# Patient Record
Sex: Male | Born: 1960
Health system: Southern US, Community
[De-identification: ages and names within clinical notes are randomized; demographics above are authoritative.]

## PROBLEM LIST (undated history)

## (undated) DIAGNOSIS — F1910 Other psychoactive substance abuse, uncomplicated: Secondary | ICD-10-CM

## (undated) DIAGNOSIS — E785 Hyperlipidemia, unspecified: Secondary | ICD-10-CM

## (undated) DIAGNOSIS — F39 Unspecified mood [affective] disorder: Secondary | ICD-10-CM

## (undated) DIAGNOSIS — K219 Gastro-esophageal reflux disease without esophagitis: Secondary | ICD-10-CM

## (undated) DIAGNOSIS — E119 Type 2 diabetes mellitus without complications: Secondary | ICD-10-CM

## (undated) DIAGNOSIS — M722 Plantar fascial fibromatosis: Secondary | ICD-10-CM

## (undated) DIAGNOSIS — L719 Rosacea, unspecified: Secondary | ICD-10-CM

## (undated) DIAGNOSIS — I82409 Acute embolism and thrombosis of unspecified deep veins of unspecified lower extremity: Secondary | ICD-10-CM

## (undated) DIAGNOSIS — E079 Disorder of thyroid, unspecified: Secondary | ICD-10-CM

## (undated) DIAGNOSIS — I10 Essential (primary) hypertension: Secondary | ICD-10-CM

## (undated) HISTORY — DX: Rosacea, unspecified: L71.9

## (undated) HISTORY — DX: Other psychoactive substance abuse, uncomplicated: F19.10

## (undated) HISTORY — DX: Hyperlipidemia, unspecified: E78.5

## (undated) HISTORY — DX: Plantar fascial fibromatosis: M72.2

## (undated) HISTORY — DX: Disorder of thyroid, unspecified: E07.9

## (undated) HISTORY — DX: Unspecified mood (affective) disorder: F39

## (undated) HISTORY — DX: Type 2 diabetes mellitus without complications: E11.9

## (undated) HISTORY — DX: Acute embolism and thrombosis of unspecified deep veins of unspecified lower extremity: I82.409

## (undated) HISTORY — DX: Gastro-esophageal reflux disease without esophagitis: K21.9

## (undated) HISTORY — DX: Essential (primary) hypertension: I10

---

## 2010-02-03 ENCOUNTER — Emergency Department (HOSPITAL_COMMUNITY): Admission: EM | Admit: 2010-02-03 | Discharge: 2010-02-03 | Payer: Self-pay | Admitting: Family Medicine

## 2010-02-04 ENCOUNTER — Inpatient Hospital Stay (HOSPITAL_COMMUNITY): Admission: EM | Admit: 2010-02-04 | Discharge: 2010-02-09 | Payer: Self-pay | Admitting: Emergency Medicine

## 2011-03-20 LAB — GLUCOSE, CAPILLARY
Glucose-Capillary: 108 mg/dL — ABNORMAL HIGH (ref 70–99)
Glucose-Capillary: 110 mg/dL — ABNORMAL HIGH (ref 70–99)
Glucose-Capillary: 115 mg/dL — ABNORMAL HIGH (ref 70–99)
Glucose-Capillary: 120 mg/dL — ABNORMAL HIGH (ref 70–99)
Glucose-Capillary: 122 mg/dL — ABNORMAL HIGH (ref 70–99)
Glucose-Capillary: 123 mg/dL — ABNORMAL HIGH (ref 70–99)
Glucose-Capillary: 123 mg/dL — ABNORMAL HIGH (ref 70–99)
Glucose-Capillary: 133 mg/dL — ABNORMAL HIGH (ref 70–99)
Glucose-Capillary: 133 mg/dL — ABNORMAL HIGH (ref 70–99)
Glucose-Capillary: 134 mg/dL — ABNORMAL HIGH (ref 70–99)
Glucose-Capillary: 142 mg/dL — ABNORMAL HIGH (ref 70–99)
Glucose-Capillary: 147 mg/dL — ABNORMAL HIGH (ref 70–99)
Glucose-Capillary: 149 mg/dL — ABNORMAL HIGH (ref 70–99)
Glucose-Capillary: 153 mg/dL — ABNORMAL HIGH (ref 70–99)
Glucose-Capillary: 163 mg/dL — ABNORMAL HIGH (ref 70–99)
Glucose-Capillary: 170 mg/dL — ABNORMAL HIGH (ref 70–99)

## 2011-03-20 LAB — COMPREHENSIVE METABOLIC PANEL
ALT: 207 U/L — ABNORMAL HIGH (ref 0–53)
AST: 100 U/L — ABNORMAL HIGH (ref 0–37)
AST: 153 U/L — ABNORMAL HIGH (ref 0–37)
AST: 80 U/L — ABNORMAL HIGH (ref 0–37)
Albumin: 2.3 g/dL — ABNORMAL LOW (ref 3.5–5.2)
Albumin: 2.4 g/dL — ABNORMAL LOW (ref 3.5–5.2)
Alkaline Phosphatase: 146 U/L — ABNORMAL HIGH (ref 39–117)
Alkaline Phosphatase: 280 U/L — ABNORMAL HIGH (ref 39–117)
BUN: 4 mg/dL — ABNORMAL LOW (ref 6–23)
BUN: 6 mg/dL (ref 6–23)
CO2: 24 mEq/L (ref 19–32)
CO2: 26 mEq/L (ref 19–32)
Calcium: 8.7 mg/dL (ref 8.4–10.5)
Chloride: 102 mEq/L (ref 96–112)
Chloride: 96 mEq/L (ref 96–112)
Chloride: 98 mEq/L (ref 96–112)
Creatinine, Ser: 0.83 mg/dL (ref 0.4–1.5)
Creatinine, Ser: 0.84 mg/dL (ref 0.4–1.5)
Creatinine, Ser: 1.66 mg/dL — ABNORMAL HIGH (ref 0.4–1.5)
GFR calc Af Amer: >60 mL/min (ref >60)
GFR calc non Af Amer: 49 mL/min — ABNORMAL LOW (ref >60)
Glucose, Bld: 133 mg/dL — ABNORMAL HIGH (ref 70–99)
Potassium: 3.8 mEq/L (ref 3.5–5.1)
Potassium: 3.8 mEq/L (ref 3.5–5.1)
Sodium: 131 mEq/L — ABNORMAL LOW (ref 135–145)
Total Bilirubin: 0.7 mg/dL (ref 0.3–1.2)
Total Bilirubin: 1.5 mg/dL — ABNORMAL HIGH (ref 0.3–1.2)
Total Protein: 6.3 g/dL (ref 6.0–8.3)
Total Protein: 6.7 g/dL (ref 6.0–8.3)

## 2011-03-20 LAB — GAMMA GT: GGT: 462 U/L — ABNORMAL HIGH (ref 7–51)

## 2011-03-20 LAB — URINALYSIS, ROUTINE W REFLEX MICROSCOPIC
Glucose, UA: NEGATIVE mg/dL
Ketones, ur: NEGATIVE mg/dL
Nitrite: NEGATIVE
Protein, ur: 100 mg/dL — AB
Specific Gravity, Urine: 1.018 (ref 1.005–1.030)
Urobilinogen, UA: 1 mg/dL (ref 0.0–1.0)
Urobilinogen, UA: 1 mg/dL (ref 0.0–1.0)
pH: 5.5 (ref 5.0–8.0)

## 2011-03-20 LAB — CULTURE, BLOOD (ROUTINE X 2)

## 2011-03-20 LAB — CBC
HCT: 27.9 % — ABNORMAL LOW (ref 39.0–52.0)
HCT: 27.9 % — ABNORMAL LOW (ref 39.0–52.0)
Hemoglobin: 10.2 g/dL — ABNORMAL LOW (ref 13.0–17.0)
Hemoglobin: 10.6 g/dL — ABNORMAL LOW (ref 13.0–17.0)
Hemoglobin: 13.2 g/dL (ref 13.0–17.0)
MCHC: 34.3 g/dL (ref 30.0–36.0)
MCHC: 34.5 g/dL (ref 30.0–36.0)
MCV: 95.9 fL (ref 78.0–100.0)
MCV: 96.2 fL (ref 78.0–100.0)
Platelets: 112 10*3/uL — ABNORMAL LOW (ref 150–400)
Platelets: 152 10*3/uL (ref 150–400)
Platelets: 255 10*3/uL (ref 150–400)
Platelets: 293 10*3/uL (ref 150–400)
RBC: 3.21 MIL/uL — ABNORMAL LOW (ref 4.22–5.81)
RBC: 3.97 MIL/uL — ABNORMAL LOW (ref 4.22–5.81)
RDW: 14.2 % (ref 11.5–15.5)
RDW: 14.4 % (ref 11.5–15.5)
RDW: 14.4 % (ref 11.5–15.5)
RDW: 14.6 % (ref 11.5–15.5)
WBC: 10.3 10*3/uL (ref 4.0–10.5)
WBC: 14.8 10*3/uL — ABNORMAL HIGH (ref 4.0–10.5)
WBC: 8.1 10*3/uL (ref 4.0–10.5)
WBC: 8.1 10*3/uL (ref 4.0–10.5)

## 2011-03-20 LAB — URINE MICROSCOPIC-ADD ON

## 2011-03-20 LAB — HEMOGLOBIN A1C
Hgb A1c MFr Bld: 7.4 % — ABNORMAL HIGH (ref 4.6–6.1)
Mean Plasma Glucose: 166 mg/dL

## 2011-03-20 LAB — LIPID PANEL
Cholesterol: 141 mg/dL (ref 0–200)
LDL Cholesterol: 73 mg/dL (ref 0–99)
Total CHOL/HDL Ratio: 3.1 RATIO
Triglycerides: 109 mg/dL (ref <150)
Triglycerides: 87 mg/dL (ref <150)
VLDL: 17 mg/dL (ref 0–40)
VLDL: 22 mg/dL (ref 0–40)

## 2011-03-20 LAB — CERULOPLASMIN: Ceruloplasmin: 51 mg/dL (ref 21–63)

## 2011-03-20 LAB — BASIC METABOLIC PANEL
BUN: 6 mg/dL (ref 6–23)
Calcium: 8.8 mg/dL (ref 8.4–10.5)
Calcium: 9.1 mg/dL (ref 8.4–10.5)
GFR calc Af Amer: >60 mL/min (ref >60)
GFR calc non Af Amer: >60 mL/min (ref >60)
GFR calc non Af Amer: >60 mL/min (ref >60)
Glucose, Bld: 128 mg/dL — ABNORMAL HIGH (ref 70–99)
Sodium: 130 mEq/L — ABNORMAL LOW (ref 135–145)
Sodium: 132 mEq/L — ABNORMAL LOW (ref 135–145)

## 2011-03-20 LAB — URINE CULTURE: Culture: NO GROWTH

## 2011-03-20 LAB — AMYLASE: Amylase: 56 U/L (ref 0–105)

## 2011-03-20 LAB — TRYPSINOGEN, BLOOD

## 2011-03-20 LAB — HEPATIC FUNCTION PANEL
AST: 127 U/L — ABNORMAL HIGH (ref 0–37)
AST: 144 U/L — ABNORMAL HIGH (ref 0–37)
Albumin: 2.5 g/dL — ABNORMAL LOW (ref 3.5–5.2)
Albumin: 2.7 g/dL — ABNORMAL LOW (ref 3.5–5.2)
Total Bilirubin: 1.5 mg/dL — ABNORMAL HIGH (ref 0.3–1.2)
Total Protein: 7.5 g/dL (ref 6.0–8.3)

## 2011-03-20 LAB — T4, FREE: Free T4: 1.03 ng/dL (ref 0.80–1.80)

## 2011-03-20 LAB — DIFFERENTIAL
Lymphocytes Relative: 15 % (ref 12–46)
Monocytes Relative: 8 % (ref 3–12)
Neutro Abs: 11.4 10*3/uL — ABNORMAL HIGH (ref 1.7–7.7)
Neutrophils Relative %: 77 % (ref 43–77)

## 2011-03-20 LAB — FERRITIN: Ferritin: 1373 ng/mL — ABNORMAL HIGH (ref 22–322)

## 2011-03-20 LAB — LIPASE, BLOOD: Lipase: 35 U/L (ref 11–59)

## 2012-12-29 HISTORY — PX: PACEMAKER INSERTION: SHX728

## 2014-09-18 ENCOUNTER — Emergency Department (HOSPITAL_COMMUNITY)
Admission: EM | Admit: 2014-09-18 | Discharge: 2014-09-18 | Disposition: A | Discharge status: Home / Self Care | Payer: Medicaid Other | Attending: Emergency Medicine | Admitting: Emergency Medicine

## 2014-09-18 ENCOUNTER — Emergency Department (HOSPITAL_COMMUNITY): Payer: Medicaid Other

## 2014-09-18 DIAGNOSIS — Z9119 Patient's noncompliance with other medical treatment and regimen: Secondary | ICD-10-CM | POA: Insufficient documentation

## 2014-09-18 DIAGNOSIS — R0602 Shortness of breath: Secondary | ICD-10-CM | POA: Insufficient documentation

## 2014-09-18 DIAGNOSIS — Z7901 Long term (current) use of anticoagulants: Secondary | ICD-10-CM | POA: Insufficient documentation

## 2014-09-18 DIAGNOSIS — Z79899 Other long term (current) drug therapy: Secondary | ICD-10-CM | POA: Diagnosis not present

## 2014-09-18 DIAGNOSIS — Z9114 Patient's other noncompliance with medication regimen: Secondary | ICD-10-CM

## 2014-09-18 DIAGNOSIS — R7309 Other abnormal glucose: Secondary | ICD-10-CM | POA: Diagnosis not present

## 2014-09-18 DIAGNOSIS — I158 Other secondary hypertension: Secondary | ICD-10-CM | POA: Diagnosis not present

## 2014-09-18 DIAGNOSIS — I159 Secondary hypertension, unspecified: Secondary | ICD-10-CM

## 2014-09-18 DIAGNOSIS — Z91148 Patient's other noncompliance with medication regimen for other reason: Secondary | ICD-10-CM

## 2014-09-18 DIAGNOSIS — Z880 Allergy status to penicillin: Secondary | ICD-10-CM | POA: Insufficient documentation

## 2014-09-18 DIAGNOSIS — R739 Hyperglycemia, unspecified: Secondary | ICD-10-CM

## 2014-09-18 DIAGNOSIS — Z91199 Patient's noncompliance with other medical treatment and regimen due to unspecified reason: Secondary | ICD-10-CM | POA: Insufficient documentation

## 2014-09-18 DIAGNOSIS — Z794 Long term (current) use of insulin: Secondary | ICD-10-CM | POA: Diagnosis not present

## 2014-09-18 LAB — COMPREHENSIVE METABOLIC PANEL
ALK PHOS: 444 U/L — AB (ref 39–117)
ALT: 36 U/L (ref 0–53)
ANION GAP: 10 (ref 5–15)
AST: 47 U/L — ABNORMAL HIGH (ref 0–37)
Albumin: 3.3 g/dL — ABNORMAL LOW (ref 3.5–5.2)
BILIRUBIN TOTAL: 0.6 mg/dL (ref 0.3–1.2)
BUN: 9 mg/dL (ref 6–23)
CHLORIDE: 99 meq/L (ref 96–112)
CO2: 26 meq/L (ref 19–32)
Calcium: 8.8 mg/dL (ref 8.4–10.5)
Creatinine, Ser: 0.68 mg/dL (ref 0.50–1.35)
GFR calc non Af Amer: >90 mL/min (ref >90)
GLUCOSE: 241 mg/dL — AB (ref 70–99)
POTASSIUM: 3.6 meq/L — AB (ref 3.7–5.3)
Sodium: 135 mEq/L — ABNORMAL LOW (ref 137–147)
Total Protein: 6.9 g/dL (ref 6.0–8.3)

## 2014-09-18 LAB — URINALYSIS, ROUTINE W REFLEX MICROSCOPIC
Bilirubin Urine: NEGATIVE
HGB URINE DIPSTICK: NEGATIVE
KETONES UR: NEGATIVE mg/dL
Leukocytes, UA: NEGATIVE
Nitrite: NEGATIVE
PROTEIN: NEGATIVE mg/dL
Specific Gravity, Urine: 1.028 (ref 1.005–1.030)
Urobilinogen, UA: 0.2 mg/dL (ref 0.0–1.0)
pH: 6 (ref 5.0–8.0)

## 2014-09-18 LAB — CBC
HEMATOCRIT: 32.9 % — AB (ref 39.0–52.0)
HEMOGLOBIN: 11.6 g/dL — AB (ref 13.0–17.0)
MCH: 32.3 pg (ref 26.0–34.0)
MCHC: 35.3 g/dL (ref 30.0–36.0)
MCV: 91.6 fL (ref 78.0–100.0)
Platelets: 175 10*3/uL (ref 150–400)
RBC: 3.59 MIL/uL — AB (ref 4.22–5.81)
RDW: 13.6 % (ref 11.5–15.5)
WBC: 4.5 10*3/uL (ref 4.0–10.5)

## 2014-09-18 LAB — I-STAT TROPONIN, ED: Troponin i, poc: 0 ng/mL (ref 0.00–0.08)

## 2014-09-18 LAB — URINE MICROSCOPIC-ADD ON

## 2014-09-18 MED ORDER — BLOOD GLUCOSE METER KIT: PACK | Status: DC

## 2014-09-18 MED ORDER — INSULIN GLARGINE 100 UNIT/ML SOLOSTAR PEN: 25.0000 [IU] | PEN_INJECTOR | Freq: Every day | SUBCUTANEOUS | Status: DC

## 2014-09-18 MED ORDER — AMLODIPINE BESYLATE 2.5 MG PO TABS: 5.0000 mg | ORAL_TABLET | Freq: Every day | ORAL | Status: DC

## 2014-09-18 MED ORDER — METFORMIN HCL ER (MOD) 1000 MG PO TB24: 1000.0000 mg | ORAL_TABLET | Freq: Two times a day (BID) | ORAL | Status: DC

## 2014-09-18 MED ORDER — OXYCODONE-ACETAMINOPHEN 5-325 MG PO TABS
1.0000 | ORAL_TABLET | ORAL | Status: DC | PRN
Start: 2014-09-18 — End: 2014-09-25

## 2014-09-18 MED ORDER — MIRTAZAPINE 15 MG PO TABS: 7.5000 mg | ORAL_TABLET | Freq: Every day | ORAL | Status: DC

## 2014-09-18 MED ORDER — SODIUM CHLORIDE 0.9 % IV BOLUS (SEPSIS)
1000.0000 mL | Freq: Once | INTRAVENOUS | Status: AC
  Administered 2014-09-18: 1000 mL via INTRAVENOUS

## 2014-09-18 MED ORDER — SERTRALINE HCL 100 MG PO TABS: 100.0000 mg | ORAL_TABLET | Freq: Every day | ORAL | Status: DC

## 2014-09-18 MED ORDER — OMEGA 3 1000 MG PO CAPS: 2000.0000 mg | ORAL_CAPSULE | Freq: Every day | ORAL | Status: DC

## 2014-09-18 MED ORDER — INSULIN ASPART 100 UNIT/ML FLEXPEN: 20.0000 [IU] | PEN_INJECTOR | Freq: Three times a day (TID) | SUBCUTANEOUS | Status: DC

## 2014-09-18 MED ORDER — WARFARIN SODIUM 2 MG PO TABS: 6.0000 mg | ORAL_TABLET | Freq: Every day | ORAL | Status: DC

## 2014-09-18 MED ORDER — LOSARTAN POTASSIUM 50 MG PO TABS: 50.0000 mg | ORAL_TABLET | Freq: Every day | ORAL | Status: DC

## 2014-09-18 MED ORDER — VITAMIN D 1000 UNITS PO TABS: 2000.0000 [IU] | ORAL_TABLET | Freq: Every day | ORAL | Status: DC

## 2014-09-18 MED ORDER — LEVOTHYROXINE SODIUM 150 MCG PO TABS: 150.0000 ug | ORAL_TABLET | Freq: Every day | ORAL | Status: DC

## 2014-09-18 MED ORDER — OXYCODONE-ACETAMINOPHEN 5-325 MG PO TABS
1.0000 | ORAL_TABLET | Freq: Once | ORAL | Status: AC
  Administered 2014-09-18: 1 via ORAL
  Filled 2014-09-18: qty 1

## 2014-09-18 MED ORDER — ATORVASTATIN CALCIUM 40 MG PO TABS: 80.0000 mg | ORAL_TABLET | Freq: Every day | ORAL | Status: DC

## 2014-09-18 MED ORDER — GABAPENTIN 100 MG PO CAPS: 300.0000 mg | ORAL_CAPSULE | Freq: Three times a day (TID) | ORAL | Status: DC

## 2014-09-18 MED ORDER — FENOFIBRATE 145 MG PO TABS: 145.0000 mg | ORAL_TABLET | Freq: Every day | ORAL | Status: DC

## 2014-09-18 MED ORDER — OMEPRAZOLE 20 MG PO CPDR: 20.0000 mg | DELAYED_RELEASE_CAPSULE | Freq: Every day | ORAL | Status: DC

## 2014-09-18 NOTE — ED Notes (Signed)
Has misplaced his med x 1 month sob since yesterday , bp meds coumadin insulin

## 2014-09-18 NOTE — ED Notes (Signed)
Pt from home, recently relocated from [place] and airport lost pt luggage containing all documents and medications. Pt has not been able to refill any meds since 08/29/14. Pt also reports sob that started yesterday, states gets worse with exertion. Pt also reports left sided cp whenever dyspnea happens. No radiation, denies any n/v/d/fevers. nad noted. Pt ambulatory from triage with no difficulty. Axo x4.

## 2014-09-18 NOTE — Discharge Instructions (Signed)
Go to the [facility], now, to get your medications. Also, go to the [facility] on Monday at noon for her scheduled appointment with Dr.   Hyperglycemia Hyperglycemia occurs when the glucose (sugar) in your blood is too high. Hyperglycemia can happen for many reasons, but it most often happens to people who do not know they have diabetes or are not managing their diabetes properly.  CAUSES  Whether you have diabetes or not, there are other causes of hyperglycemia. Hyperglycemia can occur when you have diabetes, but it can also occur in other situations that you might not be as aware of, such as: Diabetes  If you have diabetes and are having problems controlling your blood glucose, hyperglycemia could occur because of some of the following reasons:  Not following your meal plan.  Not taking your diabetes medications or not taking it properly.  Exercising less or doing less activity than you normally do.  Being sick. Pre-diabetes  This cannot be ignored. Before people develop Type 2 diabetes, they almost always have "pre-diabetes." This is when your blood glucose levels are higher than normal, but not yet high enough to be diagnosed as diabetes. Research has shown that some long-term damage to the body, especially the heart and circulatory system, may already be occurring during pre-diabetes. If you take action to manage your blood glucose when you have pre-diabetes, you may delay or prevent Type 2 diabetes from developing. Stress  If you have diabetes, you may be "diet" controlled or on oral medications or insulin to control your diabetes. However, you may find that your blood glucose is higher than usual in the hospital whether you have diabetes or not. This is often referred to as "stress hyperglycemia." Stress can elevate your blood glucose. This happens because of hormones put out by the body during times of stress. If stress has been the cause of your high blood glucose, it can  be followed regularly by your caregiver. That way he/she can make sure your hyperglycemia does not continue to get worse or progress to diabetes. Steroids  Steroids are medications that act on the infection fighting system (immune system) to block inflammation or infection. One side effect can be a rise in blood glucose. Most people can produce enough extra insulin to allow for this rise, but for those who cannot, steroids make blood glucose levels go even higher. It is not unusual for steroid treatments to "uncover" diabetes that is developing. It is not always possible to determine if the hyperglycemia will go away after the steroids are stopped. A special blood test called an A1c is sometimes done to determine if your blood glucose was elevated before the steroids were started. SYMPTOMS  Thirsty.  Frequent urination.  Dry mouth.  Blurred vision.  Tired or fatigue.  Weakness.  Sleepy.  Tingling in feet or leg. DIAGNOSIS  Diagnosis is made by monitoring blood glucose in one or all of the following ways:  A1c test. This is a chemical found in your blood.  Fingerstick blood glucose monitoring.  Laboratory results. TREATMENT  First, knowing the cause of the hyperglycemia is important before the hyperglycemia can be treated. Treatment may include, but is not be limited to:  Education.  Change or adjustment in medications.  Change or adjustment in meal plan.  Treatment for an illness, infection, etc.  More frequent blood glucose monitoring.  Change in exercise plan.  Decreasing or stopping steroids.  Lifestyle changes. HOME CARE INSTRUCTIONS   Test your blood glucose  as directed.  Exercise regularly. Your caregiver will give you instructions about exercise. Pre-diabetes or diabetes which comes on with stress is helped by exercising.  Eat wholesome, balanced meals. Eat often and at regular, fixed times. Your caregiver or nutritionist will give you a meal plan to guide  your sugar intake.  Being at an ideal weight is important. If needed, losing as little as 10 to 15 pounds may help improve blood glucose levels. SEEK MEDICAL CARE IF:   You have questions about medicine, activity, or diet.  You continue to have symptoms (problems such as increased thirst, urination, or weight gain). SEEK IMMEDIATE MEDICAL CARE IF:   You are vomiting or have diarrhea.  Your breath smells fruity.  You are breathing faster or slower.  You are very sleepy or incoherent.  You have numbness, tingling, or pain in your feet or hands.  You have chest pain.  Your symptoms get worse even though you have been following your caregiver's orders.  If you have any other questions or concerns. Document Released: 06/10/2001 Document Revised: 03/08/2012 Document Reviewed: 04/12/2012 [facility] Patient Information 2015 [place], [place]. This information is not intended to replace advice given to you by your health care provider. Make sure you discuss any questions you have with your health care provider.  Hypertension Hypertension, commonly called high blood pressure, is when the force of blood pumping through your arteries is too strong. Your arteries are the blood vessels that carry blood from your heart throughout your body. A blood pressure reading consists of a higher number over a lower number, such as 110/72. The higher number (systolic) is the pressure inside your arteries when your heart pumps. The lower number (diastolic) is the pressure inside your arteries when your heart relaxes. Ideally you want your blood pressure below 120/80. Hypertension forces your heart to work harder to pump blood. Your arteries may become narrow or stiff. Having hypertension puts you at risk for heart disease, stroke, and other problems.  RISK FACTORS Some risk factors for high blood pressure are controllable. Others are not.  Risk factors you cannot control include:   Race. You may be at higher  risk if you are African American.  Age. Risk increases with age.  Gender. Men are at higher risk than women before age 85 years. After age 47, women are at higher risk than men. Risk factors you can control include:  Not getting enough exercise or physical activity.  Being overweight.  Getting too much fat, sugar, calories, or salt in your diet.  Drinking too much alcohol. SIGNS AND SYMPTOMS Hypertension does not usually cause signs or symptoms. Extremely high blood pressure (hypertensive crisis) may cause headache, anxiety, shortness of breath, and nosebleed. DIAGNOSIS  To check if you have hypertension, your health care provider will measure your blood pressure while you are seated, with your arm held at the level of your heart. It should be measured at least twice using the same arm. Certain conditions can cause a difference in blood pressure between your right and left arms. A blood pressure reading that is higher than normal on one occasion does not mean that you need treatment. If one blood pressure reading is high, ask your health care provider about having it checked again. TREATMENT  Treating high blood pressure includes making lifestyle changes and possibly taking medicine. Living a healthy lifestyle can help lower high blood pressure. You may need to change some of your habits. Lifestyle changes may include:  Following the [facility]  diet. This diet is high in fruits, vegetables, and whole grains. It is low in salt, red meat, and added sugars.  Getting at least 2 hours of brisk physical activity every week.  Losing weight if necessary.  Not smoking.  Limiting alcoholic beverages.  Learning ways to reduce stress. If lifestyle changes are not enough to get your blood pressure under control, your health care provider may prescribe medicine. You may need to take more than one. Work closely with your health care provider to understand the risks and benefits. HOME CARE  INSTRUCTIONS  Have your blood pressure rechecked as directed by your health care provider.   Take medicines only as directed by your health care provider. Follow the directions carefully. Blood pressure medicines must be taken as prescribed. The medicine does not work as well when you skip doses. Skipping doses also puts you at risk for problems.   Do not smoke.   Monitor your blood pressure at home as directed by your health care provider. SEEK MEDICAL CARE IF:   You think you are having a reaction to medicines taken.  You have recurrent headaches or feel dizzy.  You have swelling in your ankles.  You have trouble with your vision. SEEK IMMEDIATE MEDICAL CARE IF:  You develop a severe headache or confusion.  You have unusual weakness, numbness, or feel faint.  You have severe chest or abdominal pain.  You vomit repeatedly.  You have trouble breathing. MAKE SURE YOU:   Understand these instructions.  Will watch your condition.  Will get help right away if you are not doing well or get worse. Document Released: 12/15/2005 Document Revised: 05/01/2014 Document Reviewed: 10/07/2013 [facility] Patient Information 2015 [place], [place]. This information is not intended to replace advice given to you by your health care provider. Make sure you discuss any questions you have with your health care provider.

## 2014-09-18 NOTE — Progress Notes (Signed)
  CARE MANAGEMENT ED NOTE 09/18/2014  Patient:  Patrick Small, Patrick Small   Account Number:  [id number]  Date Initiated:  09/18/2014  Documentation initiated by:  Edd Arbour  Subjective/Objective Assessment:   53 yr old self [place] without insurance nor pcp c/o for evaluation of help getting medication. his medications, were lost, 08/29/2014, as he was traveling to [place]. Since then his Medicaid case has been     Subjective/Objective Assessment Detail:   closed in [place], where he lives and he is unable to get refills on his prescriptions.    seen by [facility] staff Refer to notes     Action/Plan:   cm contacted by EDP wentz who spoke with [facility] staff to provide services to pt   Action/Plan Detail:   Anticipated DC Date:  09/18/2014     Status Recommendation to Physician:   Result of Recommendation:    Other ED Services  Consult Working Plan    DC Planning Services  CM consult  Medication Assistance  [facility] / [facility] (established/new)    Choice offered to / List presented to:            Status of service:  Completed, signed off  ED Comments:   ED Comments Detail:

## 2014-09-18 NOTE — ED Provider Notes (Signed)
CSN: [id number]     Arrival date & time 09/18/14  1043 History   First MD Initiated Contact with Patient 09/18/14 1346     Chief Complaint  Patient presents with  . Shortness of Breath     (Consider location/radiation/quality/duration/timing/severity/associated sxs/prior Treatment) HPI  Patrick Small is a 53 y.o. male who presents for evaluation of help getting medication. He, states that his medications, were lost, 08/29/2014, as he was traveling to [place]. Since then his Medicaid case has been closed in [place], where he lives and he is unable to get refills on his prescriptions. He, states that he lives with his niece, and is working on Engineer, mining in [place]. Until that time. He has no access to care, and no way to get his medications. He is concerned about his chronic low back pain, for which he takes oxycodone. 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 He denies headaches, chest pain, weakness, dizziness, nausea, or vomiting. He is not taking any of his prescribed medications. No other known modifying factors.    No past medical history on file. No past surgical history on file. No family history on file. History  Substance Use Topics  . Smoking status: Not on file  . Smokeless tobacco: Not on file  . Alcohol Use: Not on file    Review of Systems  All other systems reviewed and are negative.     Allergies  Penicillins; Bacitracin; and Septra  Home Medications   Prior to Admission medications   Medication Sig Start Date End Date Taking? Authorizing Provider  lidocaine (LIDODERM) 5 % Place 1-3 patches onto the skin daily. Remove & Discard patch within 12 hours or as directed by MD   Yes Historical Provider, MD  Melatonin 3 MG TABS Take 6-9 mg by mouth at bedtime as needed (sleep).   Yes Historical Provider, MD  mometasone (ELOCON) 0.1 % cream Apply 1 application topically 2 (two) times daily as needed (inflammation).     Yes Historical Provider, MD  nicotine (NICOTROL) 10 MG inhaler Inhale 1 continuous puffing into the lungs as needed for smoking cessation.   Yes Historical Provider, MD  amLODipine (NORVASC) 2.5 MG tablet Take 2 tablets (5 mg total) by mouth daily. 09/18/14   Richarda Blade, MD  atorvastatin (LIPITOR) 40 MG tablet Take 2 tablets (80 mg total) by mouth daily. 09/18/14   Richarda Blade, MD  Blood Glucose Monitoring Suppl (BLOOD GLUCOSE METER KIT AND SUPPLIES) Dispense based on patient and insurance preference. Use up to four times daily as directed. (FOR ICD-9 250.00, 250.01). 09/18/14   Richarda Blade, MD  cholecalciferol (VITAMIN D) 1000 UNITS tablet Take 2 tablets (2,000 Units total) by mouth daily. 09/18/14   Richarda Blade, MD  fenofibrate (TRICOR) 145 MG tablet Take 1 tablet (145 mg total) by mouth daily. 09/18/14   Richarda Blade, MD  gabapentin (NEURONTIN) 100 MG capsule Take 3 capsules (300 mg total) by mouth 3 (three) times daily. 09/18/14   Richarda Blade, MD  insulin aspart (NOVOLOG) 100 UNIT/ML FlexPen Inject 20 Units into the skin 3 (three) times daily with meals. 09/18/14   Richarda Blade, MD  Insulin Glargine (LANTUS SOLOSTAR) 100 UNIT/ML Solostar Pen Inject 25 Units into the skin daily. 09/18/14   Richarda Blade, MD  levothyroxine (SYNTHROID, LEVOTHROID) 150 MCG tablet Take 1 tablet (150 mcg total) by mouth daily before breakfast. 09/18/14   Richarda Blade, MD  losartan (COZAAR) 50 MG tablet Take 1 tablet (50 mg total) by mouth daily. 09/18/14   Richarda Blade, MD  metFORMIN (GLUMETZA) 1000 MG (MOD) 24 hr tablet Take 1 tablet (1,000 mg total) by mouth 2 (two) times daily with a meal. 09/18/14   Richarda Blade, MD  mirtazapine (REMERON) 15 MG tablet Take 0.5 tablets (7.5 mg total) by mouth at bedtime. 09/18/14   Richarda Blade, MD  Omega 3 1000 MG CAPS Take 2 capsules (2,000 mg total) by mouth daily. 09/18/14   Richarda Blade, MD  omeprazole (PRILOSEC) 20 MG capsule Take 1 capsule (20 mg  total) by mouth daily. 09/18/14   Richarda Blade, MD  oxyCODONE-acetaminophen (PERCOCET) 5-325 MG per tablet Take 1 tablet by mouth every 4 (four) hours as needed for severe pain. 09/18/14   Richarda Blade, MD  sertraline (ZOLOFT) 100 MG tablet Take 1 tablet (100 mg total) by mouth daily. 09/18/14   Richarda Blade, MD  warfarin (COUMADIN) 2 MG tablet Take 3-4 tablets (6-8 mg total) by mouth daily. Take 6 mg on M,W,F and 73m on all other days 09/18/14   ERicharda Blade MD   BP 175/129  Pulse 62  Temp(Src) 98 F (36.7 C) (Oral)  Resp 15  SpO2 100% Physical Exam  Nursing note and vitals reviewed. Constitutional: He is oriented to person, place, and time. He appears well-developed and well-nourished. No distress.  HENT:  Head: Normocephalic and atraumatic.  Right Ear: External ear normal.  Left Ear: External ear normal.  Eyes: Conjunctivae and EOM are normal. Pupils are equal, round, and reactive to light.  Neck: Normal range of motion and phonation normal. Neck supple.  Cardiovascular: Normal rate, regular rhythm and normal heart sounds.   Pulmonary/Chest: Effort normal and breath sounds normal. He exhibits no bony tenderness.  Abdominal: Soft. There is no tenderness.  Musculoskeletal: Normal range of motion.  Low back pain to palpation. Normal range of motion of the low back.  Neurological: He is alert and oriented to person, place, and time. No cranial nerve deficit or sensory deficit. He exhibits normal muscle tone. Coordination normal.  Skin: Skin is warm, dry and intact.  Psychiatric: He has a normal mood and affect. His behavior is normal. Judgment and thought content normal.    ED Course  Procedures (including critical care time) Medications  sodium chloride 0.9 % bolus 1,000 mL (0 mLs Intravenous Stopped 09/18/14 1601)  oxyCODONE-acetaminophen (PERCOCET/ROXICET) 5-325 MG per tablet 1 tablet (1 tablet Oral Given 09/18/14 1602)    No data found.  15:45- the community liaison,  for the emergency department was able to set him up for followup care at the community wellness clinic. She will also call them to arrange for them to fill his medications, today. He can go there with the prescriptions that I write, and get them filled.  15:05- consultation with case management to arrange a care plan for him and intervene in his needs for medications.     Labs Review Labs Reviewed  CBC - Abnormal; Notable for the following:    RBC 3.59 (*)    Hemoglobin 11.6 (*)    HCT 32.9 (*)    All other components within normal limits  COMPREHENSIVE METABOLIC PANEL - Abnormal; Notable for the following:    Sodium 135 (*)    Potassium 3.6 (*)    Glucose, Bld 241 (*)    Albumin 3.3 (*)    AST 47 (*)  Alkaline Phosphatase 444 (*)    All other components within normal limits  URINALYSIS, ROUTINE W REFLEX MICROSCOPIC - Abnormal; Notable for the following:    Glucose, UA >1000 (*)    All other components within normal limits  URINE MICROSCOPIC-ADD ON  I-STAT TROPOININ, ED    Imaging Review No results found.   EKG Interpretation   Date/Time:  Monday September 18 2014 10:51:13 EDT Ventricular Rate:  62 PR Interval:  116 QRS Duration: 74 QT Interval:  414 QTC Calculation: 420 R Axis:   61 Text Interpretation:  Normal sinus rhythm with sinus arrhythmia Normal ECG  ED PHYSICIAN INTERPRETATION AVAILABLE IN [facility] Confirmed by  TEST, Record ([id number]) on 09/20/2014 7:02:21 AM      MDM   Final diagnoses:  Hyperglycemia  Secondary hypertension, unspecified  Noncompliance with medications    Medical noncompliance, patient stable, without evidence of acute metabolic or infectious problems  Nursing Notes Reviewed/ Care Coordinated Applicable Imaging Reviewed Interpretation of Laboratory Data incorporated into ED treatment  The patient appears reasonably screened and/or stabilized for discharge and I doubt any other medical condition or other [facility] requiring  further screening, evaluation, or treatment in the ED at this time prior to discharge.  Plan: Home Medications- usual, prescriptions written; Home Treatments- rest; return here if the recommended treatment, does not improve the symptoms; Recommended follow up- PCP of choice as soon as possible      Richarda Blade, MD 09/21/14 1746

## 2014-09-18 NOTE — Discharge Planning (Signed)
[facility] Community Liaison  Spoke to patient regarding primary care resources and establishing care with a provider. Patient states he is new to the area and has run out of his medications. Follow up appointment made with the [facility] center for Monday Sept 28,2015 at 12:00pm, pt verbalized understanding of the appointment. Patient was instructed to take his ED discharge medications to the [facility] to get them filled. This liaison called the pharmacy to make staff aware that the patient will be coming over to fill medications. Orange card application and my contact information provided for any future questions or concerns. No other Community Liaison needs identified at this time.

## 2014-09-25 ENCOUNTER — Ambulatory Visit: Payer: Medicaid Other | Attending: Internal Medicine

## 2014-09-25 ENCOUNTER — Ambulatory Visit: Payer: Medicaid Other | Attending: Family Medicine | Admitting: Family Medicine

## 2014-09-25 ENCOUNTER — Encounter: Payer: Self-pay | Admitting: Family Medicine

## 2014-09-25 ENCOUNTER — Ambulatory Visit: Payer: Self-pay | Admitting: Family Medicine

## 2014-09-25 VITALS — BP 131/85 | HR 62 | Temp 99.0°F | Resp 18 | Ht 73.0 in | Wt 199.0 lb

## 2014-09-25 DIAGNOSIS — Z86718 Personal history of other venous thrombosis and embolism: Secondary | ICD-10-CM | POA: Insufficient documentation

## 2014-09-25 DIAGNOSIS — Z794 Long term (current) use of insulin: Secondary | ICD-10-CM | POA: Insufficient documentation

## 2014-09-25 DIAGNOSIS — Z7901 Long term (current) use of anticoagulants: Secondary | ICD-10-CM | POA: Insufficient documentation

## 2014-09-25 DIAGNOSIS — I82409 Acute embolism and thrombosis of unspecified deep veins of unspecified lower extremity: Secondary | ICD-10-CM

## 2014-09-25 DIAGNOSIS — I10 Essential (primary) hypertension: Secondary | ICD-10-CM | POA: Diagnosis not present

## 2014-09-25 DIAGNOSIS — M545 Low back pain, unspecified: Secondary | ICD-10-CM | POA: Diagnosis not present

## 2014-09-25 DIAGNOSIS — M25569 Pain in unspecified knee: Secondary | ICD-10-CM | POA: Diagnosis not present

## 2014-09-25 DIAGNOSIS — Z950 Presence of cardiac pacemaker: Secondary | ICD-10-CM | POA: Insufficient documentation

## 2014-09-25 DIAGNOSIS — G8929 Other chronic pain: Secondary | ICD-10-CM | POA: Diagnosis not present

## 2014-09-25 DIAGNOSIS — I499 Cardiac arrhythmia, unspecified: Secondary | ICD-10-CM | POA: Diagnosis not present

## 2014-09-25 DIAGNOSIS — E1142 Type 2 diabetes mellitus with diabetic polyneuropathy: Secondary | ICD-10-CM | POA: Insufficient documentation

## 2014-09-25 DIAGNOSIS — F17200 Nicotine dependence, unspecified, uncomplicated: Secondary | ICD-10-CM | POA: Diagnosis not present

## 2014-09-25 DIAGNOSIS — H547 Unspecified visual loss: Secondary | ICD-10-CM | POA: Insufficient documentation

## 2014-09-25 DIAGNOSIS — E039 Hypothyroidism, unspecified: Secondary | ICD-10-CM

## 2014-09-25 DIAGNOSIS — E119 Type 2 diabetes mellitus without complications: Secondary | ICD-10-CM | POA: Diagnosis not present

## 2014-09-25 DIAGNOSIS — I82402 Acute embolism and thrombosis of unspecified deep veins of left lower extremity: Secondary | ICD-10-CM

## 2014-09-25 DIAGNOSIS — G589 Mononeuropathy, unspecified: Secondary | ICD-10-CM | POA: Diagnosis not present

## 2014-09-25 DIAGNOSIS — I82401 Acute embolism and thrombosis of unspecified deep veins of right lower extremity: Secondary | ICD-10-CM | POA: Insufficient documentation

## 2014-09-25 DIAGNOSIS — E7800 Pure hypercholesterolemia, unspecified: Secondary | ICD-10-CM | POA: Diagnosis not present

## 2014-09-25 LAB — LIPID PANEL
CHOL/HDL RATIO: 3.9 ratio
CHOLESTEROL: 236 mg/dL — AB (ref 0–200)
HDL: 60 mg/dL (ref >39)
LDL Cholesterol: 154 mg/dL — ABNORMAL HIGH (ref 0–99)
TRIGLYCERIDES: 111 mg/dL (ref <150)
VLDL: 22 mg/dL (ref 0–40)

## 2014-09-25 LAB — BASIC METABOLIC PANEL
BUN: 9 mg/dL (ref 6–23)
CALCIUM: 10 mg/dL (ref 8.4–10.5)
CHLORIDE: 103 meq/L (ref 96–112)
CO2: 28 meq/L (ref 19–32)
CREATININE: 0.8 mg/dL (ref 0.50–1.35)
GLUCOSE: 101 mg/dL — AB (ref 70–99)
Potassium: 3.7 mEq/L (ref 3.5–5.3)
Sodium: 140 mEq/L (ref 135–145)

## 2014-09-25 LAB — POCT GLYCOSYLATED HEMOGLOBIN (HGB A1C): Hemoglobin A1C: 7.8

## 2014-09-25 LAB — POCT INR: INR: 1.7

## 2014-09-25 LAB — GLUCOSE, POCT (MANUAL RESULT ENTRY): POC Glucose: 159 mg/dl — AB (ref 70–99)

## 2014-09-25 MED ORDER — LOSARTAN POTASSIUM 50 MG PO TABS: 50.0000 mg | ORAL_TABLET | Freq: Every day | ORAL | Status: DC

## 2014-09-25 MED ORDER — METFORMIN HCL ER 500 MG PO TB24: 500.0000 mg | ORAL_TABLET | Freq: Every day | ORAL | Status: DC

## 2014-09-25 MED ORDER — WARFARIN SODIUM 2 MG PO TABS: 6.0000 mg | ORAL_TABLET | Freq: Every day | ORAL | Status: DC

## 2014-09-25 MED ORDER — ACETAMINOPHEN-CODEINE #3 300-30 MG PO TABS: 1.0000 | ORAL_TABLET | Freq: Three times a day (TID) | ORAL | Status: DC | PRN

## 2014-09-25 MED ORDER — ATORVASTATIN CALCIUM 40 MG PO TABS: 80.0000 mg | ORAL_TABLET | Freq: Every day | ORAL | Status: DC

## 2014-09-25 MED ORDER — AMLODIPINE BESYLATE 5 MG PO TABS: 5.0000 mg | ORAL_TABLET | Freq: Every day | ORAL | Status: DC

## 2014-09-25 NOTE — Assessment & Plan Note (Signed)
A: well controlled P: Foot exam done today, lipids done today Restart metformin XR Referral to ophthalmology Urine albumin at f/u

## 2014-09-25 NOTE — Patient Instructions (Addendum)
Mr. Guaman,   Thank you for coming in today. It was a pleasure meeting you. I look forward to being your primary doctor.  I will obtain records, review them and scan them into your chart.   For diabetes: your A1c is great, please start metformin XR, less GI upset, 500 mg in the morning. Continue insulin and lipitor.  For hypertension: BP at goal. Continue medications. I will send refills to onsite pharmacy.   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 Daleen Squibb, cardiology in 4-6 weeks, ideally we will have records before you see Dr. Daleen Squibb.    F/u in 4  weeks with me    Dr. Armen Pickup

## 2014-09-25 NOTE — Assessment & Plan Note (Signed)
A:BP at goal PL Continue regimen

## 2014-09-25 NOTE — Progress Notes (Signed)
Establish Care  HFU stated feeling better since he is taking his medication

## 2014-09-25 NOTE — Progress Notes (Addendum)
   Subjective:    Patient ID: Patrick Small, male    DOB: Apr 08, 1961, 53 y.o.   MRN: [id number] CC: establish care, ED f/u, moved back to [place] from [place], [place] and lost medications on the plane.  HPI 53 year old male presents to establish care discussed the following:  #1 medication refill: Patient requires medication refills as he moved back to [facility] to be closer to his mother after living in [place] for 15 years. Patient lost all his medications on the plane during his trip. 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  #2 diabetes: Patient with insulin dependent diabetes mellitus type 2. Patient is compliant with insulin therapy. Patient was not taking metformin due to diarrhea. Patient walks daily. Patient was previously over 300 pounds and lost weight by diet and exercise. 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 Patient also reports decreased vision.  #3 hypertension: Patient history of hypertension. He is on antihypertensives. He denies chest pain, shortness of breath, palpitations.  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 At his second DVT he was told to take chronic anticoagulation. He is compliant with Coumadin 6-8 mg nightly. He denies bleeding, bruising, chest pain, shortness of breath, lower extremity edema, calf pain.  #5 chronic low back pain: Patient history chronic low back pain. Patient reportedly on chronic narcotics. I informed patient that we do not prescribe Vicodin or Percocet in his office. Therefore the patient that for chronic narcotic therapy his best options are pain management. No lower extremity weakness.   #6 history of irregular heartbeat: Patient with a history of irregular heartbeat. He denies A. fib A. flutter. He reports that he had a cardiac pacemaker placed. He  denies syncope, chest pain, short of breath.  Soc hx: current smoker  Review of Systems As per HPI R knee pain x one year  GAD 7 score of 15, 1-1 and 5, 2-4 and 6. 2-2,3,7.     Objective:   Physical Exam BP 131/85  Pulse 62  Temp(Src) 99 F (37.2 C) (Oral)  Resp 18  Ht  (1.854 m)  Wt 199 lb (90.266 kg)  BMI 26.26 kg/m2  SpO2 99% General appearance: alert, cooperative and no distress Lungs: clear to auscultation bilaterally Heart: regular rate and rhythm, S1, S2 normal, no murmur, click, rub or gallop Extremities: extremities normal, atraumatic, no cyanosis or edema  Lab Results  Component Value Date   HGBA1C 7.8 09/25/2014  CBG: 159   INR/Prothrombin Time 1.7      Assessment & Plan:

## 2014-09-25 NOTE — Assessment & Plan Note (Signed)
A: patient with history of DVT x 2, on lifelong anticoagulation with chronic coumadin therapy P: Continue coumadin  F/u in 1 week for repeat INR check, nurse visit  If INR < 2 plan for ~10% weekly dose increase by increasing dose from 50 mg weekly to  54 mg weekly,  so 8 mg all days except Monday. 6 mg on Monday.

## 2014-09-25 NOTE — Assessment & Plan Note (Signed)
A: chronic pain w/o acute flare P: Obtain and review records Tylenol #3

## 2014-09-25 NOTE — Assessment & Plan Note (Signed)
A: history of irregular heartbeat, has a pacemaker. Normal cardiac exam today P: Obtain and review records [facility] house cards referral

## 2014-09-26 DIAGNOSIS — E7800 Pure hypercholesterolemia, unspecified: Secondary | ICD-10-CM | POA: Insufficient documentation

## 2014-09-26 NOTE — Assessment & Plan Note (Signed)
A: high cholesterol. I suspect patient is not compliant with statin P: Reiterated importance of statin Refill Direct LDL in 3-6 months

## 2014-09-28 ENCOUNTER — Telehealth: Payer: Self-pay | Admitting: *Deleted

## 2014-09-28 NOTE — Telephone Encounter (Signed)
Left message to return call 

## 2014-09-28 NOTE — Telephone Encounter (Signed)
Message copied by Dyann KiefGIRALDEZ, Minas Bonser M on Thu Sep 28, 2014 10:36 AM ------      Message from: Dessa PhiFUNCHES, JOSALYN      Created: Tue Sep 26, 2014  5:05 PM       Normal BMP      High cholesterol, be sure to take lipitor 40 and eat a healthy diet ------

## 2014-10-02 ENCOUNTER — Other Ambulatory Visit: Payer: PRIVATE HEALTH INSURANCE | Admitting: Pharmacist

## 2014-10-04 ENCOUNTER — Other Ambulatory Visit: Payer: Self-pay | Admitting: Internal Medicine

## 2014-10-04 MED ORDER — INSULIN GLARGINE 100 UNIT/ML SOLOSTAR PEN: 25.0000 [IU] | PEN_INJECTOR | Freq: Every day | SUBCUTANEOUS | Status: DC

## 2014-10-04 MED ORDER — LEVOTHYROXINE SODIUM 150 MCG PO TABS: 150.0000 ug | ORAL_TABLET | Freq: Every day | ORAL | Status: DC

## 2014-10-04 MED ORDER — INSULIN ASPART 100 UNIT/ML FLEXPEN: 20.0000 [IU] | PEN_INJECTOR | Freq: Three times a day (TID) | SUBCUTANEOUS | Status: DC

## 2014-10-05 ENCOUNTER — Ambulatory Visit: Payer: Medicaid Other | Attending: Family Medicine | Admitting: Pharmacist

## 2014-10-05 ENCOUNTER — Other Ambulatory Visit: Payer: Self-pay | Admitting: Family Medicine

## 2014-10-05 DIAGNOSIS — Z7901 Long term (current) use of anticoagulants: Secondary | ICD-10-CM | POA: Insufficient documentation

## 2014-10-05 DIAGNOSIS — I480 Paroxysmal atrial fibrillation: Secondary | ICD-10-CM | POA: Insufficient documentation

## 2014-10-05 DIAGNOSIS — I82402 Acute embolism and thrombosis of unspecified deep veins of left lower extremity: Secondary | ICD-10-CM

## 2014-10-05 DIAGNOSIS — I82409 Acute embolism and thrombosis of unspecified deep veins of unspecified lower extremity: Secondary | ICD-10-CM

## 2014-10-05 DIAGNOSIS — I4891 Unspecified atrial fibrillation: Secondary | ICD-10-CM | POA: Insufficient documentation

## 2014-10-05 LAB — POCT INR: INR: 1.2

## 2014-10-05 MED ORDER — WARFARIN SODIUM 4 MG PO TABS: 8.0000 mg | ORAL_TABLET | Freq: Every day | ORAL | Status: DC

## 2014-10-12 ENCOUNTER — Ambulatory Visit: Payer: Medicaid Other | Attending: Family Medicine | Admitting: Pharmacist

## 2014-10-12 DIAGNOSIS — I8290 Acute embolism and thrombosis of unspecified vein: Secondary | ICD-10-CM

## 2014-10-12 LAB — POCT INR: INR: 1.2

## 2014-10-18 ENCOUNTER — Ambulatory Visit: Payer: PRIVATE HEALTH INSURANCE | Admitting: Family Medicine

## 2014-10-19 ENCOUNTER — Ambulatory Visit: Payer: Medicaid Other | Attending: Family Medicine | Admitting: Family Medicine

## 2014-10-19 ENCOUNTER — Encounter: Payer: Self-pay | Admitting: Family Medicine

## 2014-10-19 VITALS — BP 135/95 | HR 65 | Temp 98.2°F | Resp 18 | Ht 73.0 in | Wt 208.0 lb

## 2014-10-19 DIAGNOSIS — I10 Essential (primary) hypertension: Secondary | ICD-10-CM

## 2014-10-19 DIAGNOSIS — M545 Low back pain: Secondary | ICD-10-CM

## 2014-10-19 DIAGNOSIS — R739 Hyperglycemia, unspecified: Secondary | ICD-10-CM

## 2014-10-19 DIAGNOSIS — G8929 Other chronic pain: Secondary | ICD-10-CM

## 2014-10-19 DIAGNOSIS — E119 Type 2 diabetes mellitus without complications: Secondary | ICD-10-CM

## 2014-10-19 LAB — GLUCOSE, POCT (MANUAL RESULT ENTRY): POC Glucose: 81 mg/dl (ref 70–99)

## 2014-10-19 MED ORDER — TRAMADOL HCL 50 MG PO TABS: 50.0000 mg | ORAL_TABLET | Freq: Three times a day (TID) | ORAL | Status: DC | PRN

## 2014-10-19 MED ORDER — METFORMIN HCL ER 500 MG PO TB24: 1000.0000 mg | ORAL_TABLET | Freq: Every day | ORAL | Status: DC

## 2014-10-19 MED ORDER — AMLODIPINE BESYLATE 10 MG PO TABS: 10.0000 mg | ORAL_TABLET | Freq: Every day | ORAL | Status: DC

## 2014-10-19 NOTE — Progress Notes (Signed)
   Subjective:    Patient ID: Patrick Small, male    DOB: 07/05/1961, 53 y.o.   MRN: [id number] CC: f/u HTN  HPI 53 yo M presents for f/u visit to discuss the following:  1. HTN: no CP, SOB, dizziness. Taking medications. Eating a low salt diet. Still smoking 1/3 PPD and not ready to quit.   2.  DM2: compliant with meds. Tolerating meds, slight GI upset with metformin. Checks CBGs range 76-450. Admits to peripheral neuropathy. Not taking gabapentin. Denies vision change, polydipsia and polyuria.   3. Chronic low back pain: tylenol #3 did not control pain. No falls or LE weakness. Request different pain medicine.   Soc hx: current smoker, 1/3 PPD, cutting back   Review of Systems As per HPI     Objective:   Physical Exam BP 135/95  Pulse 65  Temp(Src) 98.2 F (36.8 C) (Oral)  Resp 18  Ht 6\' 1"  (1.854 m)  Wt 208 lb (94.348 kg)  BMI 27.45 kg/m2  SpO2 100% BP Readings from Last 3 Encounters:  10/19/14 135/95  09/25/14 131/85  09/18/14 175/129  General appearance: alert, cooperative and no distress Lungs: clear to auscultation bilaterally Heart: regular rate and rhythm, S1, S2 normal, no murmur, click, rub or gallop Ext: no edema  Gait: normal  CBG: 81  Lab Results  Component Value Date   HGBA1C 7.8 09/25/2014        Assessment & Plan:

## 2014-10-19 NOTE — Progress Notes (Signed)
F/U HTN,  Complaining of Feet numbness around toe area

## 2014-10-19 NOTE — Assessment & Plan Note (Signed)
A: still with elevated CBGs Meds: compliant P: Increase metformin to 1000 mg q Am Patient to start gabapentin for neuropathy

## 2014-10-19 NOTE — Assessment & Plan Note (Signed)
A: chronic, previously on opiod pain medicine. Pain not well controlled.  P: D/c tylenol #3 Tramadol

## 2014-10-19 NOTE — Assessment & Plan Note (Signed)
A: improved Meds: compliant  P: Increase norvasc to 10 mg daily.

## 2014-10-19 NOTE — Patient Instructions (Signed)
Mr. Patrick Small,  Thank you for coming in today.  1. HTN: BP improved. Increase norvasc to 10 mg daily. Goal BP < 140/90.   2. DM2: some elevated blood sugars please increase metformin to 1000 mg with breakfast.   F/u in 2-3  weeks for RN blood sugar review and to help you set time and date on meter.  F/u with me in 2 months  Dr. Armen PickupFunches

## 2014-11-02 ENCOUNTER — Other Ambulatory Visit: Payer: Medicaid Other

## 2014-11-10 ENCOUNTER — Ambulatory Visit: Payer: Medicaid Other | Attending: Family Medicine | Admitting: Pharmacist

## 2014-11-10 DIAGNOSIS — Z7901 Long term (current) use of anticoagulants: Secondary | ICD-10-CM

## 2014-11-10 DIAGNOSIS — Z86718 Personal history of other venous thrombosis and embolism: Secondary | ICD-10-CM

## 2014-11-13 ENCOUNTER — Ambulatory Visit: Payer: Medicaid Other | Admitting: Family Medicine

## 2014-11-16 ENCOUNTER — Encounter: Payer: Self-pay | Admitting: Family Medicine

## 2014-11-16 ENCOUNTER — Ambulatory Visit: Payer: Medicaid Other | Attending: Family Medicine | Admitting: Family Medicine

## 2014-11-16 VITALS — BP 152/92 | HR 68 | Temp 98.0°F | Resp 18 | Ht 72.0 in | Wt 208.0 lb

## 2014-11-16 DIAGNOSIS — K029 Dental caries, unspecified: Secondary | ICD-10-CM | POA: Diagnosis not present

## 2014-11-16 DIAGNOSIS — I4891 Unspecified atrial fibrillation: Secondary | ICD-10-CM

## 2014-11-16 DIAGNOSIS — Z9114 Patient's other noncompliance with medication regimen: Secondary | ICD-10-CM | POA: Insufficient documentation

## 2014-11-16 DIAGNOSIS — F32A Depression, unspecified: Secondary | ICD-10-CM

## 2014-11-16 DIAGNOSIS — Z86718 Personal history of other venous thrombosis and embolism: Secondary | ICD-10-CM

## 2014-11-16 DIAGNOSIS — I10 Essential (primary) hypertension: Secondary | ICD-10-CM

## 2014-11-16 DIAGNOSIS — Z7901 Long term (current) use of anticoagulants: Secondary | ICD-10-CM

## 2014-11-16 DIAGNOSIS — Z5181 Encounter for therapeutic drug level monitoring: Secondary | ICD-10-CM

## 2014-11-16 DIAGNOSIS — K047 Periapical abscess without sinus: Secondary | ICD-10-CM

## 2014-11-16 DIAGNOSIS — F329 Major depressive disorder, single episode, unspecified: Secondary | ICD-10-CM | POA: Insufficient documentation

## 2014-11-16 DIAGNOSIS — E039 Hypothyroidism, unspecified: Secondary | ICD-10-CM

## 2014-11-16 LAB — POCT INR: INR: 3.6

## 2014-11-16 LAB — TSH: TSH: 1.089 u[IU]/mL (ref 0.350–4.500)

## 2014-11-16 MED ORDER — WARFARIN SODIUM 2 MG PO TABS: ORAL_TABLET | ORAL | Status: DC

## 2014-11-16 MED ORDER — TRAZODONE HCL 50 MG PO TABS: 25.0000 mg | ORAL_TABLET | Freq: Every evening | ORAL | Status: DC | PRN

## 2014-11-16 MED ORDER — LOSARTAN POTASSIUM-HCTZ 50-12.5 MG PO TABS: 2.0000 | ORAL_TABLET | Freq: Every day | ORAL | Status: DC

## 2014-11-16 NOTE — Progress Notes (Signed)
   Subjective:    Patient ID: Patrick Small, male    DOB: 05/20/1961, 53 y.o.   MRN: [id number] CC: f/u depression, HTN  HPI 53 yo M presents for f/u visit:  1. HTN: non compliant with medications due to cost. No CP or SOB.   2. Dental caries: patient with poor dentition and intermittent swelling. No fever. Symptoms resolve with ibuprofen.   3. DVT hx: on coumadin. No bleeding.   Soc hx: current smoker  Review of Systems As per HPI     Objective:   Physical Exam BP 152/92 mmHg  Pulse 68  Temp(Src) 98 F (36.7 C) (Oral)  Resp 18  Ht 6' (1.829 m)  Wt 208 lb (94.348 kg)  BMI 28.20 kg/m2  SpO2 99%  Wt Readings from Last 3 Encounters:  11/16/14 208 lb (94.348 kg)  10/19/14 208 lb (94.348 kg)  09/25/14 199 lb (90.266 kg)  General appearance: alert, cooperative and no distress Lungs: normal WOB  Mouth: poor dentition with multiple caries    Lab Results  Component Value Date   INR 3.6 11/16/2014   INR 1.2 10/12/2014   INR 1.2 10/05/2014        Assessment & Plan:

## 2014-11-16 NOTE — Assessment & Plan Note (Signed)
A: INR above goal P: Coumadin dose adjustment down by 01%

## 2014-11-16 NOTE — Progress Notes (Signed)
F/u Depression Medicine Refills

## 2014-11-16 NOTE — Patient Instructions (Addendum)
Mr. Patrick Small,  Thank you for coming in today. You will be called with TSH results and adjustments if needed.  You will have your med co-pay zeroed out.   Please STOP: Amlodipine and cozaar. remeron   Please START:  hyzaar- losartan-HCTZ combo Trazodone to take with zoloft to help with mood and sleep.  Pick up your medications: lantus, tramadol, synthroid make sure   F/u INR check in 1 week  See me in f/u in 2-3 weeks   Dr. Armen PickupFunches

## 2014-11-16 NOTE — Assessment & Plan Note (Signed)
A: BP above goal P: Change to Htyzaar 50-12.5 mg two tablets daily

## 2014-11-16 NOTE — Assessment & Plan Note (Signed)
A: non compliant with synthroid P: Check TSH

## 2014-11-17 ENCOUNTER — Encounter: Payer: Medicaid Other | Admitting: Pharmacist

## 2014-11-22 ENCOUNTER — Telehealth: Payer: Self-pay | Admitting: *Deleted

## 2014-11-22 ENCOUNTER — Ambulatory Visit: Payer: Medicaid Other | Attending: Family Medicine | Admitting: Pharmacist

## 2014-11-22 DIAGNOSIS — I82409 Acute embolism and thrombosis of unspecified deep veins of unspecified lower extremity: Secondary | ICD-10-CM | POA: Diagnosis not present

## 2014-11-22 DIAGNOSIS — Z7901 Long term (current) use of anticoagulants: Secondary | ICD-10-CM

## 2014-11-22 DIAGNOSIS — Z86718 Personal history of other venous thrombosis and embolism: Secondary | ICD-10-CM

## 2014-11-22 LAB — POCT INR: INR: 1.1

## 2014-11-22 NOTE — Telephone Encounter (Signed)
Pt aware of lab results 

## 2014-11-22 NOTE — Telephone Encounter (Signed)
-----   Message from Lora PaulaJosalyn C Funches, MD sent at 11/17/2014 10:15 AM EST ----- Normal TSH Continue synthroid at current dose

## 2014-11-29 ENCOUNTER — Encounter: Payer: Medicaid Other | Admitting: Pharmacist

## 2014-12-01 ENCOUNTER — Ambulatory Visit: Payer: Medicaid Other | Admitting: Family Medicine

## 2015-02-20 NOTE — Telephone Encounter (Signed)
Open in error

## 2015-03-12 ENCOUNTER — Encounter: Payer: Self-pay | Admitting: Emergency Medicine

## 2015-03-12 NOTE — Telephone Encounter (Signed)
e

## 2015-03-26 ENCOUNTER — Encounter: Payer: Self-pay | Admitting: Family Medicine

## 2015-03-26 ENCOUNTER — Telehealth: Payer: Self-pay | Admitting: Family Medicine

## 2015-03-26 NOTE — Telephone Encounter (Signed)
[name], Please call patient. Received 220 pages of office notes from patient's previous PCP in [place]. Records reviewed, no pertinent labs or study reports.   Records available for patient pick up. If he does not want them they will be shredded.

## 2015-03-26 NOTE — Progress Notes (Signed)
Patient ID: [name], male   DOB: 10/04/1961, 54 y.o.   MRN: [id number] Received 220 pages of office notes from patient's previous PCP in [place]. Records reviewed, no pertinent labs or study reports.   Records available for patient pick up. If he does not want them they will be shredded.

## 2015-03-27 ENCOUNTER — Telehealth: Payer: Self-pay | Admitting: Family Medicine

## 2015-03-27 NOTE — Telephone Encounter (Signed)
Unable to contact Pt.

## 2015-03-27 NOTE — Telephone Encounter (Signed)
Pt was notified, medical record in our office  Stated will schedule F/U appointment and will pick up records then

## 2015-03-27 NOTE — Telephone Encounter (Signed)
Patient is returning call from nurse, please f/u with pt. °

## 2015-04-03 ENCOUNTER — Ambulatory Visit: Payer: Medicaid Other | Admitting: Family Medicine

## 2015-12-03 ENCOUNTER — Ambulatory Visit: Payer: Medicaid Other | Admitting: Family Medicine

## 2015-12-21 NOTE — Progress Notes (Signed)
I am closing these encounters as I am responsible for the [facility]madin [facility] now and the provider at this time is no longer employed at our facility to close them. I did not provide this service on this date.   Kala Ambriz Karl, PharmD, BCPS, CPP Clinical Pharmacist Practitioner  [facility] [phone]  

## 2015-12-21 NOTE — Progress Notes (Signed)
I am closing these encounters as I am responsible for the Coumadin Clinic now and the provider at this time is no longer employed at our facility to close them. I did not provide this service on this date.   Tayton Decaire Karl, PharmD, BCPS, CPP Clinical Pharmacist Practitioner  [facility] [phone]  

## 2016-02-17 ENCOUNTER — Encounter (HOSPITAL_COMMUNITY): Payer: Self-pay

## 2016-02-17 ENCOUNTER — Emergency Department (HOSPITAL_COMMUNITY): Payer: Medicaid Other

## 2016-02-17 ENCOUNTER — Inpatient Hospital Stay (HOSPITAL_COMMUNITY)
Admission: EM | Admit: 2016-02-17 | Discharge: 2016-02-22 | DRG: 638 | Disposition: A | Discharge status: Home / Self Care | Payer: Medicaid Other | Attending: Internal Medicine | Admitting: Internal Medicine

## 2016-02-17 DIAGNOSIS — F1020 Alcohol dependence, uncomplicated: Secondary | ICD-10-CM | POA: Diagnosis present

## 2016-02-17 DIAGNOSIS — I495 Sick sinus syndrome: Secondary | ICD-10-CM | POA: Insufficient documentation

## 2016-02-17 DIAGNOSIS — N179 Acute kidney failure, unspecified: Secondary | ICD-10-CM | POA: Diagnosis present

## 2016-02-17 DIAGNOSIS — I38 Endocarditis, valve unspecified: Secondary | ICD-10-CM | POA: Diagnosis present

## 2016-02-17 DIAGNOSIS — R631 Polydipsia: Secondary | ICD-10-CM | POA: Diagnosis present

## 2016-02-17 DIAGNOSIS — K759 Inflammatory liver disease, unspecified: Secondary | ICD-10-CM | POA: Diagnosis present

## 2016-02-17 DIAGNOSIS — I10 Essential (primary) hypertension: Secondary | ICD-10-CM | POA: Diagnosis present

## 2016-02-17 DIAGNOSIS — E869 Volume depletion, unspecified: Secondary | ICD-10-CM | POA: Diagnosis present

## 2016-02-17 DIAGNOSIS — G8929 Other chronic pain: Secondary | ICD-10-CM | POA: Diagnosis present

## 2016-02-17 DIAGNOSIS — E11649 Type 2 diabetes mellitus with hypoglycemia without coma: Secondary | ICD-10-CM | POA: Diagnosis present

## 2016-02-17 DIAGNOSIS — I480 Paroxysmal atrial fibrillation: Secondary | ICD-10-CM | POA: Diagnosis present

## 2016-02-17 DIAGNOSIS — R7401 Elevation of levels of liver transaminase levels: Secondary | ICD-10-CM | POA: Insufficient documentation

## 2016-02-17 DIAGNOSIS — M549 Dorsalgia, unspecified: Secondary | ICD-10-CM

## 2016-02-17 DIAGNOSIS — E1165 Type 2 diabetes mellitus with hyperglycemia: Secondary | ICD-10-CM | POA: Diagnosis present

## 2016-02-17 DIAGNOSIS — F329 Major depressive disorder, single episode, unspecified: Secondary | ICD-10-CM | POA: Diagnosis present

## 2016-02-17 DIAGNOSIS — E861 Hypovolemia: Secondary | ICD-10-CM | POA: Diagnosis present

## 2016-02-17 DIAGNOSIS — K029 Dental caries, unspecified: Secondary | ICD-10-CM | POA: Diagnosis present

## 2016-02-17 DIAGNOSIS — R740 Nonspecific elevation of levels of transaminase and lactic acid dehydrogenase [LDH]: Secondary | ICD-10-CM

## 2016-02-17 DIAGNOSIS — E785 Hyperlipidemia, unspecified: Secondary | ICD-10-CM | POA: Diagnosis present

## 2016-02-17 DIAGNOSIS — R358 Other polyuria: Secondary | ICD-10-CM | POA: Diagnosis present

## 2016-02-17 DIAGNOSIS — I4891 Unspecified atrial fibrillation: Secondary | ICD-10-CM | POA: Diagnosis present

## 2016-02-17 DIAGNOSIS — Y831 Surgical operation with implant of artificial internal device as the cause of abnormal reaction of the patient, or of later complication, without mention of misadventure at the time of the procedure: Secondary | ICD-10-CM | POA: Diagnosis present

## 2016-02-17 DIAGNOSIS — I959 Hypotension, unspecified: Secondary | ICD-10-CM | POA: Diagnosis present

## 2016-02-17 DIAGNOSIS — E039 Hypothyroidism, unspecified: Secondary | ICD-10-CM | POA: Diagnosis present

## 2016-02-17 DIAGNOSIS — E860 Dehydration: Secondary | ICD-10-CM | POA: Diagnosis present

## 2016-02-17 DIAGNOSIS — F32A Depression, unspecified: Secondary | ICD-10-CM | POA: Diagnosis present

## 2016-02-17 DIAGNOSIS — Z9114 Patient's other noncompliance with medication regimen: Secondary | ICD-10-CM

## 2016-02-17 DIAGNOSIS — L719 Rosacea, unspecified: Secondary | ICD-10-CM | POA: Diagnosis present

## 2016-02-17 DIAGNOSIS — D696 Thrombocytopenia, unspecified: Secondary | ICD-10-CM | POA: Diagnosis present

## 2016-02-17 DIAGNOSIS — T827XXA Infection and inflammatory reaction due to other cardiac and vascular devices, implants and grafts, initial encounter: Secondary | ICD-10-CM | POA: Diagnosis present

## 2016-02-17 DIAGNOSIS — E872 Acidosis: Secondary | ICD-10-CM | POA: Diagnosis present

## 2016-02-17 DIAGNOSIS — E871 Hypo-osmolality and hyponatremia: Secondary | ICD-10-CM | POA: Diagnosis present

## 2016-02-17 DIAGNOSIS — E1100 Type 2 diabetes mellitus with hyperosmolarity without nonketotic hyperglycemic-hyperosmolar coma (NKHHC): Principal | ICD-10-CM | POA: Diagnosis present

## 2016-02-17 DIAGNOSIS — Z7901 Long term (current) use of anticoagulants: Secondary | ICD-10-CM

## 2016-02-17 DIAGNOSIS — Z86718 Personal history of other venous thrombosis and embolism: Secondary | ICD-10-CM

## 2016-02-17 DIAGNOSIS — F1010 Alcohol abuse, uncomplicated: Secondary | ICD-10-CM | POA: Diagnosis present

## 2016-02-17 DIAGNOSIS — Z833 Family history of diabetes mellitus: Secondary | ICD-10-CM

## 2016-02-17 DIAGNOSIS — E876 Hypokalemia: Secondary | ICD-10-CM | POA: Diagnosis present

## 2016-02-17 DIAGNOSIS — E1142 Type 2 diabetes mellitus with diabetic polyneuropathy: Secondary | ICD-10-CM | POA: Diagnosis present

## 2016-02-17 DIAGNOSIS — N289 Disorder of kidney and ureter, unspecified: Secondary | ICD-10-CM

## 2016-02-17 DIAGNOSIS — F17210 Nicotine dependence, cigarettes, uncomplicated: Secondary | ICD-10-CM | POA: Diagnosis present

## 2016-02-17 DIAGNOSIS — R7989 Other specified abnormal findings of blood chemistry: Secondary | ICD-10-CM | POA: Diagnosis present

## 2016-02-17 DIAGNOSIS — Z794 Long term (current) use of insulin: Secondary | ICD-10-CM

## 2016-02-17 DIAGNOSIS — R55 Syncope and collapse: Secondary | ICD-10-CM

## 2016-02-17 LAB — COMPREHENSIVE METABOLIC PANEL
ALT: 40 U/L (ref 17–63)
AST: 42 U/L — ABNORMAL HIGH (ref 15–41)
Albumin: 4.2 g/dL (ref 3.5–5.0)
Alkaline Phosphatase: 241 U/L — ABNORMAL HIGH (ref 38–126)
Anion gap: 14 (ref 5–15)
BUN: 9 mg/dL (ref 6–20)
CHLORIDE: 94 mmol/L — AB (ref 101–111)
CO2: 26 mmol/L (ref 22–32)
CREATININE: 1.85 mg/dL — AB (ref 0.61–1.24)
Calcium: 10.1 mg/dL (ref 8.9–10.3)
GFR, EST AFRICAN AMERICAN: 46 mL/min — AB (ref >60)
GFR, EST NON AFRICAN AMERICAN: 40 mL/min — AB (ref >60)
Glucose, Bld: 55 mg/dL — ABNORMAL LOW (ref 65–99)
POTASSIUM: 2.9 mmol/L — AB (ref 3.5–5.1)
Sodium: 134 mmol/L — ABNORMAL LOW (ref 135–145)
Total Bilirubin: 0.6 mg/dL (ref 0.3–1.2)
Total Protein: 8.1 g/dL (ref 6.5–8.1)

## 2016-02-17 LAB — CBC WITH DIFFERENTIAL/PLATELET
Basophils Absolute: 0.1 10*3/uL (ref 0.0–0.1)
Basophils Relative: 1 %
EOS PCT: 0 %
Eosinophils Absolute: 0 10*3/uL (ref 0.0–0.7)
HCT: 42.1 % (ref 39.0–52.0)
Hemoglobin: 15.2 g/dL (ref 13.0–17.0)
LYMPHS ABS: 2.2 10*3/uL (ref 0.7–4.0)
LYMPHS PCT: 20 %
MCH: 33.1 pg (ref 26.0–34.0)
MCHC: 36.1 g/dL — ABNORMAL HIGH (ref 30.0–36.0)
MCV: 91.7 fL (ref 78.0–100.0)
MONO ABS: 1.3 10*3/uL — AB (ref 0.1–1.0)
Monocytes Relative: 12 %
Neutro Abs: 7.2 10*3/uL (ref 1.7–7.7)
Neutrophils Relative %: 67 %
PLATELETS: 162 10*3/uL (ref 150–400)
RBC: 4.59 MIL/uL (ref 4.22–5.81)
RDW: 12.8 % (ref 11.5–15.5)
WBC: 10.8 10*3/uL — ABNORMAL HIGH (ref 4.0–10.5)

## 2016-02-17 LAB — CBG MONITORING, ED: Glucose-Capillary: 88 mg/dL (ref 65–99)

## 2016-02-17 LAB — LACTIC ACID, PLASMA: LACTIC ACID, VENOUS: 3.5 mmol/L — AB (ref 0.5–2.0)

## 2016-02-17 LAB — ETHANOL

## 2016-02-17 MED ORDER — SODIUM CHLORIDE 0.9 % IV BOLUS (SEPSIS)
500.0000 mL | Freq: Once | INTRAVENOUS | Status: AC
  Administered 2016-02-17: 500 mL via INTRAVENOUS

## 2016-02-17 NOTE — ED Notes (Signed)
Pt reports taking 30 units of regular insulin at home unsure of time for a 600 blood glucose.  CBG 88.

## 2016-02-17 NOTE — ED Provider Notes (Signed)
CSN: [id number]     Arrival date & time 02/17/16  2142 History   First MD Initiated Contact with Patient 02/17/16 2145     Chief Complaint  Patient presents with  . Hypotension     (Consider location/radiation/quality/duration/timing/severity/associated sxs/prior Treatment) HPI Comments: Patient with a history of insulin dependent DM, DVT, HTN, HLD, thyroid disease and emphysema presents with progressive weakness over the last 2 months, worsening pain in bilateral LE's thought related to neuropathy. He states that in the last 2-3 days he has felt significantly weak. He found his blood sugar elevated to 600 and took a total of 40 U insulin. He reports getting up to the bathroom and passing out, hitting his head and upper back in the fall. He denies chest pain, SOB, cough, fever, vomiting or diarrhea. He is here with sister who contributes to history and reports that, with the exception of his insulin, he has stopped taking all of his medications, including coumadin for history of DVT. The patient states it has been "months" since he had any other medication except insulin which he has continued.   The history is provided by the patient and a relative. No language interpreter was used.    Past Medical History  Diagnosis Date  . Acid reflux   . Acne rosacea   . Plantar fasciitis   . Thyroid disease   . Mood disorder ([place])   . Diabetes mellitus without complication ([place]) Dx 6659  . Hyperlipidemia Dx 2010  . Hypertension Dx 2010  . Substance abuse     last alchohol intake 01/24/2013  . DVT (deep venous thrombosis) ([place]) 2012 and 2013     Left and Right leg, no history of PE     Past Surgical History  Procedure Laterality Date  . Pacemaker insertion  2014   Family History  Problem Relation Age of Onset  . Diabetes Mother   . Diabetes Father   . Diabetes Sister    Social History  Substance Use Topics  . Smoking status: Current Every Day Smoker -- 6.00 packs/day    Types: Cigarettes   . Smokeless tobacco: Never Used  . Alcohol Use: None    Review of Systems  Constitutional: Negative for fever and chills.  Eyes: Negative.  Negative for visual disturbance.  Respiratory: Negative.  Negative for cough and shortness of breath.   Cardiovascular: Negative.  Negative for chest pain and leg swelling.  Gastrointestinal: Negative.  Negative for vomiting and abdominal pain.  Genitourinary: Negative.   Musculoskeletal: Positive for back pain (After fall with syncope).  Skin: Negative.  Negative for wound.  Neurological: Positive for syncope, weakness and headaches (after fall with syncope). Negative for seizures.  Psychiatric/Behavioral: Negative for confusion.      Allergies  Penicillins; Bacitracin; and Septra  Home Medications   Prior to Admission medications   Medication Sig Start Date End Date Taking? Authorizing Provider  atorvastatin (LIPITOR) 40 MG tablet Take 2 tablets (80 mg total) by mouth daily. 09/25/14   Boykin Nearing, MD  Blood Glucose Monitoring Suppl (BLOOD GLUCOSE METER KIT AND SUPPLIES) Dispense based on patient and insurance preference. Use up to four times daily as directed. (FOR ICD-9 250.00, 250.01). 09/18/14   Daleen Bo, MD  cholecalciferol (VITAMIN D) 1000 UNITS tablet Take 2 tablets (2,000 Units total) by mouth daily. 09/18/14   Daleen Bo, MD  gabapentin (NEURONTIN) 100 MG capsule Take 3 capsules (300 mg total) by mouth 3 (three) times daily. 09/18/14   Daleen Bo,  MD  insulin aspart (NOVOLOG) 100 UNIT/ML FlexPen Inject 20 Units into the skin 3 (three) times daily with meals. 10/04/14   Tresa Garter, MD  Insulin Glargine (LANTUS SOLOSTAR) 100 UNIT/ML Solostar Pen Inject 25 Units into the skin daily. 10/04/14   Tresa Garter, MD  levothyroxine (SYNTHROID, LEVOTHROID) 150 MCG tablet Take 1 tablet (150 mcg total) by mouth daily before breakfast. 10/04/14   Tresa Garter, MD  lidocaine (LIDODERM) 5 % Place 1-3 patches onto the  skin daily. Remove & Discard patch within 12 hours or as directed by MD    Historical Provider, MD  losartan-hydrochlorothiazide (HYZAAR) 50-12.5 MG per tablet Take 2 tablets by mouth daily. 11/16/14   Boykin Nearing, MD  metFORMIN (GLUCOPHAGE XR) 500 MG 24 hr tablet Take 2 tablets (1,000 mg total) by mouth daily with breakfast. 10/19/14   Josalyn Funches, MD  mometasone (ELOCON) 0.1 % cream Apply 1 application topically 2 (two) times daily as needed (inflammation).     Historical Provider, MD  Omega 3 1000 MG CAPS Take 2 capsules (2,000 mg total) by mouth daily. 09/18/14   Daleen Bo, MD  omeprazole (PRILOSEC) 20 MG capsule Take 1 capsule (20 mg total) by mouth daily. 09/18/14   Daleen Bo, MD  sertraline (ZOLOFT) 100 MG tablet Take 1 tablet (100 mg total) by mouth daily. 09/18/14   Daleen Bo, MD  traMADol (ULTRAM) 50 MG tablet Take 1-2 tablets (50-100 mg total) by mouth every 8 (eight) hours as needed. 10/19/14   Boykin Nearing, MD  traZODone (DESYREL) 50 MG tablet Take 0.5-1 tablets (25-50 mg total) by mouth at bedtime as needed for sleep. 11/16/14   Boykin Nearing, MD  warfarin (COUMADIN) 2 MG tablet 6 mg M, W,F and 8 mg T, Th, Sat, Sun 11/16/14   Boykin Nearing, MD  warfarin (COUMADIN) 4 MG tablet Take 2 tablets (8 mg total) by mouth daily. 10/05/14   Josalyn Funches, MD   BP 99/70 mmHg  Pulse 62  Resp 12  SpO2 100% Physical Exam  Constitutional: He is oriented to person, place, and time. He appears well-developed and well-nourished.  HENT:  Head: Normocephalic and atraumatic.  No visualized trauma to head/scalp.  Eyes: Conjunctivae are normal.  Neck: Normal range of motion. Neck supple.  Cardiovascular: Normal rate and regular rhythm.   Pulmonary/Chest: Effort normal and breath sounds normal.  Abdominal: Soft. Bowel sounds are normal. There is no tenderness. There is no rebound and no guarding.  Musculoskeletal: Normal range of motion. He exhibits no edema.  Moves all  extremities. There is mild midline and paracervical tenderness. Back appears atraumatic with mild generalized tenderness to upper back.   Neurological: He is alert and oriented to person, place, and time. Coordination normal.  CN's 3-12 grossly intact. Speech is clear and focused. No facial asymmetry or lateralizing weakness. No deficits of coordination.   Skin: Skin is warm and dry. No rash noted.  Psychiatric: He has a normal mood and affect.    ED Course  Procedures (including critical care time) Labs Review Labs Reviewed  CBC WITH DIFFERENTIAL/PLATELET - Abnormal; Notable for the following:    WBC 10.8 (*)    MCHC 36.1 (*)    Monocytes Absolute 1.3 (*)    All other components within normal limits  COMPREHENSIVE METABOLIC PANEL - Abnormal; Notable for the following:    Sodium 134 (*)    Potassium 2.9 (*)    Chloride 94 (*)    Glucose, Bld 55 (*)  Creatinine, Ser 1.85 (*)    AST 42 (*)    Alkaline Phosphatase 241 (*)    GFR calc non Af Amer 40 (*)    GFR calc Af Amer 46 (*)    All other components within normal limits  LACTIC ACID, PLASMA - Abnormal; Notable for the following:    Lactic Acid, Venous 3.5 (*)    All other components within normal limits  ETHANOL  LACTIC ACID, PLASMA  URINALYSIS, ROUTINE W REFLEX MICROSCOPIC (NOT AT [facility])  URINE RAPID DRUG SCREEN, HOSP PERFORMED  CBG MONITORING, ED   Results for orders placed or performed during the hospital encounter of 02/17/16  CBC with Differential  Result Value Ref Range   WBC 10.8 (H) 4.0 - 10.5 K/uL   RBC 4.59 4.22 - 5.81 MIL/uL   Hemoglobin 15.2 13.0 - 17.0 g/dL   HCT 42.1 39.0 - 52.0 %   MCV 91.7 78.0 - 100.0 fL   MCH 33.1 26.0 - 34.0 pg   MCHC 36.1 (H) 30.0 - 36.0 g/dL   RDW 12.8 11.5 - 15.5 %   Platelets 162 150 - 400 K/uL   Neutrophils Relative % 67 %   Neutro Abs 7.2 1.7 - 7.7 K/uL   Lymphocytes Relative 20 %   Lymphs Abs 2.2 0.7 - 4.0 K/uL   Monocytes Relative 12 %   Monocytes Absolute 1.3 (H) 0.1 -  1.0 K/uL   Eosinophils Relative 0 %   Eosinophils Absolute 0.0 0.0 - 0.7 K/uL   Basophils Relative 1 %   Basophils Absolute 0.1 0.0 - 0.1 K/uL  Comprehensive metabolic panel  Result Value Ref Range   Sodium 134 (L) 135 - 145 mmol/L   Potassium 2.9 (L) 3.5 - 5.1 mmol/L   Chloride 94 (L) 101 - 111 mmol/L   CO2 26 22 - 32 mmol/L   Glucose, Bld 55 (L) 65 - 99 mg/dL   BUN 9 6 - 20 mg/dL   Creatinine, Ser 1.85 (H) 0.61 - 1.24 mg/dL   Calcium 10.1 8.9 - 10.3 mg/dL   Total Protein 8.1 6.5 - 8.1 g/dL   Albumin 4.2 3.5 - 5.0 g/dL   AST 42 (H) 15 - 41 U/L   ALT 40 17 - 63 U/L   Alkaline Phosphatase 241 (H) 38 - 126 U/L   Total Bilirubin 0.6 0.3 - 1.2 mg/dL   GFR calc non Af Amer 40 (L) >60 mL/min   GFR calc Af Amer 46 (L) >60 mL/min   Anion gap 14 5 - 15  Lactic acid, plasma  Result Value Ref Range   Lactic Acid, Venous 3.5 (HH) 0.5 - 2.0 mmol/L  Lactic acid, plasma  Result Value Ref Range   Lactic Acid, Venous 2.5 (HH) 0.5 - 2.0 mmol/L  Urinalysis, Routine w reflex microscopic  Result Value Ref Range   Color, Urine AMBER (A) YELLOW   APPearance TURBID (A) CLEAR   Specific Gravity, Urine 1.026 1.005 - 1.030   pH 5.0 5.0 - 8.0   Glucose, UA >1000 (A) NEGATIVE mg/dL   Hgb urine dipstick TRACE (A) NEGATIVE   Bilirubin Urine SMALL (A) NEGATIVE   Ketones, ur 15 (A) NEGATIVE mg/dL   Protein, ur 100 (A) NEGATIVE mg/dL   Nitrite NEGATIVE NEGATIVE   Leukocytes, UA SMALL (A) NEGATIVE  Urine rapid drug screen (hosp performed)  Result Value Ref Range   Opiates NONE DETECTED NONE DETECTED   Cocaine NONE DETECTED NONE DETECTED   Benzodiazepines NONE DETECTED NONE DETECTED  Amphetamines NONE DETECTED NONE DETECTED   Tetrahydrocannabinol POSITIVE (A) NONE DETECTED   Barbiturates NONE DETECTED NONE DETECTED  Ethanol  Result Value Ref Range   Alcohol, Ethyl (B) <5 <5 mg/dL  Troponin I  Result Value Ref Range   Troponin I <0.03 <0.031 ng/mL  Magnesium  Result Value Ref Range   Magnesium  1.8 1.7 - 2.4 mg/dL  Urine microscopic-add on  Result Value Ref Range   Squamous Epithelial / LPF 0-5 (A) NONE SEEN   WBC, UA 6-30 0 - 5 WBC/hpf   RBC / HPF 0-5 0 - 5 RBC/hpf   Bacteria, UA RARE (A) NONE SEEN   Casts GRANULAR CAST (A) NEGATIVE  Protime-INR  Result Value Ref Range   Prothrombin Time 14.9 11.6 - 15.2 seconds   INR 1.15 0.00 - 4.74  Basic metabolic panel  Result Value Ref Range   Sodium 139 135 - 145 mmol/L   Potassium 3.9 3.5 - 5.1 mmol/L   Chloride 101 101 - 111 mmol/L   CO2 27 22 - 32 mmol/L   Glucose, Bld 223 (H) 65 - 99 mg/dL   BUN 9 6 - 20 mg/dL   Creatinine, Ser 1.38 (H) 0.61 - 1.24 mg/dL   Calcium 9.6 8.9 - 10.3 mg/dL   GFR calc non Af Amer 57 (L) >60 mL/min   GFR calc Af Amer >60 >60 mL/min   Anion gap 11 5 - 15  CBC  Result Value Ref Range   WBC 9.3 4.0 - 10.5 K/uL   RBC 3.90 (L) 4.22 - 5.81 MIL/uL   Hemoglobin 12.9 (L) 13.0 - 17.0 g/dL   HCT 36.0 (L) 39.0 - 52.0 %   MCV 92.3 78.0 - 100.0 fL   MCH 33.1 26.0 - 34.0 pg   MCHC 35.8 30.0 - 36.0 g/dL   RDW 12.9 11.5 - 15.5 %   Platelets 130 (L) 150 - 400 K/uL  Glucose, capillary  Result Value Ref Range   Glucose-Capillary 216 (H) 65 - 99 mg/dL  CBG monitoring, ED  Result Value Ref Range   Glucose-Capillary 88 65 - 99 mg/dL   Comment 1 Notify RN    Comment 2 Document in Chart    Dg Chest 2 View  02/18/2016  CLINICAL DATA:  Syncope with tachypnea. EXAM: CHEST  2 VIEW COMPARISON:  09/18/2014 FINDINGS: Dual lead left-sided pacemaker remains in place.The cardiomediastinal contours are normal. The lungs are clear. Pulmonary vasculature is normal. No consolidation, pleural effusion, or pneumothorax. No acute osseous abnormalities are seen. IMPRESSION: No acute process. Electronically Signed   By: Jeb Levering M.D.   On: 02/18/2016 00:03   Dg Cervical Spine Complete  02/18/2016  CLINICAL DATA:  Syncope with neck pain tonight. EXAM: CERVICAL SPINE - COMPLETE 4+ VIEW COMPARISON:  None. FINDINGS: Cervical  spine alignment is maintained. Vertebral body heights and intervertebral disc spaces are preserved. The dens is intact. Posterior elements appear well-aligned. Scattered facet arthropathy. There is no evidence of fracture. No prevertebral soft tissue edema. IMPRESSION: Minimal degenerative change without acute abnormality of the cervical spine. Electronically Signed   By: Jeb Levering M.D.   On: 02/18/2016 00:04   Ct Head Wo Contrast  02/18/2016  CLINICAL DATA:  Acute onset of unwitnessed syncope and collapse at home. Headache. Hit left side of forehead. Initial encounter. EXAM: CT HEAD WITHOUT CONTRAST TECHNIQUE: Contiguous axial images were obtained from the base of the skull through the vertex without intravenous contrast. COMPARISON:  None. FINDINGS: There is no  evidence of acute infarction, mass lesion, or intra- or extra-axial hemorrhage on CT. Scattered periventricular and subcortical white matter change likely reflects small vessel ischemic microangiopathy. The posterior fossa, including the cerebellum, brainstem and fourth ventricle, is within normal limits. The third and lateral ventricles, and basal ganglia are unremarkable in appearance. The cerebral hemispheres are symmetric in appearance, with normal gray-white differentiation. No mass effect or midline shift is seen. There is no evidence of fracture; visualized osseous structures are unremarkable in appearance. The visualized portions of the orbits are within normal limits. The paranasal sinuses and mastoid air cells are well-aerated. No significant soft tissue abnormalities are seen. IMPRESSION: 1. No acute intracranial pathology seen on CT. 2. Scattered small vessel ischemic microangiopathy. Electronically Signed   By: Garald Balding M.D.   On: 02/18/2016 00:10     Imaging Review No results found. I have personally reviewed and evaluated these images and lab results as part of my medical decision-making.   EKG Interpretation None       MDM   Final diagnoses:  None    1. Syncope 2. Renal insufficiency 3. dehyrdration 4. Hyperglycemia 5. Hypokalemia  Patient presents with family concerned for declining health over the last 2 months, more significantly over the last 2-3 days with decreased PO intake, increasing weakness, high blood sugar and episode syncope today. The patient is awake, alert, oriented. No evidence to suggest infection.  Elvated lactic acid and hypotension - sepsis considered but patient has negative evaluation otherwise. Antibiotics were not started.   IVF running with potassium supplementation. Negative imaging. Patient continues to endorse weakness, has electrolyte imbalance, erratic CBG, noncompliance with medications including Coumadin, HTN medications. Will admit for further evaluation, medication reconciliation and discussion of in home care. Patient and family agree.     Charlann Lange, PA-C 02/18/16 [id number]  Forde Dandy, MD 02/18/16 [phone]

## 2016-02-17 NOTE — ED Notes (Signed)
Pt arrived via EMS c/o hypotension.  EMS gave  Zofran, NS.  CBG 118 at home.  Left chest pacemaker.  Denies alcohol use x1 week.

## 2016-02-18 ENCOUNTER — Inpatient Hospital Stay (HOSPITAL_COMMUNITY): Payer: Medicaid Other

## 2016-02-18 ENCOUNTER — Encounter (HOSPITAL_COMMUNITY): Payer: Self-pay | Admitting: Family Medicine

## 2016-02-18 DIAGNOSIS — F329 Major depressive disorder, single episode, unspecified: Secondary | ICD-10-CM

## 2016-02-18 DIAGNOSIS — E860 Dehydration: Secondary | ICD-10-CM | POA: Diagnosis not present

## 2016-02-18 DIAGNOSIS — Z9114 Patient's other noncompliance with medication regimen: Secondary | ICD-10-CM | POA: Diagnosis not present

## 2016-02-18 DIAGNOSIS — E785 Hyperlipidemia, unspecified: Secondary | ICD-10-CM | POA: Diagnosis present

## 2016-02-18 DIAGNOSIS — I10 Essential (primary) hypertension: Secondary | ICD-10-CM | POA: Diagnosis not present

## 2016-02-18 DIAGNOSIS — E871 Hypo-osmolality and hyponatremia: Secondary | ICD-10-CM | POA: Diagnosis present

## 2016-02-18 DIAGNOSIS — I4891 Unspecified atrial fibrillation: Secondary | ICD-10-CM | POA: Diagnosis present

## 2016-02-18 DIAGNOSIS — E872 Acidosis: Secondary | ICD-10-CM | POA: Diagnosis present

## 2016-02-18 DIAGNOSIS — I480 Paroxysmal atrial fibrillation: Secondary | ICD-10-CM | POA: Diagnosis not present

## 2016-02-18 DIAGNOSIS — I330 Acute and subacute infective endocarditis: Secondary | ICD-10-CM | POA: Diagnosis not present

## 2016-02-18 DIAGNOSIS — F17210 Nicotine dependence, cigarettes, uncomplicated: Secondary | ICD-10-CM | POA: Diagnosis present

## 2016-02-18 DIAGNOSIS — Y838 Other surgical procedures as the cause of abnormal reaction of the patient, or of later complication, without mention of misadventure at the time of the procedure: Secondary | ICD-10-CM | POA: Diagnosis not present

## 2016-02-18 DIAGNOSIS — E1100 Type 2 diabetes mellitus with hyperosmolarity without nonketotic hyperglycemic-hyperosmolar coma (NKHHC): Secondary | ICD-10-CM | POA: Diagnosis present

## 2016-02-18 DIAGNOSIS — R55 Syncope and collapse: Secondary | ICD-10-CM | POA: Diagnosis present

## 2016-02-18 DIAGNOSIS — E869 Volume depletion, unspecified: Secondary | ICD-10-CM | POA: Diagnosis present

## 2016-02-18 DIAGNOSIS — E1142 Type 2 diabetes mellitus with diabetic polyneuropathy: Secondary | ICD-10-CM | POA: Diagnosis present

## 2016-02-18 DIAGNOSIS — I38 Endocarditis, valve unspecified: Secondary | ICD-10-CM | POA: Diagnosis present

## 2016-02-18 DIAGNOSIS — E861 Hypovolemia: Secondary | ICD-10-CM | POA: Diagnosis present

## 2016-02-18 DIAGNOSIS — R358 Other polyuria: Secondary | ICD-10-CM | POA: Diagnosis present

## 2016-02-18 DIAGNOSIS — Y831 Surgical operation with implant of artificial internal device as the cause of abnormal reaction of the patient, or of later complication, without mention of misadventure at the time of the procedure: Secondary | ICD-10-CM | POA: Diagnosis present

## 2016-02-18 DIAGNOSIS — T829XXD Unspecified complication of cardiac and vascular prosthetic device, implant and graft, subsequent encounter: Secondary | ICD-10-CM | POA: Diagnosis not present

## 2016-02-18 DIAGNOSIS — I959 Hypotension, unspecified: Secondary | ICD-10-CM | POA: Diagnosis present

## 2016-02-18 DIAGNOSIS — R7989 Other specified abnormal findings of blood chemistry: Secondary | ICD-10-CM | POA: Diagnosis present

## 2016-02-18 DIAGNOSIS — Z86718 Personal history of other venous thrombosis and embolism: Secondary | ICD-10-CM | POA: Diagnosis not present

## 2016-02-18 DIAGNOSIS — K759 Inflammatory liver disease, unspecified: Secondary | ICD-10-CM | POA: Diagnosis present

## 2016-02-18 DIAGNOSIS — E039 Hypothyroidism, unspecified: Secondary | ICD-10-CM | POA: Diagnosis present

## 2016-02-18 DIAGNOSIS — Z7901 Long term (current) use of anticoagulants: Secondary | ICD-10-CM | POA: Diagnosis not present

## 2016-02-18 DIAGNOSIS — Z794 Long term (current) use of insulin: Secondary | ICD-10-CM | POA: Diagnosis not present

## 2016-02-18 DIAGNOSIS — N179 Acute kidney failure, unspecified: Secondary | ICD-10-CM | POA: Diagnosis present

## 2016-02-18 DIAGNOSIS — T827XXA Infection and inflammatory reaction due to other cardiac and vascular devices, implants and grafts, initial encounter: Secondary | ICD-10-CM | POA: Diagnosis present

## 2016-02-18 DIAGNOSIS — I495 Sick sinus syndrome: Secondary | ICD-10-CM | POA: Diagnosis not present

## 2016-02-18 DIAGNOSIS — Z833 Family history of diabetes mellitus: Secondary | ICD-10-CM | POA: Diagnosis not present

## 2016-02-18 DIAGNOSIS — E876 Hypokalemia: Secondary | ICD-10-CM | POA: Diagnosis present

## 2016-02-18 DIAGNOSIS — D696 Thrombocytopenia, unspecified: Secondary | ICD-10-CM | POA: Diagnosis present

## 2016-02-18 DIAGNOSIS — B9689 Other specified bacterial agents as the cause of diseases classified elsewhere: Secondary | ICD-10-CM | POA: Diagnosis not present

## 2016-02-18 DIAGNOSIS — F1010 Alcohol abuse, uncomplicated: Secondary | ICD-10-CM | POA: Diagnosis present

## 2016-02-18 DIAGNOSIS — R631 Polydipsia: Secondary | ICD-10-CM | POA: Diagnosis present

## 2016-02-18 DIAGNOSIS — T82198A Other mechanical complication of other cardiac electronic device, initial encounter: Secondary | ICD-10-CM | POA: Diagnosis not present

## 2016-02-18 DIAGNOSIS — L719 Rosacea, unspecified: Secondary | ICD-10-CM | POA: Diagnosis present

## 2016-02-18 DIAGNOSIS — G8929 Other chronic pain: Secondary | ICD-10-CM | POA: Diagnosis present

## 2016-02-18 DIAGNOSIS — E11649 Type 2 diabetes mellitus with hypoglycemia without coma: Secondary | ICD-10-CM | POA: Diagnosis present

## 2016-02-18 DIAGNOSIS — E1165 Type 2 diabetes mellitus with hyperglycemia: Secondary | ICD-10-CM | POA: Diagnosis present

## 2016-02-18 DIAGNOSIS — K029 Dental caries, unspecified: Secondary | ICD-10-CM | POA: Diagnosis present

## 2016-02-18 DIAGNOSIS — F1020 Alcohol dependence, uncomplicated: Secondary | ICD-10-CM | POA: Diagnosis present

## 2016-02-18 LAB — RAPID URINE DRUG SCREEN, HOSP PERFORMED
AMPHETAMINES: NOT DETECTED
Barbiturates: NOT DETECTED
Benzodiazepines: NOT DETECTED
COCAINE: NOT DETECTED
OPIATES: NOT DETECTED
TETRAHYDROCANNABINOL: POSITIVE — AB

## 2016-02-18 LAB — URINALYSIS, ROUTINE W REFLEX MICROSCOPIC
Glucose, UA: >1000 mg/dL — AB
Ketones, ur: 15 mg/dL — AB
Nitrite: NEGATIVE
PH: 5 (ref 5.0–8.0)
Protein, ur: 100 mg/dL — AB
SPECIFIC GRAVITY, URINE: 1.026 (ref 1.005–1.030)

## 2016-02-18 LAB — CBC
HCT: 36 % — ABNORMAL LOW (ref 39.0–52.0)
HEMOGLOBIN: 12.9 g/dL — AB (ref 13.0–17.0)
MCH: 33.1 pg (ref 26.0–34.0)
MCHC: 35.8 g/dL (ref 30.0–36.0)
MCV: 92.3 fL (ref 78.0–100.0)
PLATELETS: 130 10*3/uL — AB (ref 150–400)
RBC: 3.9 MIL/uL — AB (ref 4.22–5.81)
RDW: 12.9 % (ref 11.5–15.5)
WBC: 9.3 10*3/uL (ref 4.0–10.5)

## 2016-02-18 LAB — CREATININE, SERUM
Creatinine, Ser: 1.41 mg/dL — ABNORMAL HIGH (ref 0.61–1.24)
GFR calc non Af Amer: 55 mL/min — ABNORMAL LOW (ref >60)

## 2016-02-18 LAB — CORTISOL-AM, BLOOD: Cortisol - AM: 5.2 ug/dL — ABNORMAL LOW (ref 6.7–22.6)

## 2016-02-18 LAB — URINE MICROSCOPIC-ADD ON

## 2016-02-18 LAB — MAGNESIUM: Magnesium: 1.8 mg/dL (ref 1.7–2.4)

## 2016-02-18 LAB — LACTIC ACID, PLASMA
LACTIC ACID, VENOUS: 1.7 mmol/L (ref 0.5–2.0)
Lactic Acid, Venous: 2.5 mmol/L (ref 0.5–2.0)

## 2016-02-18 LAB — TROPONIN I: Troponin I: <0.03 ng/mL (ref <0.031)

## 2016-02-18 LAB — BASIC METABOLIC PANEL
ANION GAP: 11 (ref 5–15)
BUN: 9 mg/dL (ref 6–20)
CALCIUM: 9.6 mg/dL (ref 8.9–10.3)
CHLORIDE: 101 mmol/L (ref 101–111)
CO2: 27 mmol/L (ref 22–32)
CREATININE: 1.38 mg/dL — AB (ref 0.61–1.24)
GFR calc Af Amer: >60 mL/min (ref >60)
GFR, EST NON AFRICAN AMERICAN: 57 mL/min — AB (ref >60)
GLUCOSE: 223 mg/dL — AB (ref 65–99)
POTASSIUM: 3.9 mmol/L (ref 3.5–5.1)
Sodium: 139 mmol/L (ref 135–145)

## 2016-02-18 LAB — GLUCOSE, CAPILLARY
GLUCOSE-CAPILLARY: 216 mg/dL — AB (ref 65–99)
GLUCOSE-CAPILLARY: 203 mg/dL — AB (ref 65–99)
GLUCOSE-CAPILLARY: 311 mg/dL — AB (ref 65–99)
Glucose-Capillary: 331 mg/dL — ABNORMAL HIGH (ref 65–99)
Glucose-Capillary: 219 mg/dL — ABNORMAL HIGH (ref 65–99)

## 2016-02-18 LAB — PROTIME-INR
INR: 1.15 (ref 0.00–1.49)
Prothrombin Time: 14.9 seconds (ref 11.6–15.2)

## 2016-02-18 LAB — TSH: TSH: 0.443 u[IU]/mL (ref 0.350–4.500)

## 2016-02-18 MED ORDER — SODIUM CHLORIDE 0.9 % IV SOLN
INTRAVENOUS | Status: DC
  Administered 2016-02-18 – 2016-02-20 (×5): via INTRAVENOUS

## 2016-02-18 MED ORDER — SODIUM CHLORIDE 0.9 % IV BOLUS (SEPSIS): 1000.0000 mL | Freq: Once | INTRAVENOUS | Status: DC

## 2016-02-18 MED ORDER — DULOXETINE HCL 30 MG PO CPEP
30.0000 mg | ORAL_CAPSULE | Freq: Every day | ORAL | Status: DC
  Administered 2016-02-18 – 2016-02-22 (×5): 30 mg via ORAL
  Filled 2016-02-18 (×5): qty 1

## 2016-02-18 MED ORDER — INSULIN ASPART 100 UNIT/ML ~~LOC~~ SOLN
0.0000 [IU] | Freq: Every day | SUBCUTANEOUS | Status: DC
  Administered 2016-02-18: 2 [IU] via SUBCUTANEOUS
  Administered 2016-02-19: 3 [IU] via SUBCUTANEOUS

## 2016-02-18 MED ORDER — OXYCODONE HCL 5 MG PO TABS
5.0000 mg | ORAL_TABLET | ORAL | Status: AC | PRN
  Administered 2016-02-19 (×2): 10 mg via ORAL
  Filled 2016-02-18 (×2): qty 2

## 2016-02-18 MED ORDER — HYDROMORPHONE HCL 1 MG/ML IJ SOLN
0.5000 mg | Freq: Once | INTRAMUSCULAR | Status: AC
  Administered 2016-02-18: 0.5 mg via INTRAVENOUS
  Filled 2016-02-18: qty 1

## 2016-02-18 MED ORDER — POTASSIUM CHLORIDE IN NACL 20-0.9 MEQ/L-% IV SOLN
Freq: Once | INTRAVENOUS | Status: AC
Start: 2016-02-18 — End: 2016-02-18
  Administered 2016-02-18: 02:00:00 via INTRAVENOUS
  Filled 2016-02-18: qty 1000

## 2016-02-18 MED ORDER — OXYCODONE HCL 5 MG PO TABS
10.0000 mg | ORAL_TABLET | ORAL | Status: AC
  Administered 2016-02-18: 10 mg via ORAL
  Filled 2016-02-18: qty 2

## 2016-02-18 MED ORDER — ENOXAPARIN SODIUM 40 MG/0.4ML ~~LOC~~ SOLN
40.0000 mg | SUBCUTANEOUS | Status: DC
Start: 2016-02-18 — End: 2016-02-19
  Administered 2016-02-18 – 2016-02-19 (×2): 40 mg via SUBCUTANEOUS
  Filled 2016-02-18 (×2): qty 0.4

## 2016-02-18 MED ORDER — INSULIN ASPART 100 UNIT/ML ~~LOC~~ SOLN
0.0000 [IU] | Freq: Three times a day (TID) | SUBCUTANEOUS | Status: DC
  Administered 2016-02-18: 3 [IU] via SUBCUTANEOUS
  Administered 2016-02-18 (×2): 7 [IU] via SUBCUTANEOUS
  Administered 2016-02-19: 2 [IU] via SUBCUTANEOUS
  Administered 2016-02-19: 3 [IU] via SUBCUTANEOUS
  Administered 2016-02-19: 5 [IU] via SUBCUTANEOUS
  Administered 2016-02-20: 3 [IU] via SUBCUTANEOUS
  Administered 2016-02-20: 2 [IU] via SUBCUTANEOUS
  Administered 2016-02-20: 5 [IU] via SUBCUTANEOUS
  Administered 2016-02-21: 7 [IU] via SUBCUTANEOUS
  Administered 2016-02-21 – 2016-02-22 (×3): 2 [IU] via SUBCUTANEOUS

## 2016-02-18 MED ORDER — SODIUM CHLORIDE 0.9% FLUSH
3.0000 mL | Freq: Two times a day (BID) | INTRAVENOUS | Status: DC
  Administered 2016-02-18 – 2016-02-22 (×8): 3 mL via INTRAVENOUS

## 2016-02-18 MED ORDER — OXYCODONE HCL 5 MG PO TABS
5.0000 mg | ORAL_TABLET | Freq: Four times a day (QID) | ORAL | Status: DC | PRN
  Administered 2016-02-18 (×2): 5 mg via ORAL
  Filled 2016-02-18 (×2): qty 1

## 2016-02-18 MED ORDER — SODIUM CHLORIDE 0.9 % IV BOLUS (SEPSIS)
1000.0000 mL | Freq: Once | INTRAVENOUS | Status: AC
  Administered 2016-02-18: 1000 mL via INTRAVENOUS

## 2016-02-18 MED ORDER — SODIUM CHLORIDE 0.9 % IV SOLN: INTRAVENOUS | Status: DC

## 2016-02-18 MED ORDER — INSULIN GLARGINE 100 UNIT/ML ~~LOC~~ SOLN
10.0000 [IU] | Freq: Every day | SUBCUTANEOUS | Status: DC
  Administered 2016-02-18: 10 [IU] via SUBCUTANEOUS
  Filled 2016-02-18 (×2): qty 0.1

## 2016-02-18 MED ORDER — ACETAMINOPHEN 325 MG PO TABS: 650.0000 mg | ORAL_TABLET | Freq: Four times a day (QID) | ORAL | Status: DC | PRN

## 2016-02-18 NOTE — Progress Notes (Addendum)
Patient's sister, Glen Kesinger, called to check the patient.  Sister said that her brother stated that no physician had seen him today.  Informed the sister that indeed a physician has seen him today.  Sister concerned about the patient and his need for help at home.  Informed nurse case manager.  Nurse case manager stated that the patient would have to fill out the [facility] Personal Care services application with the patient's PCP and fax it to [facility].  Peri Maris, MBA, BS, RN

## 2016-02-18 NOTE — Consult Note (Signed)
[facility] Face-to-Face Psychiatry Consult   Reason for Consult:  Depression and alcohol abuse Referring Physician:  Dr. Carles Collet Patient Identification: Yuji Walth MRN:  [id number] Principal Diagnosis: Depression Diagnosis:   Patient Active Problem List   Diagnosis Date Noted  . AKI (acute kidney injury) ([place]) [N17.9] 02/18/2016  . Hypokalemia [E87.6] 02/18/2016  . Hyponatremia [E87.1] 02/18/2016  . Syncope [R55] 02/18/2016  . Elevated lactic acid level [E87.2] 02/18/2016  . Depression [F32.9] 11/16/2014  . Infected dental carries [K02.9] 11/16/2014  . Atrial fibrillation ([place]) [I48.91] 10/05/2014  . Anticoagulated on Coumadin [Z51.81, Z79.01] 10/05/2014  . High cholesterol [E78.00] 09/26/2014  . DM type 2 with diabetic peripheral neuropathy ([place]) [E11.42] 09/25/2014  . Hypertension [I10] 09/25/2014  . Personal history of DVT (deep vein thrombosis) [Z86.718] 09/25/2014  . Chronic low back pain [M54.5, G89.29] 09/25/2014  . Pacemaker [Z95.0] 09/25/2014  . Irregular heartbeat [I49.9] 09/25/2014  . Hypothyroidism [E03.9] 09/25/2014    Total Time spent with patient: 1 hour  Subjective:   Mung Chesnut is a 55 y.o. male patient admitted with depression and history of alcohol abuse.  HPI:  Atzin Buchta is a 55 y.o. male seen, chart reviewed for psychiatric consultation and evaluation of increased symptoms of depression, self-medicating with alcohol and noncompliant with medication treatment for depression and multiple medical problems over one year recur patient stated he was admitted to the hospital because of generalized weakness, muscle cramps and spasms and feeling dizziness and blackout. Patient stated he has been diagnosed with diabetes, hypertension, hypothyroidism but not able to get appropriate services since he came to the [place] about 18 months ago. Patient reported not cannot does not provide comprehensive medical care as it was provided when he was living in  [place] over 20 years. Patient reported he becomes sick and has no family members over there so he came to stay close to his family members including mother, sister and a niece. Patient reportedly staying with his niece. Patient is also reported he has been drinking 12 packs of beer daily until 1 week ago and quit drinking because he promised to his mother and denies with the current symptoms, seizures, hallucinations also endorses smoking tobacco and marijuana. Patient denies current suicidal/homicidal ideation, no evidence of psychosis. Patient reported feeling bad, feeling alone, disturbance of sleep and appetite. Patient is angry and blame [facility] and medical care regarding his diabetes and chronic back pain not able to get in home services/intensive in-home services and he has to pay for all services even he was on disability.  Medical history: Patient with a past medical history significant for IDDM, hypothyroidism, chronic back pain and Afib with a pacer who presents with hypotension.  Past psychiatric history: Patient has no previous acute psychiatric hospitalization and not able to provide any medication treatment received while living in [place] to Self: Is patient at risk for suicide?: No Risk to Others:   Prior Inpatient Therapy:   Prior Outpatient Therapy:    Past Medical History:  Past Medical History  Diagnosis Date  . Acid reflux   . Acne rosacea   . Plantar fasciitis   . Thyroid disease   . Mood disorder ([place])   . Diabetes mellitus without complication ([place]) Dx 9470  . Hyperlipidemia Dx 2010  . Hypertension Dx 2010  . Substance abuse     last alchohol intake 01/24/2013  . DVT (deep venous thrombosis) ([place]) 2012 and 2013     Left and Right leg,  no history of PE      Past Surgical History  Procedure Laterality Date  . Pacemaker insertion  2014   Family History:  Family History  Problem Relation Age of Onset  . Diabetes Mother   .  Diabetes Father   . Diabetes Sister    Family Psychiatric  History: Unknown Social History:  History  Alcohol Use: Not on file     History  Drug Use Not on file    Social History   Social History  . Marital Status: Single    Spouse Name: N/A  . Number of Children: N/A  . Years of Education: N/A   Social History Main Topics  . Smoking status: Current Every Day Smoker -- 6.00 packs/day    Types: Cigarettes  . Smokeless tobacco: Never Used  . Alcohol Use: None  . Drug Use: None  . Sexual Activity: Not Asked   Other Topics Concern  . None   Social History Narrative   Moved back to [place] after living in [place], [place] 08/2014.    Moved to be closer to his aging mother.   Lived in [place] for 15 years.          Additional Social History:    Allergies:   Allergies  Allergen Reactions  . Penicillins Hives  . Bacitracin Itching, Swelling and Rash  . Septra [Sulfamethoxazole-Trimethoprim] Itching, Swelling and Rash    Labs:  Results for orders placed or performed during the hospital encounter of 02/17/16 (from the past 48 hour(s))  CBG monitoring, ED     Status: None   Collection Time: 02/17/16  9:51 PM  Result Value Ref Range   Glucose-Capillary 88 65 - 99 mg/dL   Comment 1 Notify RN    Comment 2 Document in Chart   Lactic acid, plasma     Status: Abnormal   Collection Time: 02/17/16 10:53 PM  Result Value Ref Range   Lactic Acid, Venous 3.5 (HH) 0.5 - 2.0 mmol/L    Comment: CRITICAL RESULT CALLED TO, READ BACK BY AND VERIFIED WITH: HUGHES C,RN 02/17/16 2340 WAYK   Ethanol     Status: None   Collection Time: 02/17/16 10:53 PM  Result Value Ref Range   Alcohol, Ethyl (B) <5 <5 mg/dL    Comment:        LOWEST DETECTABLE LIMIT FOR SERUM ALCOHOL IS 5 mg/dL FOR MEDICAL PURPOSES ONLY   CBC with Differential     Status: Abnormal   Collection Time: 02/17/16 10:56 PM  Result Value Ref Range   WBC 10.8 (H) 4.0 - 10.5 K/uL   RBC 4.59 4.22 - 5.81 MIL/uL    Hemoglobin 15.2 13.0 - 17.0 g/dL   HCT 42.1 39.0 - 52.0 %   MCV 91.7 78.0 - 100.0 fL   MCH 33.1 26.0 - 34.0 pg   MCHC 36.1 (H) 30.0 - 36.0 g/dL   RDW 12.8 11.5 - 15.5 %   Platelets 162 150 - 400 K/uL   Neutrophils Relative % 67 %   Neutro Abs 7.2 1.7 - 7.7 K/uL   Lymphocytes Relative 20 %   Lymphs Abs 2.2 0.7 - 4.0 K/uL   Monocytes Relative 12 %   Monocytes Absolute 1.3 (H) 0.1 - 1.0 K/uL   Eosinophils Relative 0 %   Eosinophils Absolute 0.0 0.0 - 0.7 K/uL   Basophils Relative 1 %   Basophils Absolute 0.1 0.0 - 0.1 K/uL  Comprehensive metabolic panel     Status: Abnormal  Collection Time: 02/17/16 10:56 PM  Result Value Ref Range   Sodium 134 (L) 135 - 145 mmol/L   Potassium 2.9 (L) 3.5 - 5.1 mmol/L   Chloride 94 (L) 101 - 111 mmol/L   CO2 26 22 - 32 mmol/L   Glucose, Bld 55 (L) 65 - 99 mg/dL   BUN 9 6 - 20 mg/dL   Creatinine, Ser 1.85 (H) 0.61 - 1.24 mg/dL   Calcium 10.1 8.9 - 10.3 mg/dL   Total Protein 8.1 6.5 - 8.1 g/dL   Albumin 4.2 3.5 - 5.0 g/dL   AST 42 (H) 15 - 41 U/L   ALT 40 17 - 63 U/L   Alkaline Phosphatase 241 (H) 38 - 126 U/L   Total Bilirubin 0.6 0.3 - 1.2 mg/dL   GFR calc non Af Amer 40 (L) >60 mL/min   GFR calc Af Amer 46 (L) >60 mL/min    Comment: (NOTE) The eGFR has been calculated using the CKD EPI equation. This calculation has not been validated in all clinical situations. eGFR's persistently <60 mL/min signify possible Chronic Kidney Disease.    Anion gap 14 5 - 15  Urinalysis, Routine w reflex microscopic     Status: Abnormal   Collection Time: 02/18/16 12:30 AM  Result Value Ref Range   Color, Urine AMBER (A) YELLOW    Comment: BIOCHEMICALS MAY BE AFFECTED BY COLOR   APPearance TURBID (A) CLEAR   Specific Gravity, Urine 1.026 1.005 - 1.030   pH 5.0 5.0 - 8.0   Glucose, UA >1000 (A) NEGATIVE mg/dL   Hgb urine dipstick TRACE (A) NEGATIVE   Bilirubin Urine SMALL (A) NEGATIVE   Ketones, ur 15 (A) NEGATIVE mg/dL   Protein, ur 100 (A)  NEGATIVE mg/dL   Nitrite NEGATIVE NEGATIVE   Leukocytes, UA SMALL (A) NEGATIVE  Urine rapid drug screen (hosp performed)     Status: Abnormal   Collection Time: 02/18/16 12:30 AM  Result Value Ref Range   Opiates NONE DETECTED NONE DETECTED   Cocaine NONE DETECTED NONE DETECTED   Benzodiazepines NONE DETECTED NONE DETECTED   Amphetamines NONE DETECTED NONE DETECTED   Tetrahydrocannabinol POSITIVE (A) NONE DETECTED   Barbiturates NONE DETECTED NONE DETECTED    Comment:        DRUG SCREEN FOR MEDICAL PURPOSES ONLY.  IF CONFIRMATION IS NEEDED FOR ANY PURPOSE, NOTIFY LAB WITHIN 5 DAYS.        LOWEST DETECTABLE LIMITS FOR URINE DRUG SCREEN Drug Class       Cutoff (ng/mL) Amphetamine      1000 Barbiturate      200 Benzodiazepine   628 Tricyclics       315 Opiates          300 Cocaine          300 THC              50   Urine microscopic-add on     Status: Abnormal   Collection Time: 02/18/16 12:30 AM  Result Value Ref Range   Squamous Epithelial / LPF 0-5 (A) NONE SEEN   WBC, UA 6-30 0 - 5 WBC/hpf   RBC / HPF 0-5 0 - 5 RBC/hpf   Bacteria, UA RARE (A) NONE SEEN   Casts GRANULAR CAST (A) NEGATIVE  Troponin I     Status: None   Collection Time: 02/18/16  2:30 AM  Result Value Ref Range   Troponin I <0.03 <0.031 ng/mL    Comment:  NO INDICATION OF MYOCARDIAL INJURY.   Magnesium     Status: None   Collection Time: 02/18/16  2:30 AM  Result Value Ref Range   Magnesium 1.8 1.7 - 2.4 mg/dL  Lactic acid, plasma     Status: Abnormal   Collection Time: 02/18/16  2:40 AM  Result Value Ref Range   Lactic Acid, Venous 2.5 (HH) 0.5 - 2.0 mmol/L    Comment: CRITICAL RESULT CALLED TO, READ BACK BY AND VERIFIED WITH: MARSHALL C,RN 02/18/16 0318 WAYK   Protime-INR     Status: None   Collection Time: 02/18/16  2:44 AM  Result Value Ref Range   Prothrombin Time 14.9 11.6 - 15.2 seconds   INR 1.15 0.00 - 9.76  Basic metabolic panel     Status: Abnormal   Collection Time:  02/18/16  2:44 AM  Result Value Ref Range   Sodium 139 135 - 145 mmol/L   Potassium 3.9 3.5 - 5.1 mmol/L    Comment: DELTA CHECK NOTED   Chloride 101 101 - 111 mmol/L   CO2 27 22 - 32 mmol/L   Glucose, Bld 223 (H) 65 - 99 mg/dL   BUN 9 6 - 20 mg/dL   Creatinine, Ser 1.38 (H) 0.61 - 1.24 mg/dL   Calcium 9.6 8.9 - 10.3 mg/dL   GFR calc non Af Amer 57 (L) >60 mL/min   GFR calc Af Amer >60 >60 mL/min    Comment: (NOTE) The eGFR has been calculated using the CKD EPI equation. This calculation has not been validated in all clinical situations. eGFR's persistently <60 mL/min signify possible Chronic Kidney Disease.    Anion gap 11 5 - 15  CBC     Status: Abnormal   Collection Time: 02/18/16  2:44 AM  Result Value Ref Range   WBC 9.3 4.0 - 10.5 K/uL   RBC 3.90 (L) 4.22 - 5.81 MIL/uL   Hemoglobin 12.9 (L) 13.0 - 17.0 g/dL   HCT 36.0 (L) 39.0 - 52.0 %   MCV 92.3 78.0 - 100.0 fL   MCH 33.1 26.0 - 34.0 pg   MCHC 35.8 30.0 - 36.0 g/dL   RDW 12.9 11.5 - 15.5 %   Platelets 130 (L) 150 - 400 K/uL  Glucose, capillary     Status: Abnormal   Collection Time: 02/18/16  2:59 AM  Result Value Ref Range   Glucose-Capillary 216 (H) 65 - 99 mg/dL  Lactic acid, plasma     Status: None   Collection Time: 02/18/16  6:12 AM  Result Value Ref Range   Lactic Acid, Venous 1.7 0.5 - 2.0 mmol/L  Creatinine, serum     Status: Abnormal   Collection Time: 02/18/16  6:12 AM  Result Value Ref Range   Creatinine, Ser 1.41 (H) 0.61 - 1.24 mg/dL   GFR calc non Af Amer 55 (L) >60 mL/min   GFR calc Af Amer >60 >60 mL/min    Comment: (NOTE) The eGFR has been calculated using the CKD EPI equation. This calculation has not been validated in all clinical situations. eGFR's persistently <60 mL/min signify possible Chronic Kidney Disease.   Troponin I     Status: None   Collection Time: 02/18/16  6:12 AM  Result Value Ref Range   Troponin I <0.03 <0.031 ng/mL    Comment:        NO INDICATION OF MYOCARDIAL  INJURY.   Cortisol-am, blood     Status: Abnormal   Collection Time: 02/18/16  6:12 AM  Result Value Ref Range   Cortisol - AM 5.2 (L) 6.7 - 22.6 ug/dL  TSH     Status: None   Collection Time: 02/18/16  6:12 AM  Result Value Ref Range   TSH 0.443 0.350 - 4.500 uIU/mL    Current Facility-Administered Medications  Medication Dose Route Frequency Provider Last Rate Last Dose  . 0.9 %  sodium chloride infusion   Intravenous Continuous Edwin Dada, MD 125 mL/hr at 02/18/16 [phone]    . acetaminophen (TYLENOL) tablet 650 mg  650 mg Oral Q6H PRN Edwin Dada, MD      . enoxaparin (LOVENOX) injection 40 mg  40 mg Subcutaneous Q24H Edwin Dada, MD   40 mg at 02/18/16 1009  . insulin aspart (novoLOG) injection 0-5 Units  0-5 Units Subcutaneous QHS Edwin Dada, MD      . insulin aspart (novoLOG) injection 0-9 Units  0-9 Units Subcutaneous TID WC Edwin Dada, MD   3 Units at 02/18/16 [phone]  . oxyCODONE (Oxy IR/ROXICODONE) immediate release tablet 5 mg  5 mg Oral Q6H PRN Edwin Dada, MD   5 mg at 02/18/16 0843  . sodium chloride flush (NS) 0.9 % injection 3 mL  3 mL Intravenous Q12H Edwin Dada, MD   3 mL at 02/18/16 0450    Musculoskeletal: Strength & Muscle Tone: decreased Gait & Station: unable to stand Patient leans: N/A  Psychiatric Specialty Exam: ROS generalized weakness, depression, disturbance of sleep and appetite unable to care for his chronic medical problems, patient denied nausea, vomiting, sweating, shaking, seizures, shortness of breath and chest pain No Fever-chills, No Headache, No changes with Vision or hearing, reports vertigo No problems swallowing food or Liquids, No Chest pain, Cough or Shortness of Breath, No Abdominal pain, No Nausea or Vommitting, Bowel movements are regular, No Blood in stool or Urine, No dysuria, No new skin rashes or bruises, No new joints pains-aches,  No new weakness, tingling,  numbness in any extremity, No recent weight gain or loss, No polyuria, polydypsia or polyphagia,   A full 10 point Review of Systems was done, except as stated above, all other Review of Systems were negative.  Blood pressure 105/63, pulse 64, temperature 98.8 F (37.1 C), temperature source Oral, resp. rate 16, height 6' (1.829 m), weight 78.1 kg (172 lb 2.9 oz), SpO2 100 %.Body mass index is 23.35 kg/(m^2).  General Appearance: Disheveled and Guarded  Eye Contact::  Good  Speech:  Clear and Coherent  Volume:  Decreased  Mood:  Angry and Depressed  Affect:  Constricted and Depressed  Thought Process:  Coherent and Goal Directed  Orientation:  Full (Time, Place, and Person)  Thought Content:  Rumination  Suicidal Thoughts:  No  Homicidal Thoughts:  No  Memory:  Immediate;   Fair Recent;   Fair  Judgement:  Impaired  Insight:  Fair  Psychomotor Activity:  Decreased  Concentration:  Fair  Recall:  Good  Fund of Knowledge:Good  Language: Good  Akathisia:  Negative  Handed:  Right  AIMS (if indicated):     Assets:  Communication Skills Desire for Improvement Financial Resources/Insurance Housing Leisure Time Resilience Social Support Transportation  ADL's:  Intact  Cognition: WNL  Sleep:      Treatment Plan Summary: Daily contact with patient to assess and evaluate symptoms and progress in treatment and Medication management  Review at time of Cymbalta 30 mg daily which can be titrated up to 60 mg a day  if clinically tolerated and benefits Patient has no safety concerns Patient has no symptoms of alcohol withdrawal syndrome. Unit social worker to provide outpatient psychiatric services and medically stable  Disposition: Patient does not meet criteria for psychiatric inpatient admission. Supportive therapy provided about ongoing stressors.  Durward Parcel., MD 02/18/2016 10:58 AM

## 2016-02-18 NOTE — Progress Notes (Signed)
PROGRESS NOTE  Patrick Small ZOX:[id number] DOB: October 30, 1961 DOA: 02/17/2016 PCP: Lora Paula, MD Brief History 55 year old male with a history of insulin-dependent diabetes mellitus, sick sinus syndrome status post PPM, alcohol dependence presented with generalized pain all over and syncope. The patient has been self-medicating himself with alcohol for chronic back pain. He drinks at least 2 x 40 oz beers daily with occasional liquor. He quit drinking about one week prior to this admission. On the day of admission, he woke up with generalized body aches and was going to the bathroom when he had a syncopal episode. He states that he has not been taking his insulin faithfully. He checked his sugars earlier on the day of admission and noted it to be >600.  He gave himself 40 units of insulin although he cannot tell me the type of insulin (MAR lists Lantus).  In the ED he was actually hypoglycemic with symptoms because of 55. The patient was noted to have acute kidney injury with a serum creatinine 1.5 and lactic acid 3.5. The patient was started on fluid resuscitation. He also had relative hypotension with systolic blood pressure of 95 in the emergency department.   Assessment/Plan: Syncope  -likely secondary to volume depletion - patient has been having polydipsia and polyuria for the past week  -Patient likely had  HONK at home -orthostatics -echo -IVF -UDS positive for THC -EKG paced, nonspecific T wave changes Lactic acidosis  -Secondary to volume depletion  -Continue IV fluids  -follow blood cultures -CXR neg for infiltrates Diabetes mellitus type 2 with polyneuropathy -Hemoglobin A1c pending  -Start Lantus 10 units  -NovoLog sliding scale  -Start gabapentin Acute kidney injury  -Secondary to volume depletion  -Improving with hydration  -Urinalysis with 100 protein Transaminasemia - hepatitis B surface antigen  -Hepatitis C antibody  -HIV   Depression -appreciate pyschiatry -started cymbalta Hyponatremia -secondary to volume depletion -improved with IVF Hypokalemia -repleted -check mag Chronic back pain -X-ray thoracic and lumbar spine  Family Communication:   Sister updated at beside Disposition Plan:   Home 1-2 days      Procedures/Studies: Dg Chest 2 View  02/18/2016  CLINICAL DATA:  Syncope with tachypnea. EXAM: CHEST  2 VIEW COMPARISON:  09/18/2014 FINDINGS: Dual lead left-sided pacemaker remains in place.The cardiomediastinal contours are normal. The lungs are clear. Pulmonary vasculature is normal. No consolidation, pleural effusion, or pneumothorax. No acute osseous abnormalities are seen. IMPRESSION: No acute process. Electronically Signed   By: Rubye Oaks M.D.   On: 02/18/2016 00:03   Dg Cervical Spine Complete  02/18/2016  CLINICAL DATA:  Syncope with neck pain tonight. EXAM: CERVICAL SPINE - COMPLETE 4+ VIEW COMPARISON:  None. FINDINGS: Cervical spine alignment is maintained. Vertebral body heights and intervertebral disc spaces are preserved. The dens is intact. Posterior elements appear well-aligned. Scattered facet arthropathy. There is no evidence of fracture. No prevertebral soft tissue edema. IMPRESSION: Minimal degenerative change without acute abnormality of the cervical spine. Electronically Signed   By: Rubye Oaks M.D.   On: 02/18/2016 00:04   Ct Head Wo Contrast  02/18/2016  CLINICAL DATA:  Acute onset of unwitnessed syncope and collapse at home. Headache. Hit left side of forehead. Initial encounter. EXAM: CT HEAD WITHOUT CONTRAST TECHNIQUE: Contiguous axial images were obtained from the base of the skull through the vertex without intravenous contrast. COMPARISON:  None. FINDINGS: There is no evidence of acute infarction, mass lesion, or intra- or extra-axial  hemorrhage on CT. Scattered periventricular and subcortical white matter change likely reflects small vessel ischemic  microangiopathy. The posterior fossa, including the cerebellum, brainstem and fourth ventricle, is within normal limits. The third and lateral ventricles, and basal ganglia are unremarkable in appearance. The cerebral hemispheres are symmetric in appearance, with normal gray-white differentiation. No mass effect or midline shift is seen. There is no evidence of fracture; visualized osseous structures are unremarkable in appearance. The visualized portions of the orbits are within normal limits. The paranasal sinuses and mastoid air cells are well-aerated. No significant soft tissue abnormalities are seen. IMPRESSION: 1. No acute intracranial pathology seen on CT. 2. Scattered small vessel ischemic microangiopathy. Electronically Signed   By: Roanna Raider M.D.   On: 02/18/2016 00:10         Subjective:   Objective: Filed Vitals:   02/18/16 0130 02/18/16 0154 02/18/16 0218 02/18/16 0842  BP: 104/73  118/74 105/63  Pulse: 63  78 64  Temp:  98.2 F (36.8 C) 98.2 F (36.8 C) 98.8 F (37.1 C)  TempSrc:  Oral Oral Oral  Resp:   19 16  Height:   6' (1.829 m)   Weight:   78.1 kg (172 lb 2.9 oz)   SpO2: 99%  99% 100%    Intake/Output Summary (Last 24 hours) at 02/18/16 1559 Last data filed at 02/18/16 1435  Gross per 24 hour  Intake 972.92 ml  Output    225 ml  Net 747.92 ml   Weight change:  Exam:   General:  Pt is alert, follows commands appropriately, not in acute distress  HEENT: No icterus, No thrush, No neck mass, [place]/AT  Cardiovascular: RRR, S1/S2, no rubs, no gallops  Respiratory: CTA bilaterally, no wheezing, no crackles, no rhonchi  Abdomen: Soft/+BS, non tender, non distended, no guarding  Extremities: No edema, No lymphangitis, No petechiae, No rashes, no synovitis  Data Reviewed: Basic Metabolic Panel:  Recent Labs Lab 02/17/16 2256 02/18/16 0230 02/18/16 0244 02/18/16 0612  NA 134*  --  139  --   K 2.9*  --  3.9  --   CL 94*  --  101  --   CO2 26  --   27  --   GLUCOSE 55*  --  223*  --   BUN 9  --  9  --   CREATININE 1.85*  --  1.38* 1.41*  CALCIUM 10.1  --  9.6  --   MG  --  1.8  --   --    Liver Function Tests:  Recent Labs Lab 02/17/16 2256  AST 42*  ALT 40  ALKPHOS 241*  BILITOT 0.6  PROT 8.1  ALBUMIN 4.2   No results for input(s): LIPASE, AMYLASE in the last 168 hours. No results for input(s): AMMONIA in the last 168 hours. CBC:  Recent Labs Lab 02/17/16 2256 02/18/16 0244  WBC 10.8* 9.3  NEUTROABS 7.2  --   HGB 15.2 12.9*  HCT 42.1 36.0*  MCV 91.7 92.3  PLT 162 130*   Cardiac Enzymes:  Recent Labs Lab 02/18/16 0230 02/18/16 0612  TROPONINI <0.03 <0.03   BNP: Invalid input(s): POCBNP CBG:  Recent Labs Lab 02/17/16 2151 02/18/16 0259 02/18/16 0755 02/18/16 1151  GLUCAP 88 216* 203* 311*    No results found for this or any previous visit (from the past 240 hour(s)).   Scheduled Meds: . DULoxetine  30 mg Oral Daily  . enoxaparin (LOVENOX) injection  40 mg Subcutaneous Q24H  .  insulin aspart  0-5 Units Subcutaneous QHS  . insulin aspart  0-9 Units Subcutaneous TID WC  . sodium chloride flush  3 mL Intravenous Q12H   Continuous Infusions: . sodium chloride 125 mL/hr at 02/18/16 1505     Brendan Gadson, DO  [facility] Pager [phone]  If 7PM-7AM, please contact night-coverage [email or web address] Password [email or web address] 02/18/2016, 3:59 PM   LOS: 0 days

## 2016-02-18 NOTE — Progress Notes (Signed)
Inpatient Diabetes Program Recommendations  AACE/ADA: New Consensus Statement on Inpatient Glycemic Control (2015)  Target Ranges:  Prepandial:   less than 140 mg/dL      Peak postprandial:   less than 180 mg/dL (1-2 hours)      Critically ill patients:  140 - 180 mg/dL   Review of Glycemic Control  Results for JAKING, THAYER (MRN [id number]) as of 02/18/2016 15:09  Ref. Range 02/17/2016 21:51 02/18/2016 02:59 02/18/2016 07:55 02/18/2016 11:51  Glucose-Capillary Latest Ref Range: 65-99 mg/dL 88 409 (H) 811 (H) 914 (H)   Diabetes history:DM2 Outpatient Diabetes medications: Lantus 20 units daily Current orders for Inpatient glycemic control: Sensitive correction tidwc and HS scales  Inpatient Diabetes Program Recommendations:    Please consider adding lantus of 10 units daily-increase as needed for fasting glucose control  Thank you Lenor Coffin, RN, MSN, CDE  Diabetes Inpatient Program Office: [phone] Pager: [phone] 8:00 am to 5:00 pm.

## 2016-02-18 NOTE — Care Management Note (Addendum)
Case Management Note  Patient Details  Name: Patrick Small MRN: [id number] Date of Birth: 07-01-1961  Subjective/Objective:     CM following for progression and d/c planning.               Action/Plan: 02/18/2016 CM notified of pt need for assistance in the home, spoke with charge RN, Meridee Score,, who discussed Personal Care Services with the pt and he was provided with an application for these services. This must be completed by the pt , PCP and faxed to [facility] in order for pt to be evaluated for the program. The hospital is not allowed to complete the application process. Will ask for [facility] order for assessment and evaluation in the home. [place] medicaid will not cover HHPT for this medicaid patients, unless they have suffered a CVA or joint replacement. Per pt he must have received extensive services in his previous state of residence, however he will have to make application to receive [facility] aide services in [place].  Noted request for medication assistance , however this pt has [place] Medicaid, so should have minimal copays for meds, we are unable to assist this insured patient.   Expected Discharge Date:                  Expected Discharge Plan:  Home w Home Health Services  In-House Referral:  Clinical Social Work  Discharge planning Services  CM Consult  Post Acute Care Choice:  Home Health Choice offered to:  Patient  DME Arranged:  N/A DME Agency:  NA  HH Arranged:  RN HH Agency:     Status of Service:  In process, will continue to follow  Medicare Important Message Given:    Date Medicare IM Given:    Medicare IM give by:    Date Additional Medicare IM Given:    Additional Medicare Important Message give by:     If discussed at Long Length of Stay Meetings, dates discussed:    Additional Comments:  Starlyn Skeans, RN 02/18/2016, 5:01 PM

## 2016-02-18 NOTE — H&P (Signed)
History and Physical  Patient Name: Patrick Small     ZOX:[id number]    DOB: 06-Sep-1961    DOA: 02/17/2016 Referring physician: Elpidio Anis, PA-C PCP: Lora Paula, MD      Chief Complaint: Back pain and syncope  HPI: Patrick Small is a 55 y.o. male with a past medical history significant for IDDM, hypothyroidism, chronic back pain and Afib with a pacer who presents with hypotension.  The patient moved back to [place] from [place] about 18 months ago. Around that time he tried to get continued long-term opioid therapy for his back pain, this was refused, and since then it seems he has not been obtaining any medical care at all. He reports "self-medicating" alcohol is back pain and mostly sitting around all day feeling depressed.  One week ago now, he quit drinking cold [place].  He does not report withdrawals (has never had them he thinks), but has been feeling "weak" all week until the day of presentation when he woke up feeling pain "all over my body from my head to my feet, with cramps and terrible muscle spasms".  Later in the morning, he was watching TV, felt "woozy" and got up to go to the bathroom and blacked out and fell on the ground.  When he woke up, he was groggy but had not urinated on himself or moved bowels and his sister called 9-1-1.  In the ED, he was hypotensive to 100/75, responsive to fluids. No fever. Na 134, K 2.9, blood glucose 55, creatinine 1.85 (from a baseline of 0.81 year ago), AST 42, WBC 10.8, Hgb 15.2, EtOH negative, lactate 3.5. Chest x-ray was clear. An ECG showed new lateral T-wave inversions but troponin was negative. K was started, fluids were administered, a CT head was normal, and TRH were asked to observe for syncope and hypotension.  The patient denied fever, chills, vomiting, diarrhea, abdominal pain, cough, sputum, dysuria, hematuria, urinary incontinence. He has had polyuria and polydipsia (for which he has been drinking Kool-Aid). He  also endorses depressed mood and anhedonia and , denies feelings of self-harm or harming others.  He drinks several large volume beers per day.     Review of Systems:  All other systems negative except as just noted or noted in the history of present illness.  Allergies  Allergen Reactions  . Penicillins Hives  . Bacitracin Itching, Swelling and Rash  . Septra [Sulfamethoxazole-Trimethoprim] Itching, Swelling and Rash    Prior to Admission medications   Medication Sig Start Date End Date Taking? Authorizing Provider  Insulin Glargine (LANTUS SOLOSTAR) 100 UNIT/ML Solostar Pen Inject 25 Units into the skin daily. Patient taking differently: Inject 20 Units into the skin daily.  10/04/14  Yes Quentin Angst, MD    Past Medical History  Diagnosis Date  . Acid reflux   . Acne rosacea   . Plantar fasciitis   . Thyroid disease   . Mood disorder (HCC)   . Diabetes mellitus without complication (HCC) Dx 2010  . Hyperlipidemia Dx 2010  . Hypertension Dx 2010  . Substance abuse     last alchohol intake 01/24/2013  . DVT (deep venous thrombosis) (HCC) 2012 and 2013     Left and Right leg, no history of PE      Past Surgical History  Procedure Laterality Date  . Pacemaker insertion  2014    Family history: family history includes Diabetes in his father, mother, and sister.  Social History: Patient lives with his  niece. He is an active smoker. He uses marijuana. He denies illicit drugs. He is from [place] originally, but was living for many years in [place].   He is on Disability.       Physical Exam: BP 118/74 mmHg  Pulse 78  Temp(Src) 98.2 F (36.8 C) (Oral)  Resp 19  Ht 6' (1.829 m)  Wt 78.1 kg (172 lb 2.9 oz)  BMI 23.35 kg/m2  SpO2 99% General appearance: Well-developed, adult male, initially lethargic, in pain, but with interviewing, slowly more alert and interactive.  Appears tired. Eyes: Anicteric, conjunctiva pink, lids and lashes normal.     ENT:  No nasal deformity, discharge, or epistaxis.  OP tacky without lesions.  Missing front teeth. Lymph: No cervical or supraclavicular lymphadenopathy. Skin: Warm and dry.  No suspicious rashes or lesions or bruises. Cardiac: RRR, nl S1-S2, no murmurs appreciated.  Capillary refill is brisk.  JVP normal.  No LE edema.  Radial and DP pulses 2+ and symmetric. Respiratory: Normal respiratory rate and rhythm.  CTAB without rales or wheezes. Abdomen: Abdomen soft without rigidity.  No particular TTP. No ascites, distension.   MSK: No deformities or effusions.  There is no vertebral tendreness.  Sitting up is painful, and the patient has bilateral lumbar paraspinal tenderness, which reproduces his pain. Neuro: Shaky when sitting up, no resting or intention tremor.  Initially lethargic, but with interaction, sensorium intact and responding to questions, slowly, attention normal.  Speech is fluent.  Moves all extremities equally and with 5-/ strength and normal coordination. Too weak to stand.  FTN symmetric but abnormal, suspect etoh related.  Cranial nerves normal.  No numbness to light touch on arms, legs.  Psych: Behavior appropriate.  Affect tearful at times.  No evidence of aural or visual hallucinations or delusions.  No suicidal or homicidal ideation.  Endorses depressed mood.      Labs on Admission:  The metabolic panel shows hyponatremia, hypokalemia, elevated creatinine. Slight transaminitis. Lactic acid level is elevated. Alcohol negative. UDS shows THC. Blood cultures are pending UA shows granular casts and glucosuria  The complete blood count shows no leukocytosis, anemia, thrombocytopenia.   Radiological Exams on Admission: Personally reviewed: Dg Chest 2 View  02/18/2016  CLINICAL DATA:  Syncope with tachypnea. EXAM: CHEST  2 VIEW COMPARISON:  09/18/2014 FINDINGS: Dual lead left-sided pacemaker remains in place.The cardiomediastinal contours are normal. The lungs are clear. Pulmonary  vasculature is normal. No consolidation, pleural effusion, or pneumothorax. No acute osseous abnormalities are seen. IMPRESSION: No acute process. Electronically Signed   By: Rubye Oaks M.D.   On: 02/18/2016 00:03   Dg Cervical Spine Complete  02/18/2016  CLINICAL DATA:  Syncope with neck pain tonight. EXAM: CERVICAL SPINE - COMPLETE 4+ VIEW COMPARISON:  None. FINDINGS: Cervical spine alignment is maintained. Vertebral body heights and intervertebral disc spaces are preserved. The dens is intact. Posterior elements appear well-aligned. Scattered facet arthropathy. There is no evidence of fracture. No prevertebral soft tissue edema. IMPRESSION: Minimal degenerative change without acute abnormality of the cervical spine. Electronically Signed   By: Rubye Oaks M.D.   On: 02/18/2016 00:04   Ct Head Wo Contrast  02/18/2016  CLINICAL DATA:  Acute onset of unwitnessed syncope and collapse at home. Headache. Hit left side of forehead. Initial encounter. EXAM: CT HEAD WITHOUT CONTRAST TECHNIQUE: Contiguous axial images were obtained from the base of the skull through the vertex without intravenous contrast. COMPARISON:  None. FINDINGS: There is no evidence of acute  infarction, mass lesion, or intra- or extra-axial hemorrhage on CT. Scattered periventricular and subcortical white matter change likely reflects small vessel ischemic microangiopathy. The posterior fossa, including the cerebellum, brainstem and fourth ventricle, is within normal limits. The third and lateral ventricles, and basal ganglia are unremarkable in appearance. The cerebral hemispheres are symmetric in appearance, with normal gray-white differentiation. No mass effect or midline shift is seen. There is no evidence of fracture; visualized osseous structures are unremarkable in appearance. The visualized portions of the orbits are within normal limits. The paranasal sinuses and mastoid air cells are well-aerated. No significant soft tissue  abnormalities are seen. IMPRESSION: 1. No acute intracranial pathology seen on CT. 2. Scattered small vessel ischemic microangiopathy. Electronically Signed   By: Roanna Raider M.D.   On: 02/18/2016 00:10    EKG: Independently reviewed. atrial paced, rate 60, QTC normal, inferior lateral T-wave inversions, new.     Assessment/Plan 1. Syncope and hypotension:  This is new.  Unclear cause.  Hypovolemia seems most likely, possibly in the setting of HHNK developing over a few days at home, then he treated himself with insulin, but was dehydrated from polyuria.  Sepsis is doubted at present.  Also possibly adrenal insufficiency or hypothyroidism (has a history of hypothyroidism but hasn't been taking any of his medicines "for months").  He denies overdose of illicit or prescribed medicines.  Will check cortisol, TSH, rehydrate and see if he improves.  Cultures are pending.   -Check orthostatics -Place on telemetry -Check echocardiogram -Fluids have been administered -Check AM cortisol and TSH   2. Elevated lactic acid:  This is new.  At present there is no WBC or fever, and also no symptoms suggestive of sepsis, and so this is favored to be from alcohol and dehydrati, which should improve with fluids alone. -Trend lactate to normal -Repeat fluids -Follow blood cultures  3. IDDM:  -Check HgbA1c -Hold home glargine given hypoglycemic at admission -Sliding scale corrections  4. AKI:  Fluids seem to be improving this.  No hematuria.  Last baseline was 0.8 in 2015. -Continue IVF and repeat BMP tomorrow  5. Hypokalemia:  Due to self-administered insulin at home.  Resolved with repeat BMP.  6. Hepatitis:  Likely alcohol related. -Abstain from alcohol and supportive care with fluids -Trend CMP  7. Abnormal ECG:  There is no chest pain.  Last ECG in 2013 showed no TWI. -Repeat TNI once, otherwise can follow up as an outpatient  8. Depression: -Consult to Psychiatry  9. History of  DVT, formerly on warfarin: -Check INR to see if patient has taken warfarin or has cirrhosis       DVT PPx: Lovenox Diet: Diabetic Consultants: Psychiatry Code Status: FULL Family Communication: None present  Medical decision making: What exists of the patient's previous chart was reviewed in depth and the case was discussed with Elpidio Anis. Patient seen 2:25 AM on 02/18/2016.  Disposition Plan:  I recommend admission to telemetry given syncope.  Clinical condition: stable.  Anticipate fluids and trend creatinine.  Repeat lactate and evaluation by psychiatry.  Disposition pending rseolution of AKI and lactate.      Alberteen Sam [facility] Pager [phone]

## 2016-02-19 ENCOUNTER — Inpatient Hospital Stay (HOSPITAL_COMMUNITY): Payer: Medicaid Other

## 2016-02-19 DIAGNOSIS — R55 Syncope and collapse: Secondary | ICD-10-CM

## 2016-02-19 DIAGNOSIS — E860 Dehydration: Secondary | ICD-10-CM | POA: Insufficient documentation

## 2016-02-19 DIAGNOSIS — I495 Sick sinus syndrome: Secondary | ICD-10-CM | POA: Insufficient documentation

## 2016-02-19 LAB — MAGNESIUM: MAGNESIUM: 1.6 mg/dL — AB (ref 1.7–2.4)

## 2016-02-19 LAB — COMPREHENSIVE METABOLIC PANEL
ALT: 62 U/L (ref 17–63)
AST: 98 U/L — ABNORMAL HIGH (ref 15–41)
Albumin: 3 g/dL — ABNORMAL LOW (ref 3.5–5.0)
Alkaline Phosphatase: 194 U/L — ABNORMAL HIGH (ref 38–126)
Anion gap: 7 (ref 5–15)
BUN: 6 mg/dL (ref 6–20)
CALCIUM: 8.9 mg/dL (ref 8.9–10.3)
CO2: 24 mmol/L (ref 22–32)
CREATININE: 0.9 mg/dL (ref 0.61–1.24)
Chloride: 101 mmol/L (ref 101–111)
Glucose, Bld: 292 mg/dL — ABNORMAL HIGH (ref 65–99)
POTASSIUM: 3.4 mmol/L — AB (ref 3.5–5.1)
SODIUM: 132 mmol/L — AB (ref 135–145)
TOTAL PROTEIN: 6.1 g/dL — AB (ref 6.5–8.1)
Total Bilirubin: 0.8 mg/dL (ref 0.3–1.2)

## 2016-02-19 LAB — HEMOGLOBIN A1C
HEMOGLOBIN A1C: 12.1 % — AB (ref 4.8–5.6)
MEAN PLASMA GLUCOSE: 301 mg/dL

## 2016-02-19 LAB — CBC
HCT: 34.6 % — ABNORMAL LOW (ref 39.0–52.0)
Hemoglobin: 12.3 g/dL — ABNORMAL LOW (ref 13.0–17.0)
MCH: 32.5 pg (ref 26.0–34.0)
MCHC: 35.5 g/dL (ref 30.0–36.0)
MCV: 91.3 fL (ref 78.0–100.0)
PLATELETS: 102 10*3/uL — AB (ref 150–400)
RBC: 3.79 MIL/uL — ABNORMAL LOW (ref 4.22–5.81)
RDW: 12.8 % (ref 11.5–15.5)
WBC: 5.3 10*3/uL (ref 4.0–10.5)

## 2016-02-19 LAB — GLUCOSE, CAPILLARY
GLUCOSE-CAPILLARY: 196 mg/dL — AB (ref 65–99)
Glucose-Capillary: 261 mg/dL — ABNORMAL HIGH (ref 65–99)
Glucose-Capillary: 217 mg/dL — ABNORMAL HIGH (ref 65–99)
Glucose-Capillary: 298 mg/dL — ABNORMAL HIGH (ref 65–99)

## 2016-02-19 LAB — HIV ANTIBODY (ROUTINE TESTING W REFLEX): HIV SCREEN 4TH GENERATION: NONREACTIVE

## 2016-02-19 LAB — VITAMIN B12: Vitamin B-12: 677 pg/mL (ref 180–914)

## 2016-02-19 LAB — D-DIMER, QUANTITATIVE (NOT AT ARMC): D DIMER QUANT: 0.62 ug{FEU}/mL — AB (ref 0.00–0.50)

## 2016-02-19 MED ORDER — WARFARIN SODIUM 10 MG PO TABS
10.0000 mg | ORAL_TABLET | Freq: Once | ORAL | Status: AC
  Administered 2016-02-19: 10 mg via ORAL
  Filled 2016-02-19: qty 1

## 2016-02-19 MED ORDER — GABAPENTIN 100 MG PO CAPS
200.0000 mg | ORAL_CAPSULE | Freq: Three times a day (TID) | ORAL | Status: DC
  Administered 2016-02-19 – 2016-02-22 (×10): 200 mg via ORAL
  Filled 2016-02-19 (×9): qty 2

## 2016-02-19 MED ORDER — INSULIN GLARGINE 100 UNIT/ML ~~LOC~~ SOLN
16.0000 [IU] | Freq: Every day | SUBCUTANEOUS | Status: DC
  Administered 2016-02-19: 16 [IU] via SUBCUTANEOUS
  Filled 2016-02-19 (×2): qty 0.16

## 2016-02-19 MED ORDER — POTASSIUM CHLORIDE CRYS ER 20 MEQ PO TBCR
20.0000 meq | EXTENDED_RELEASE_TABLET | Freq: Once | ORAL | Status: AC
  Administered 2016-02-19: 20 meq via ORAL
  Filled 2016-02-19: qty 1

## 2016-02-19 MED ORDER — MAGNESIUM SULFATE 2 GM/50ML IV SOLN
2.0000 g | Freq: Once | INTRAVENOUS | Status: AC
  Administered 2016-02-19: 2 g via INTRAVENOUS
  Filled 2016-02-19: qty 50

## 2016-02-19 MED ORDER — ENOXAPARIN SODIUM 80 MG/0.8ML ~~LOC~~ SOLN
1.0000 mg/kg | Freq: Two times a day (BID) | SUBCUTANEOUS | Status: DC
  Administered 2016-02-19 – 2016-02-22 (×6): 80 mg via SUBCUTANEOUS
  Filled 2016-02-19 (×6): qty 0.8

## 2016-02-19 MED ORDER — WARFARIN - PHARMACIST DOSING INPATIENT: Freq: Every day | Status: DC

## 2016-02-19 NOTE — Progress Notes (Signed)
Inpatient Diabetes Program Recommendations  AACE/ADA: New Consensus Statement on Inpatient Glycemic Control (2015)  Target Ranges:  Prepandial:   less than 140 mg/dL      Peak postprandial:   less than 180 mg/dL (1-2 hours)      Critically ill patients:  140 - 180 mg/dL   Review of Glycemic Control:  Results for KANNEN, MOXEY (MRN [id number]) as of 02/19/2016 16:20  Ref. Range 02/18/2016 11:51 02/18/2016 17:04 02/18/2016 22:02 02/19/2016 07:54 02/19/2016 12:27  Glucose-Capillary Latest Ref Range: 65-99 mg/dL 409 (H) 811 (H) 914 (H) 261 (H) 196 (H)    Inpatient Diabetes Program Recommendations:    Spoke with patient regarding home diabetes management.  He states that he uses insulin pens for both Lantus and Novolog at home.  He admits that he has not been taking medications other then insulin for the past few months.  Discussed results of A1C with patient and he states that his blood sugars used to be better when he was in [place].  He voices frustration with his MD here in  as he has with other providers.  Needs PCP for follow-up regarding diabetes.  Patient states that he takes Novolog "based on what he thinks he needs".  He needs clear instructions at discharge regarding insulin administration.  Blood sugars are improved today.  Will follow.  Patient states he does need refills on his insulin pens as he is almost out.  Thanks, Beryl Meager, RN, BC-ADM Inpatient Diabetes Coordinator Pager [phone] (8a-5p)

## 2016-02-19 NOTE — Progress Notes (Signed)
Inpatient Diabetes Program Recommendations  AACE/ADA: New Consensus Statement on Inpatient Glycemic Control (2015)  Target Ranges:  Prepandial:   less than 140 mg/dL      Peak postprandial:   less than 180 mg/dL (1-2 hours)      Critically ill patients:  140 - 180 mg/dL   Review of Glycemic Control:   Results for ANIVAL, PASHA (MRN [id number]) as of 02/19/2016 14:04  Ref. Range 02/18/2016 11:51 02/18/2016 17:04 02/18/2016 22:02 02/19/2016 07:54 02/19/2016 12:27  Glucose-Capillary Latest Ref Range: 65-99 mg/dL 409 (H) 811 (H) 914 (H) 261 (H) 196 (H)   Inpatient Diabetes Program Recommendations:    Please consider increasing Lantus to 20 units q HS (as this is home dose).  Thanks, Beryl Meager, RN, BC-ADM Inpatient Diabetes Coordinator Pager [phone] (8a-5p)

## 2016-02-19 NOTE — Progress Notes (Signed)
Device interrogation requested to rule out arrhythmic cause of syncope. Normal device function Battery longevity 11 years Pt had PPM implanted in [place] for symptomatic bradycardia. Underlying rhythm SR Short AT episodes with 1:1 AV conduction but none on the day of admission He is now living in [place] I will arrange pacemaker follow up in our office and transfer remotes to our clinic  Gypsy Balsam, NP 02/19/2016 4:33 PM  Hillis Range MD, [facility] 02/19/2016 6:12 PM

## 2016-02-19 NOTE — Progress Notes (Addendum)
Patient  verbally abusive to staffs ,ranting racial slurs to the staffs.Instructed nurse who helped hi and the assistant nurse to leave patient alone  And close the door as patient is verbally ranting racial slurs.-The previous note is not for this patient/chart.Wrong chart entry.

## 2016-02-19 NOTE — Progress Notes (Signed)
PROGRESS NOTE  Patrick Small NGE:[id number] DOB: 1961/09/17 DOA: 02/17/2016 PCP: Lora Paula, MD  Brief History 55 year old male with a history of insulin-dependent diabetes mellitus, sick sinus syndrome status post PPM, alcohol dependence presented with generalized pain all over and syncope. The patient has been self-medicating himself with alcohol for chronic back pain. He drinks at least 2 x 40 oz beers daily with occasional liquor. He quit drinking about one week prior to this admission. On the day of admission, he woke up with generalized body aches and was going to the bathroom when he had a syncopal episode. He states that he has not been taking his insulin faithfully. He checked his sugars earlier on the day of admission and noted it to be >600. He gave himself 40 units of insulin although he cannot tell me the type of insulin (MAR lists Lantus). In the ED he was actually hypoglycemic with symptoms because of 55. The patient was noted to have acute kidney injury with a serum creatinine 1.5 and lactic acid 3.5. The patient was started on fluid resuscitation. He also had relative hypotension with systolic blood pressure of 95 in the emergency department. Please note pt has essentially not taken any medications for over 8 months.  Assessment/Plan: Syncope  -likely secondary to volume depletion - patient has been having polydipsia and polyuria for the past week  -Patient likely had HONK at home -However in a patient with sick sinus syndrome and permanent pacemaker, cannot rule out cardiac dysrhythmia--consult cardiology, interrogate pacemaker -orthostatics--neg -echo--pending -IVF -UDS positive for THC -EKG paced, nonspecific T wave changes Lactic acidosis  -Secondary to volume depletion  -Continue IV fluids  -follow blood cultures--neg to date -CXR neg for infiltrates Diabetes mellitus type 2 with polyneuropathy -Hemoglobin A1c--12.1  -Increase Lantus 15  units  -NovoLog sliding scale--change to moderate scale -Start gabapentin -consult dietician Acute kidney injury  -Secondary to volume depletion  -Improving with hydration  History of DVT - this occurred in [place], therefore medical records are not available  -According to the patient's prior PCP the patient had a DVT in 2010, and December 2013  -He had been on warfarin, but has not taken it for 8 months  -Check d-dimer to risk stratify his risk for recurrent thromboembolism  -Restart Coumadin with Lovenox bridge  Transaminasemia - hepatitis B surface antigen  -Hepatitis C antibody  -HIV--neg -RUQ ultrasound Depression -appreciate pyschiatry -started cymbalta Hyponatremia -secondary to volume depletion -improved with IVF Hypokalemia/hypomagnesemia  -repleted -check mag-- 1.6  Chronic back pain -X-ray thoracic and lumbar spine--negative for fractures or dislocations  History of HTN -essentially normotensive in past 24-36 hours without anti-HTN meds -monitor Hx Hypothyroidism -TSH 0.443 -recheck TSH in one month  Family Communication: Sister updated at beside 02/19/16 Disposition Plan: Home 1-2 days pending cardiology eval     Procedures/Studies: Dg Chest 2 View  02/18/2016  CLINICAL DATA:  Syncope with tachypnea. EXAM: CHEST  2 VIEW COMPARISON:  09/18/2014 FINDINGS: Dual lead left-sided pacemaker remains in place.The cardiomediastinal contours are normal. The lungs are clear. Pulmonary vasculature is normal. No consolidation, pleural effusion, or pneumothorax. No acute osseous abnormalities are seen. IMPRESSION: No acute process. Electronically Signed   By: Rubye Oaks M.D.   On: 02/18/2016 00:03   Dg Cervical Spine Complete  02/18/2016  CLINICAL DATA:  Syncope with neck pain tonight. EXAM: CERVICAL SPINE - COMPLETE 4+ VIEW COMPARISON:  None. FINDINGS: Cervical spine alignment is maintained. Vertebral body  heights and intervertebral disc spaces are  preserved. The dens is intact. Posterior elements appear well-aligned. Scattered facet arthropathy. There is no evidence of fracture. No prevertebral soft tissue edema. IMPRESSION: Minimal degenerative change without acute abnormality of the cervical spine. Electronically Signed   By: Rubye Oaks M.D.   On: 02/18/2016 00:04   Dg Thoracic Spine 2 View  02/18/2016  CLINICAL DATA:  Chronic back pain. EXAM: LUMBAR SPINE - 2-3 VIEW; THORACIC SPINE 2 VIEWS COMPARISON:  Chest x-ray 02/17/2016 FINDINGS: Thoracic spine: Normal alignment of the thoracic vertebral bodies. Mild compression deformities of T8, T7 and T5 appear stable. Stable degenerative changes. No destructive bony changes or abnormal paraspinal soft tissue swelling. Pacer wires are noted. Lumbar spine: Normal alignment of the lumbar vertebral bodies. No acute fracture or destructive bony change. Mild facet degenerative changes at L4-5 and L5-S1 but no definite pars defects. The visualized bony pelvis is intact and the SI joints appear normal. IMPRESSION: 1. Degenerative changes and probable remote mild thoracic compression deformities. 2. Normal alignment and no acute bony findings in the lumbar spine. Electronically Signed   By: Rudie Meyer M.D.   On: 02/18/2016 21:35   Dg Lumbar Spine 2-3 Views  02/18/2016  CLINICAL DATA:  Chronic back pain. EXAM: LUMBAR SPINE - 2-3 VIEW; THORACIC SPINE 2 VIEWS COMPARISON:  Chest x-ray 02/17/2016 FINDINGS: Thoracic spine: Normal alignment of the thoracic vertebral bodies. Mild compression deformities of T8, T7 and T5 appear stable. Stable degenerative changes. No destructive bony changes or abnormal paraspinal soft tissue swelling. Pacer wires are noted. Lumbar spine: Normal alignment of the lumbar vertebral bodies. No acute fracture or destructive bony change. Mild facet degenerative changes at L4-5 and L5-S1 but no definite pars defects. The visualized bony pelvis is intact and the SI joints appear normal.  IMPRESSION: 1. Degenerative changes and probable remote mild thoracic compression deformities. 2. Normal alignment and no acute bony findings in the lumbar spine. Electronically Signed   By: Rudie Meyer M.D.   On: 02/18/2016 21:35   Ct Head Wo Contrast  02/18/2016  CLINICAL DATA:  Acute onset of unwitnessed syncope and collapse at home. Headache. Hit left side of forehead. Initial encounter. EXAM: CT HEAD WITHOUT CONTRAST TECHNIQUE: Contiguous axial images were obtained from the base of the skull through the vertex without intravenous contrast. COMPARISON:  None. FINDINGS: There is no evidence of acute infarction, mass lesion, or intra- or extra-axial hemorrhage on CT. Scattered periventricular and subcortical white matter change likely reflects small vessel ischemic microangiopathy. The posterior fossa, including the cerebellum, brainstem and fourth ventricle, is within normal limits. The third and lateral ventricles, and basal ganglia are unremarkable in appearance. The cerebral hemispheres are symmetric in appearance, with normal gray-white differentiation. No mass effect or midline shift is seen. There is no evidence of fracture; visualized osseous structures are unremarkable in appearance. The visualized portions of the orbits are within normal limits. The paranasal sinuses and mastoid air cells are well-aerated. No significant soft tissue abnormalities are seen. IMPRESSION: 1. No acute intracranial pathology seen on CT. 2. Scattered small vessel ischemic microangiopathy. Electronically Signed   By: Roanna Raider M.D.   On: 02/18/2016 00:10         Subjective: Patient denies fevers, chills, headache, chest pain, dyspnea, nausea, vomiting, diarrhea, abdominal pain, dysuria, hematuria. Patient complains of low back pain and numbness and tingling in his lower legs.   Objective: Filed Vitals:   02/18/16 1610 02/18/16 9604 02/18/16 1700 02/18/16 2022  BP: 118/74 105/63 157/105 120/87  Pulse: 78  64 59 62  Temp: 98.2 F (36.8 C) 98.8 F (37.1 C) 98.8 F (37.1 C) 99.1 F (37.3 C)  TempSrc: Oral Oral Oral Oral  Resp: Height: 6' (1.829 m)     Weight: 78.1 kg (172 lb 2.9 oz)   79.1 kg (174 lb 6.1 oz)  SpO2: 99% 100% 100% 100%    Intake/Output Summary (Last 24 hours) at 02/19/16 1604 Last data filed at 02/19/16 1300  Gross per 24 hour  Intake   2960 ml  Output   2400 ml  Net    560 ml   Weight change: 1 kg (2 lb 3.3 oz) Exam:   General:  Pt is alert, follows commands appropriately, not in acute distress  HEENT: No icterus, No thrush, No neck mass, [place]/AT  Cardiovascular: RRR, S1/S2, no rubs, no gallops  Respiratory: CTA bilaterally, no wheezing, no crackles, no rhonchi  Abdomen: Soft/+BS, non tender, non distended, no guarding; no hepatosplenomegaly   Extremities: No edema, No lymphangitis, No petechiae, No rashes, no synovitis; clubbing without cyanosis   Data Reviewed: Basic Metabolic Panel:  Recent Labs Lab 02/17/16 2256 02/18/16 0230 02/18/16 0244 02/18/16 0612 02/19/16 0643  NA 134*  --  139  --  132*  K 2.9*  --  3.9  --  3.4*  CL 94*  --  101  --  101  CO2 26  --  27  --  24  GLUCOSE 55*  --  223*  --  292*  BUN 9  --  9  --  6  CREATININE 1.85*  --  1.38* 1.41* 0.90  CALCIUM 10.1  --  9.6  --  8.9  MG  --  1.8  --   --  1.6*   Liver Function Tests:  Recent Labs Lab 02/17/16 2256 02/19/16 0643  AST 42* 98*  ALT 40 62  ALKPHOS 241* 194*  BILITOT 0.6 0.8  PROT 8.1 6.1*  ALBUMIN 4.2 3.0*   No results for input(s): LIPASE, AMYLASE in the last 168 hours. No results for input(s): AMMONIA in the last 168 hours. CBC:  Recent Labs Lab 02/17/16 2256 02/18/16 0244 02/19/16 0643  WBC 10.8* 9.3 5.3  NEUTROABS 7.2  --   --   HGB 15.2 12.9* 12.3*  HCT 42.1 36.0* 34.6*  MCV 91.7 92.3 91.3  PLT 162 130* 102*   Cardiac Enzymes:  Recent Labs Lab 02/18/16 0230 02/18/16 0612  TROPONINI <0.03 <0.03   BNP: Invalid input(s):  POCBNP CBG:  Recent Labs Lab 02/18/16 1151 02/18/16 1704 02/18/16 2202 02/19/16 0754 02/19/16 1227  GLUCAP 311* 331* 219* 261* 196*    Recent Results (from the past 240 hour(s))  Blood culture (routine x 2)     Status: None (Preliminary result)   Collection Time: 02/18/16  2:33 AM  Result Value Ref Range Status   Specimen Description BLOOD RIGHT ANTECUBITAL  Final   Special Requests IN PEDIATRIC BOTTLE 4CC  Final   Culture NO GROWTH 1 DAY  Final   Report Status PENDING  Incomplete  Blood culture (routine x 2)     Status: None (Preliminary result)   Collection Time: 02/18/16  2:36 AM  Result Value Ref Range Status   Specimen Description BLOOD LEFT ANTECUBITAL  Final   Special Requests IN PEDIATRIC BOTTLE 4CC  Final   Culture NO GROWTH 1 DAY  Final   Report Status PENDING  Incomplete  Scheduled Meds: . DULoxetine  30 mg Oral Daily  . enoxaparin (LOVENOX) injection  1 mg/kg Subcutaneous Q12H  . insulin aspart  0-5 Units Subcutaneous QHS  . insulin aspart  0-9 Units Subcutaneous TID WC  . insulin glargine  16 Units Subcutaneous QHS  . magnesium sulfate 1 - 4 g bolus IVPB  2 g Intravenous Once  . potassium chloride  20 mEq Oral Once  . sodium chloride flush  3 mL Intravenous Q12H   Continuous Infusions: . sodium chloride 125 mL/hr at 02/19/16 1011     Reno Clasby, DO  [facility] Pager [phone]  If 7PM-7AM, please contact night-coverage [email or web address] Password [email or web address] 02/19/2016, 4:04 PM   LOS: 1 day

## 2016-02-19 NOTE — Progress Notes (Signed)
ANTICOAGULATION CONSULT NOTE - Initial Consult  Pharmacy Consult for Coumadin Indication: History of DVT  Allergies  Allergen Reactions  . Penicillins Hives  . Bacitracin Itching, Swelling and Rash  . Septra [Sulfamethoxazole-Trimethoprim] Itching, Swelling and Rash    Patient Measurements: Height: 6' (182.9 cm) Weight: 174 lb 6.1 oz (79.1 kg) IBW/kg (Calculated) : 77.6 Heparin Dosing Weight:   Vital Signs:    Labs:  Recent Labs  02/17/16 2256 02/18/16 0230 02/18/16 0244 02/18/16 0612 02/19/16 0643  HGB 15.2  --  12.9*  --  12.3*  HCT 42.1  --  36.0*  --  34.6*  PLT 162  --  130*  --  102*  LABPROT  --   --  14.9  --   --   INR  --   --  1.15  --   --   CREATININE 1.85*  --  1.38* 1.41* 0.90  TROPONINI  --  <0.03  --  <0.03  --     Estimated Creatinine Clearance: 103 mL/min (by C-G formula based on Cr of 0.9).   Medical History: Past Medical History  Diagnosis Date  . Acid reflux   . Acne rosacea   . Plantar fasciitis   . Thyroid disease   . Mood disorder (HCC)   . Diabetes mellitus without complication (HCC) Dx 2010  . Hyperlipidemia Dx 2010  . Hypertension Dx 2010  . Substance abuse     last alchohol intake 01/24/2013  . DVT (deep venous thrombosis) (HCC) 2012 and 2013     Left and Right leg, no history of PE      Medications:  Scheduled:  . DULoxetine  30 mg Oral Daily  . enoxaparin (LOVENOX) injection  1 mg/kg Subcutaneous Q12H  . gabapentin  200 mg Oral TID  . insulin aspart  0-5 Units Subcutaneous QHS  . insulin aspart  0-9 Units Subcutaneous TID WC  . insulin glargine  16 Units Subcutaneous QHS  . magnesium sulfate 1 - 4 g bolus IVPB  2 g Intravenous Once  . potassium chloride  20 mEq Oral Once  . sodium chloride flush  3 mL Intravenous Q12H    Assessment: 55yo male with hx DVT in 2010 and 2013; he has been off Coumadin for 49mo and has been "self-medicating" his back pain with alcohol.  Plan to resume Coumadin with Lovenox bridge and  check D-dimer.  INR was 1.15 on 2/20.  Hg 12.3 and pltc 102 today (down from 162 -> 130 -> 102 on 2/19).  He has received Lovenox  SQ daily x 2 doses, now increased to  SQ q12 to start this evening.     Goal of Therapy:  INR 2-3 Monitor platelets by anticoagulation protocol: Yes   Plan:  Coumadin  po x 1 Daily INR and CBC Watch pltc closely Watch for s/s bleeding.  Marisue Humble, PharmD Clinical Pharmacist [facility]

## 2016-02-19 NOTE — Progress Notes (Signed)
  Echocardiogram 2D Echocardiogram has been performed.  Arvil Chaco 02/19/2016, 2:13 PM

## 2016-02-20 ENCOUNTER — Inpatient Hospital Stay (HOSPITAL_COMMUNITY): Payer: Medicaid Other

## 2016-02-20 DIAGNOSIS — N179 Acute kidney failure, unspecified: Secondary | ICD-10-CM

## 2016-02-20 DIAGNOSIS — I10 Essential (primary) hypertension: Secondary | ICD-10-CM

## 2016-02-20 DIAGNOSIS — R55 Syncope and collapse: Secondary | ICD-10-CM

## 2016-02-20 LAB — GLUCOSE, CAPILLARY
GLUCOSE-CAPILLARY: 161 mg/dL — AB (ref 65–99)
GLUCOSE-CAPILLARY: 259 mg/dL — AB (ref 65–99)
GLUCOSE-CAPILLARY: 200 mg/dL — AB (ref 65–99)
Glucose-Capillary: 187 mg/dL — ABNORMAL HIGH (ref 65–99)
Glucose-Capillary: 214 mg/dL — ABNORMAL HIGH (ref 65–99)

## 2016-02-20 LAB — COMPREHENSIVE METABOLIC PANEL
ALBUMIN: 3.5 g/dL (ref 3.5–5.0)
ALK PHOS: 235 U/L — AB (ref 38–126)
ALT: 74 U/L — ABNORMAL HIGH (ref 17–63)
ANION GAP: 11 (ref 5–15)
AST: 99 U/L — ABNORMAL HIGH (ref 15–41)
BUN: <5 mg/dL — ABNORMAL LOW (ref 6–20)
CALCIUM: 9.3 mg/dL (ref 8.9–10.3)
CHLORIDE: 102 mmol/L (ref 101–111)
CO2: 24 mmol/L (ref 22–32)
Creatinine, Ser: 0.74 mg/dL (ref 0.61–1.24)
GFR calc non Af Amer: >60 mL/min (ref >60)
GLUCOSE: 201 mg/dL — AB (ref 65–99)
POTASSIUM: 3.8 mmol/L (ref 3.5–5.1)
SODIUM: 137 mmol/L (ref 135–145)
Total Bilirubin: 0.8 mg/dL (ref 0.3–1.2)
Total Protein: 6.7 g/dL (ref 6.5–8.1)

## 2016-02-20 LAB — CBC
HCT: 37.1 % — ABNORMAL LOW (ref 39.0–52.0)
Hemoglobin: 13.3 g/dL (ref 13.0–17.0)
MCH: 32.7 pg (ref 26.0–34.0)
MCHC: 35.8 g/dL (ref 30.0–36.0)
MCV: 91.2 fL (ref 78.0–100.0)
PLATELETS: 114 10*3/uL — AB (ref 150–400)
RBC: 4.07 MIL/uL — ABNORMAL LOW (ref 4.22–5.81)
RDW: 12.7 % (ref 11.5–15.5)
WBC: 4.6 10*3/uL (ref 4.0–10.5)

## 2016-02-20 LAB — HEPATITIS B SURFACE ANTIGEN: Hepatitis B Surface Ag: NEGATIVE

## 2016-02-20 LAB — PROTIME-INR
INR: 1.09 (ref 0.00–1.49)
Prothrombin Time: 14.3 seconds (ref 11.6–15.2)

## 2016-02-20 LAB — MAGNESIUM: MAGNESIUM: 2 mg/dL (ref 1.7–2.4)

## 2016-02-20 LAB — HEPATITIS C ANTIBODY

## 2016-02-20 MED ORDER — HYDRALAZINE HCL 20 MG/ML IJ SOLN
10.0000 mg | Freq: Once | INTRAMUSCULAR | Status: AC
  Administered 2016-02-20: 10 mg via INTRAVENOUS
  Filled 2016-02-20: qty 1

## 2016-02-20 MED ORDER — OXYCODONE HCL 5 MG PO TABS
5.0000 mg | ORAL_TABLET | Freq: Four times a day (QID) | ORAL | Status: DC | PRN
  Administered 2016-02-20 – 2016-02-22 (×6): 10 mg via ORAL
  Filled 2016-02-20 (×7): qty 2

## 2016-02-20 MED ORDER — INSULIN GLARGINE 100 UNIT/ML ~~LOC~~ SOLN
20.0000 [IU] | Freq: Every day | SUBCUTANEOUS | Status: DC
  Administered 2016-02-20 – 2016-02-21 (×2): 20 [IU] via SUBCUTANEOUS
  Filled 2016-02-20 (×3): qty 0.2

## 2016-02-20 MED ORDER — WARFARIN SODIUM 10 MG PO TABS
10.0000 mg | ORAL_TABLET | Freq: Once | ORAL | Status: AC
  Administered 2016-02-20: 10 mg via ORAL
  Filled 2016-02-20: qty 1

## 2016-02-20 NOTE — Progress Notes (Signed)
PROGRESS NOTE  Torres Hardenbrook ZOX:[id number] DOB: 1961-12-22 DOA: 02/17/2016 PCP: Lora Paula, MD  Brief History 55 year old male with a history of insulin-dependent diabetes mellitus, sick sinus syndrome status post PPM, alcohol dependence presented with generalized pain all over and syncope. The patient has been self-medicating himself with alcohol for chronic back pain. He drinks at least 2 x 40 oz beers daily with occasional liquor. He quit drinking about one week prior to this admission. On the day of admission, he woke up with generalized body aches and was going to the bathroom when he had a syncopal episode. He states that he has not been taking his insulin faithfully. He checked his sugars earlier on the day of admission and noted it to be >600. He gave himself 40 units of insulin although he cannot tell me the type of insulin (MAR lists Lantus). In the ED he was actually hypoglycemic with symptoms because of 55. The patient was noted to have acute kidney injury with a serum creatinine 1.5 and lactic acid 3.5. The patient was started on fluid resuscitation. He also had relative hypotension with systolic blood pressure of 95 in the emergency department. Please note pt has essentially not taken any medications for over 8 months.  Assessment/Plan: Syncope  -likely secondary to volume depletion - patient has been having polydipsia and polyuria for the past week  -Patient likely hadHONK at home -However in a patient with sick sinus syndrome and permanent pacemaker, cannot rule out cardiac dysrhythmia--consult cardiology, interrogate pacemaker. Cardiology input 2/21 appreciated: Normal device function. Cardiology will arrange outpatient follow-up. -orthostatics--neg -echo-- 02/19/16: LVEF 55-60 percent. 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 Please see discussion below. -UDS positive for THC -EKG paced, nonspecific T wave changes ?  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 Blood cultures 2 from admission: negative to date. Cardiology to follow up-consideration for TEE. Hold off antibiotics for now in the absence of fevers or leukocytosis. Consider ID consultation in a.m. Lactic acidosis  -Secondary to volume depletion  - Resolved after IV fluids. -follow blood cultures--neg to date -CXR neg for infiltrates Diabetes mellitus type 2 with polyneuropathy -Hemoglobin A1c--12.1  -Increase Lantus 15 units . Uncontrolled. Adjust insulin's as needed. -NovoLog sliding scale--change to moderate scale -Start gabapentin -consult dietician Acute kidney injury  -Secondary to volume depletion  - resolved. History of DVT - this occurred in [place], therefore medical records are not available  -According to the patient's prior PCP the patient had a DVT in 2010, and December 2013  -He had been on warfarin, but has not taken it for 8 months  -Check d-dimer to risk stratify his risk for recurrent thromboembolism -D-dimer positive.   -Restarted Coumadin with Lovenox bridge  Transaminasemia - hepatitis B surface antigen  -Hepatitis C antibody  -HIV--neg -RUQ ultrasound: Report as below. No acute issues. - Unclear etiology. Check CK to rule out rhabdomyolysis. Follow LFTs. Depression -appreciate pyschiatry -started cymbalta Hyponatremia -secondary to volume depletion - Resolved.  Hypokalemia/hypomagnesemia  -repleted -check mag-- 1.6  Chronic back pain -X-ray thoracic and lumbar spine--negative for fractures or dislocations  History of HTN - Fluctuating and mildly uncontrolled -monitor Hx Hypothyroidism -TSH 0.443 -recheck TSH in one month Thrombocytopenia - Stable. Follow CBCs.    DVT prophylaxis: On Lovenox bridge and Coumadin  CODE STATUS: Full  Family Communication: None at bedside  Disposition Plan: DC home when medically  stable.  Procedures/Studies: Dg Chest 2 View  02/18/2016  CLINICAL DATA:  Syncope with tachypnea. EXAM: CHEST  2 VIEW COMPARISON:  09/18/2014 FINDINGS: Dual lead left-sided pacemaker remains in place.The cardiomediastinal contours are normal. The lungs are clear. Pulmonary vasculature is normal. No consolidation, pleural effusion, or pneumothorax. No acute osseous abnormalities are seen. IMPRESSION: No acute process. Electronically Signed   By: Rubye Oaks M.D.   On: 02/18/2016 00:03   Dg Cervical Spine Complete  02/18/2016  CLINICAL DATA:  Syncope with neck pain tonight. EXAM: CERVICAL SPINE - COMPLETE 4+ VIEW COMPARISON:  None. FINDINGS: Cervical spine alignment is maintained. Vertebral body heights and intervertebral disc spaces are preserved. The dens is intact. Posterior elements appear well-aligned. Scattered facet arthropathy. There is no evidence of fracture. No prevertebral soft tissue edema. IMPRESSION: Minimal degenerative change without acute abnormality of the cervical spine. Electronically Signed   By: Rubye Oaks M.D.   On: 02/18/2016 00:04   Dg Thoracic Spine 2 View  02/18/2016  CLINICAL DATA:  Chronic back pain. EXAM: LUMBAR SPINE - 2-3 VIEW; THORACIC SPINE 2 VIEWS COMPARISON:  Chest x-ray 02/17/2016 FINDINGS: Thoracic spine: Normal alignment of the thoracic vertebral bodies. Mild compression deformities of T8, T7 and T5 appear stable. Stable degenerative changes. No destructive bony changes or abnormal paraspinal soft tissue swelling. Pacer wires are noted. Lumbar spine: Normal alignment of the lumbar vertebral bodies. No acute fracture or destructive bony change. Mild facet degenerative changes at L4-5 and L5-S1 but no definite pars defects. The visualized bony pelvis is intact and the SI joints appear normal. IMPRESSION: 1. Degenerative changes and probable remote mild thoracic compression deformities. 2. Normal alignment and no acute bony findings in the lumbar spine.  Electronically Signed   By: Rudie Meyer M.D.   On: 02/18/2016 21:35   Dg Lumbar Spine 2-3 Views  02/18/2016  CLINICAL DATA:  Chronic back pain. EXAM: LUMBAR SPINE - 2-3 VIEW; THORACIC SPINE 2 VIEWS COMPARISON:  Chest x-ray 02/17/2016 FINDINGS: Thoracic spine: Normal alignment of the thoracic vertebral bodies. Mild compression deformities of T8, T7 and T5 appear stable. Stable degenerative changes. No destructive bony changes or abnormal paraspinal soft tissue swelling. Pacer wires are noted. Lumbar spine: Normal alignment of the lumbar vertebral bodies. No acute fracture or destructive bony change. Mild facet degenerative changes at L4-5 and L5-S1 but no definite pars defects. The visualized bony pelvis is intact and the SI joints appear normal. IMPRESSION: 1. Degenerative changes and probable remote mild thoracic compression deformities. 2. Normal alignment and no acute bony findings in the lumbar spine. Electronically Signed   By: Rudie Meyer M.D.   On: 02/18/2016 21:35   Ct Head Wo Contrast  02/18/2016  CLINICAL DATA:  Acute onset of unwitnessed syncope and collapse at home. Headache. Hit left side of forehead. Initial encounter. EXAM: CT HEAD WITHOUT CONTRAST TECHNIQUE: Contiguous axial images were obtained from the base of the skull through the vertex without intravenous contrast. COMPARISON:  None. FINDINGS: There is no evidence of acute infarction, mass lesion, or intra- or extra-axial hemorrhage on CT. Scattered periventricular and subcortical white matter change likely reflects small vessel ischemic microangiopathy. The posterior fossa, including the cerebellum, brainstem and fourth ventricle, is within normal limits. The third and lateral ventricles, and basal ganglia are unremarkable in appearance. The cerebral hemispheres are symmetric in appearance, with normal gray-white differentiation. No mass effect or midline shift is seen. There is no evidence of fracture; visualized osseous structures  are unremarkable in appearance. The visualized  portions of the orbits are within normal limits. The paranasal sinuses and mastoid air cells are well-aerated. No significant soft tissue abnormalities are seen. IMPRESSION: 1. No acute intracranial pathology seen on CT. 2. Scattered small vessel ischemic microangiopathy. Electronically Signed   By: Roanna Raider M.D.   On: 02/18/2016 00:10   US Abdomen Limited Ruq  02/20/2016  CLINICAL DATA:  Increased transaminases EXAM: US ABDOMEN LIMITED - RIGHT UPPER QUADRANT COMPARISON:  02/04/2010 FINDINGS: Gallbladder: No shadowing gallstones are noted within gallbladder. There is 5.6 mm gallbladder wall polyp. No thickening of gallbladder wall. No sonographic Murphy's sign. Common bile duct: Diameter: 3 mm in diameter within normal limits. Liver: No focal lesion identified. Within normal limits in parenchymal echogenicity. IMPRESSION: No shadowing gallstones are noted within gallbladder. There is 5.6 mm gallbladder wall polyp. No thickening of gallbladder wall. No sonographic Murphy's sign. Normal CBD. Electronically Signed   By: Natasha Mead M.D.   On: 02/20/2016 08:04        Subjective: Complaints of chronic lower extremity neuropathy pain and ambulating the halls which helps. Denies chest pain, fever, cough, dyspnea or palpitations.   Objective: Filed Vitals:   02/20/16 0456 02/20/16 0504 02/20/16 0542 02/20/16 1031  BP:  161/103 157/105 152/90  Pulse:  60  68  Temp:  98.1 F (36.7 C)  98.4 F (36.9 C)  TempSrc:  Oral  Oral  Resp:  15  16  Height:      Weight: 80.015 kg (176 lb 6.4 oz)     SpO2:  100%  100%    Intake/Output Summary (Last 24 hours) at 02/20/16 1736 Last data filed at 02/20/16 1413  Gross per 24 hour  Intake    480 ml  Output   2000 ml  Net  -1520 ml   Weight change: 5.8 kg (12 lb 12.6 oz) Exam:   General:  Pt is alert, follows commands appropriately, not in acute distress. Patient sitting up comfortably in chair. Does  not look septic or toxic.   HEENT: No icterus, No thrush, No neck mass, [place]/AT  Cardiovascular: RRR, S1/S2, no rubs, no gallops. Telemetry: A paced rhythm.   Respiratory: CTA bilaterally, no wheezing, no crackles, no rhonchi  Abdomen: Soft/+BS, non tender, non distended, no guarding; no hepatosplenomegaly   Extremities: No edema, No lymphangitis, No petechiae, No rashes, no synovitis; clubbing without cyanosis   CNS: Alert and oriented. No focal deficits.  Data Reviewed: Basic Metabolic Panel:  Recent Labs Lab 02/17/16 2256 02/18/16 0230 02/18/16 0244 02/18/16 0612 02/19/16 0643 02/20/16 0821  NA 134*  --  139  --  132* 137  K 2.9*  --  3.9  --  3.4* 3.8  CL 94*  --  101  --  101 102  CO2 26  --  27  --  24 24  GLUCOSE 55*  --  223*  --  292* 201*  BUN 9  --  9  --  6 <5*  CREATININE 1.85*  --  1.38* 1.41* 0.90 0.74  CALCIUM 10.1  --  9.6  --  8.9 9.3  MG  --  1.8  --   --  1.6* 2.0   Liver Function Tests:  Recent Labs Lab 02/17/16 2256 02/19/16 0643 02/20/16 0821  AST 42* 98* 99*  ALT 40 62 74*  ALKPHOS 241* 194* 235*  BILITOT 0.6 0.8 0.8  PROT 8.1 6.1* 6.7  ALBUMIN 4.2 3.0* 3.5   No results for input(s): LIPASE, AMYLASE in  the last 168 hours. No results for input(s): AMMONIA in the last 168 hours. CBC:  Recent Labs Lab 02/17/16 2256 02/18/16 0244 02/19/16 0643 02/20/16 0821  WBC 10.8* 9.3 5.3 4.6  NEUTROABS 7.2  --   --   --   HGB 15.2 12.9* 12.3* 13.3  HCT 42.1 36.0* 34.6* 37.1*  MCV 91.7 92.3 91.3 91.2  PLT 162 130* 102* 114*   Cardiac Enzymes:  Recent Labs Lab 02/18/16 0230 02/18/16 0612  TROPONINI <0.03 <0.03   BNP: Invalid input(s): POCBNP CBG:  Recent Labs Lab 02/19/16 2126 02/20/16 0502 02/20/16 0749 02/20/16 1302 02/20/16 1604  GLUCAP 298* 161* 187* 259* 214*    Recent Results (from the past 240 hour(s))  Blood culture (routine x 2)     Status: None (Preliminary result)   Collection Time: 02/18/16  2:33 AM  Result  Value Ref Range Status   Specimen Description BLOOD RIGHT ANTECUBITAL  Final   Special Requests IN PEDIATRIC BOTTLE 4CC  Final   Culture NO GROWTH 2 DAYS  Final   Report Status PENDING  Incomplete  Blood culture (routine x 2)     Status: None (Preliminary result)   Collection Time: 02/18/16  2:36 AM  Result Value Ref Range Status   Specimen Description BLOOD LEFT ANTECUBITAL  Final   Special Requests IN PEDIATRIC BOTTLE 4CC  Final   Culture NO GROWTH 2 DAYS  Final   Report Status PENDING  Incomplete     Scheduled Meds: . DULoxetine  30 mg Oral Daily  . enoxaparin (LOVENOX) injection  1 mg/kg Subcutaneous Q12H  . gabapentin  200 mg Oral TID  . insulin aspart  0-5 Units Subcutaneous QHS  . insulin aspart  0-9 Units Subcutaneous TID WC  . insulin glargine  16 Units Subcutaneous QHS  . sodium chloride flush  3 mL Intravenous Q12H  . warfarin  10 mg Oral ONCE-1800  . Warfarin - Pharmacist Dosing Inpatient   Does not apply q1800   Continuous Infusions: . sodium chloride 125 mL/hr at 02/20/16 1059     Demaya Hardge, MD, FACP, FHM. [facility] Pager [phone]  If 7PM-7AM, please contact night-coverage [email or web address] Password [facility] 02/20/2016, 5:49 PM   LOS: 2 days

## 2016-02-20 NOTE — Progress Notes (Signed)
NP Schorr notified of patient's b/p this morning. Awaiting any further orders

## 2016-02-20 NOTE — Progress Notes (Signed)
Patient is off the floor at this time- unable to recheck b/p. Will pass on to day RN.

## 2016-02-20 NOTE — Progress Notes (Signed)
ANTICOAGULATION CONSULT NOTE  Pharmacy Consult for Coumadin Indication: History of DVT  Allergies  Allergen Reactions  . Penicillins Hives  . Bacitracin Itching, Swelling and Rash  . Septra [Sulfamethoxazole-Trimethoprim] Itching, Swelling and Rash    Patient Measurements: Height: 6' (182.9 cm) Weight: 176 lb 6.4 oz (80.015 kg) (om the scale. pt aware of weight change from 2/21) IBW/kg (Calculated) : 77.6  Vital Signs: Temp: 98.4 F (36.9 C) (02/22 1031) Temp Source: Oral (02/22 1031) BP: 152/90 mmHg (02/22 1031) Pulse Rate: 68 (02/22 1031)  Labs:  Recent Labs  02/18/16 0230 02/18/16 0244 02/18/16 0612 02/19/16 0643 02/20/16 0821  HGB  --  12.9*  --  12.3* 13.3  HCT  --  36.0*  --  34.6* 37.1*  PLT  --  130*  --  102* 114*  LABPROT  --  14.9  --   --  14.3  INR  --  1.15  --   --  1.09  CREATININE  --  1.38* 1.41* 0.90 0.74  TROPONINI <0.03  --  <0.03  --   --     Estimated Creatinine Clearance: 115.9 mL/min (by C-G formula based on Cr of 0.74).   Assessment: 55yo male with hx DVT in 2010 and 2013; he has been off Coumadin for 8months. D-Dimer is elevated at 0.62, no VQ scan or dopplers completed yet. Resumed Coumadin per pharmacy + Lovenox bridge per MD.   INR 1.09, Hgb 13.3, plts 114- relatively stable, no bleeding noted.  SCr 0.74, CrCl >188mL/min  Goal of Therapy:  INR 2-3 Monitor platelets by anticoagulation protocol: Yes   Plan:  Coumadin  po x 1 tonight  Lovenox  subQ q12h per MD- dose appropriate Daily INR and CBC Watch for s/s bleeding Will provide education if warfarin is to be continued   Maylon Sailors D. Hansini Clodfelter, PharmD, BCPS Clinical Pharmacist Pager: [phone] 02/20/2016 11:31 AM

## 2016-02-21 DIAGNOSIS — E119 Type 2 diabetes mellitus without complications: Secondary | ICD-10-CM

## 2016-02-21 DIAGNOSIS — M549 Dorsalgia, unspecified: Secondary | ICD-10-CM | POA: Insufficient documentation

## 2016-02-21 DIAGNOSIS — Y838 Other surgical procedures as the cause of abnormal reaction of the patient, or of later complication, without mention of misadventure at the time of the procedure: Secondary | ICD-10-CM

## 2016-02-21 DIAGNOSIS — T82198A Other mechanical complication of other cardiac electronic device, initial encounter: Secondary | ICD-10-CM

## 2016-02-21 DIAGNOSIS — K0889 Other specified disorders of teeth and supporting structures: Secondary | ICD-10-CM

## 2016-02-21 DIAGNOSIS — T827XXA Infection and inflammatory reaction due to other cardiac and vascular devices, implants and grafts, initial encounter: Secondary | ICD-10-CM

## 2016-02-21 DIAGNOSIS — I330 Acute and subacute infective endocarditis: Secondary | ICD-10-CM

## 2016-02-21 DIAGNOSIS — R7401 Elevation of levels of liver transaminase levels: Secondary | ICD-10-CM | POA: Insufficient documentation

## 2016-02-21 DIAGNOSIS — R740 Nonspecific elevation of levels of transaminase and lactic acid dehydrogenase [LDH]: Secondary | ICD-10-CM

## 2016-02-21 DIAGNOSIS — B9689 Other specified bacterial agents as the cause of diseases classified elsewhere: Secondary | ICD-10-CM

## 2016-02-21 DIAGNOSIS — F1021 Alcohol dependence, in remission: Secondary | ICD-10-CM

## 2016-02-21 LAB — COMPREHENSIVE METABOLIC PANEL
ALK PHOS: 222 U/L — AB (ref 38–126)
ALT: 71 U/L — AB (ref 17–63)
AST: 83 U/L — AB (ref 15–41)
Albumin: 3.3 g/dL — ABNORMAL LOW (ref 3.5–5.0)
Anion gap: 8 (ref 5–15)
BUN: 7 mg/dL (ref 6–20)
CALCIUM: 9.3 mg/dL (ref 8.9–10.3)
CHLORIDE: 102 mmol/L (ref 101–111)
CO2: 25 mmol/L (ref 22–32)
CREATININE: 0.83 mg/dL (ref 0.61–1.24)
GFR calc non Af Amer: >60 mL/min (ref >60)
Glucose, Bld: 151 mg/dL — ABNORMAL HIGH (ref 65–99)
Potassium: 3.7 mmol/L (ref 3.5–5.1)
SODIUM: 135 mmol/L (ref 135–145)
Total Bilirubin: 0.5 mg/dL (ref 0.3–1.2)
Total Protein: 6.3 g/dL — ABNORMAL LOW (ref 6.5–8.1)

## 2016-02-21 LAB — GLUCOSE, CAPILLARY
Glucose-Capillary: 186 mg/dL — ABNORMAL HIGH (ref 65–99)
Glucose-Capillary: 329 mg/dL — ABNORMAL HIGH (ref 65–99)
Glucose-Capillary: 165 mg/dL — ABNORMAL HIGH (ref 65–99)
Glucose-Capillary: 196 mg/dL — ABNORMAL HIGH (ref 65–99)

## 2016-02-21 LAB — PROTIME-INR
INR: 1.25 (ref 0.00–1.49)
Prothrombin Time: 15.8 seconds — ABNORMAL HIGH (ref 11.6–15.2)

## 2016-02-21 LAB — CK: Total CK: 77 U/L (ref 49–397)

## 2016-02-21 LAB — CBC
HCT: 36.6 % — ABNORMAL LOW (ref 39.0–52.0)
HEMOGLOBIN: 13.3 g/dL (ref 13.0–17.0)
MCH: 33.7 pg (ref 26.0–34.0)
MCHC: 36.3 g/dL — ABNORMAL HIGH (ref 30.0–36.0)
MCV: 92.7 fL (ref 78.0–100.0)
PLATELETS: 113 10*3/uL — AB (ref 150–400)
RBC: 3.95 MIL/uL — AB (ref 4.22–5.81)
RDW: 13 % (ref 11.5–15.5)
WBC: 4.2 10*3/uL (ref 4.0–10.5)

## 2016-02-21 MED ORDER — WARFARIN SODIUM 10 MG PO TABS
10.0000 mg | ORAL_TABLET | Freq: Once | ORAL | Status: AC
  Administered 2016-02-21: 10 mg via ORAL
  Filled 2016-02-21: qty 1

## 2016-02-21 MED ORDER — INSULIN ASPART 100 UNIT/ML ~~LOC~~ SOLN
4.0000 [IU] | Freq: Three times a day (TID) | SUBCUTANEOUS | Status: DC
  Administered 2016-02-22: 4 [IU] via SUBCUTANEOUS

## 2016-02-21 NOTE — Progress Notes (Addendum)
ANTICOAGULATION CONSULT NOTE  Pharmacy Consult for Coumadin Indication: History of DVT  Allergies  Allergen Reactions  . Penicillins Hives  . Bacitracin Itching, Swelling and Rash  . Septra [Sulfamethoxazole-Trimethoprim] Itching, Swelling and Rash    Patient Measurements: Height: 6' (182.9 cm) Weight: 176 lb 12.9 oz (80.2 kg) IBW/kg (Calculated) : 77.6  Vital Signs: Temp: 97.9 F (36.6 C) (02/23 0757) Temp Source: Oral (02/23 0757) BP: 125/87 mmHg (02/23 0757) Pulse Rate: 61 (02/23 0757)  Labs:  Recent Labs  02/19/16 0643 02/20/16 0821 02/21/16 0629  HGB 12.3* 13.3 13.3  HCT 34.6* 37.1* 36.6*  PLT 102* 114* 113*  LABPROT  --  14.3 15.8*  INR  --  1.09 1.25  CREATININE 0.90 0.74 0.83  CKTOTAL  --   --  77    Estimated Creatinine Clearance: 111.7 mL/min (by C-G formula based on Cr of 0.83).   Assessment: 55yo male with hx DVT in 2010 and 2013; he has been off Coumadin for 8 months.  Per patient, he was not told to stop taking the medication by anyone. Per past notes and patient's report - he is to be on life-long anticoagulation.  D-Dimer is elevated at 0.62, no VQ scan or dopplers completed yet. Patient awaiting TEE as there was something possibly seen on pacer wires.  Resumed Coumadin per pharmacy + Lovenox bridge per MD.   INR 1.25, Hgb 13.3, plts 113- relatively stable, no bleeding noted.  SCr 0.83, CrCl >141mL/min  Goal of Therapy:  INR 2-3 Monitor platelets by anticoagulation protocol: Yes   Plan:  -Coumadin  po x 1 tonight  -Lovenox  subQ q12h per MD- dose appropriate. To continue until we determine what vegetation is -Daily INR and CBC -Watch for s/s bleeding -CM consult for possible NOAC. Pharmacy will provide education on whatever agent is chosen  Laylana Gerwig D. Sukhman Kocher, PharmD, BCPS Clinical Pharmacist Pager: [phone] 02/21/2016 10:37 AM

## 2016-02-21 NOTE — Progress Notes (Signed)
PROGRESS NOTE  Patrick Small RUE:[id number] DOB: 07-27-1961 DOA: 02/17/2016 PCP: Lora Paula, MD  Brief History 55 year old male with a history of insulin-dependent diabetes mellitus, sick sinus syndrome status post PPM, alcohol dependence presented with generalized pain all over and syncope. The patient has been self-medicating himself with alcohol for chronic back pain. He drinks at least 2 x 40 oz beers daily with occasional liquor. He quit drinking about one week prior to this admission. On the day of admission, he woke up with generalized body aches and was going to the bathroom when he had a syncopal episode. He states that he has not been taking his insulin faithfully. He checked his sugars earlier on the day of admission and noted it to be >600. He gave himself 40 units of insulin although he cannot tell me the type of insulin (MAR lists Lantus). In the ED he was actually hypoglycemic with symptoms because of 55. The patient was noted to have acute kidney injury with a serum creatinine 1.5 and lactic acid 3.5. The patient was started on fluid resuscitation. He also had relative hypotension with systolic blood pressure of 95 in the emergency department. Please note pt has essentially not taken any medications for over 8 months.  Assessment/Plan: Syncope  -likely secondary to volume depletion - patient has been having polydipsia and polyuria for the past week  -Patient likely hadHONK at home -However in a patient with sick sinus syndrome and permanent pacemaker, cannot rule out cardiac dysrhythmia--consulted cardiology. Cardiology input 2/21 appreciated: Normal device function. Cardiology will arrange outpatient follow-up. -orthostatics--neg -echo-- 02/19/16: LVEF 55-60 percent. 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 Please see discussion below. -UDS positive for THC -EKG paced, nonspecific T wave changes ? 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 Blood cultures 2 from admission: negative to date. Cardiology to follow up - TEE scheduled for 2/24. Hold off antibiotics for now in the absence of fevers or leukocytosis.  - Infectious disease consulted 2/23. Lactic acidosis  -Secondary to volume depletion  - Resolved after IV fluids. -follow blood cultures--neg to date -CXR neg for infiltrates Diabetes mellitus type 2 with polyneuropathy -Hemoglobin A1c--12.1  -Increased Lantus 15 units . Uncontrolled. Adjust insulin's as needed. Diabetes coordinator input appreciated. We will add mealtime NovoLog 5 units and bedtime sliding scale NovoLog. -NovoLog sliding scale--change to moderate scale -Start gabapentin -consult dietician Acute kidney injury  -Secondary to volume depletion  - resolved. History of DVT - this occurred in [place], therefore medical records are not available  -According to the patient's prior PCP the patient had a DVT in 2010, and December 2013  -Check d-dimer to risk stratify his risk for recurrent thromboembolism -D-dimer positive.   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 He stated that he took Coumadin up to 3 weeks ago and was followed up [facility] but then he stopped taking it for no specific reason.  - Consider switching to NOAC's  Transaminasemia - hepatitis B surface antigen: Negative  -Hepatitis C antibody <0.1 -HIV--neg -RUQ ultrasound: Report as below. No acute issues. - Unclear etiology. CK normal. Patient has had transient mild abnormal transaminases chronically dating back to 2011. Depression -appreciate pyschiatry -started cymbalta Hyponatremia -secondary to volume depletion - Resolved.  Hypokalemia/hypomagnesemia  -repleted -check mag-- 1.6  Chronic back pain -X-ray thoracic  and lumbar spine--negative for fractures or  dislocations  History of HTN - Fluctuating and mildly uncontrolled -monitor Hx Hypothyroidism -TSH 0.443 -recheck TSH in one month Thrombocytopenia - Stable.  Dental caries  - ? Source of endocarditis if confirmed.     DVT prophylaxis: On Lovenox bridge and Coumadin  CODE STATUS: Full  Family Communication: None at bedside  Disposition Plan: DC home when medically stable.   Consultations  - Cardiology  - Infectious disease  Procedures/Studies: Dg Chest 2 View  02/18/2016  CLINICAL DATA:  Syncope with tachypnea. EXAM: CHEST  2 VIEW COMPARISON:  09/18/2014 FINDINGS: Dual lead left-sided pacemaker remains in place.The cardiomediastinal contours are normal. The lungs are clear. Pulmonary vasculature is normal. No consolidation, pleural effusion, or pneumothorax. No acute osseous abnormalities are seen. IMPRESSION: No acute process. Electronically Signed   By: Rubye Oaks M.D.   On: 02/18/2016 00:03   Dg Cervical Spine Complete  02/18/2016  CLINICAL DATA:  Syncope with neck pain tonight. EXAM: CERVICAL SPINE - COMPLETE 4+ VIEW COMPARISON:  None. FINDINGS: Cervical spine alignment is maintained. Vertebral body heights and intervertebral disc spaces are preserved. The dens is intact. Posterior elements appear well-aligned. Scattered facet arthropathy. There is no evidence of fracture. No prevertebral soft tissue edema. IMPRESSION: Minimal degenerative change without acute abnormality of the cervical spine. Electronically Signed   By: Rubye Oaks M.D.   On: 02/18/2016 00:04   Dg Thoracic Spine 2 View  02/18/2016  CLINICAL DATA:  Chronic back pain. EXAM: LUMBAR SPINE - 2-3 VIEW; THORACIC SPINE 2 VIEWS COMPARISON:  Chest x-ray 02/17/2016 FINDINGS: Thoracic spine: Normal alignment of the thoracic vertebral bodies. Mild compression deformities of T8, T7 and T5 appear stable. Stable degenerative changes. No destructive bony changes or abnormal paraspinal soft tissue swelling. Pacer  wires are noted. Lumbar spine: Normal alignment of the lumbar vertebral bodies. No acute fracture or destructive bony change. Mild facet degenerative changes at L4-5 and L5-S1 but no definite pars defects. The visualized bony pelvis is intact and the SI joints appear normal. IMPRESSION: 1. Degenerative changes and probable remote mild thoracic compression deformities. 2. Normal alignment and no acute bony findings in the lumbar spine. Electronically Signed   By: Rudie Meyer M.D.   On: 02/18/2016 21:35   Dg Lumbar Spine 2-3 Views  02/18/2016  CLINICAL DATA:  Chronic back pain. EXAM: LUMBAR SPINE - 2-3 VIEW; THORACIC SPINE 2 VIEWS COMPARISON:  Chest x-ray 02/17/2016 FINDINGS: Thoracic spine: Normal alignment of the thoracic vertebral bodies. Mild compression deformities of T8, T7 and T5 appear stable. Stable degenerative changes. No destructive bony changes or abnormal paraspinal soft tissue swelling. Pacer wires are noted. Lumbar spine: Normal alignment of the lumbar vertebral bodies. No acute fracture or destructive bony change. Mild facet degenerative changes at L4-5 and L5-S1 but no definite pars defects. The visualized bony pelvis is intact and the SI joints appear normal. IMPRESSION: 1. Degenerative changes and probable remote mild thoracic compression deformities. 2. Normal alignment and no acute bony findings in the lumbar spine. Electronically Signed   By: Rudie Meyer M.D.   On: 02/18/2016 21:35   Ct Head Wo Contrast  02/18/2016  CLINICAL DATA:  Acute onset of unwitnessed syncope and collapse at home. Headache. Hit left side of forehead. Initial encounter. EXAM: CT HEAD WITHOUT CONTRAST TECHNIQUE: Contiguous axial images were obtained from the base of the skull through the vertex without intravenous contrast. COMPARISON:  None. FINDINGS: There is no evidence of acute infarction, mass lesion,  or intra- or extra-axial hemorrhage on CT. Scattered periventricular and subcortical white matter change  likely reflects small vessel ischemic microangiopathy. The posterior fossa, including the cerebellum, brainstem and fourth ventricle, is within normal limits. The third and lateral ventricles, and basal ganglia are unremarkable in appearance. The cerebral hemispheres are symmetric in appearance, with normal gray-white differentiation. No mass effect or midline shift is seen. There is no evidence of fracture; visualized osseous structures are unremarkable in appearance. The visualized portions of the orbits are within normal limits. The paranasal sinuses and mastoid air cells are well-aerated. No significant soft tissue abnormalities are seen. IMPRESSION: 1. No acute intracranial pathology seen on CT. 2. Scattered small vessel ischemic microangiopathy. Electronically Signed   By: Roanna Raider M.D.   On: 02/18/2016 00:10   US Abdomen Limited Ruq  02/20/2016  CLINICAL DATA:  Increased transaminases EXAM: US ABDOMEN LIMITED - RIGHT UPPER QUADRANT COMPARISON:  02/04/2010 FINDINGS: Gallbladder: No shadowing gallstones are noted within gallbladder. There is 5.6 mm gallbladder wall polyp. No thickening of gallbladder wall. No sonographic Murphy's sign. Common bile duct: Diameter: 3 mm in diameter within normal limits. Liver: No focal lesion identified. Within normal limits in parenchymal echogenicity. IMPRESSION: No shadowing gallstones are noted within gallbladder. There is 5.6 mm gallbladder wall polyp. No thickening of gallbladder wall. No sonographic Murphy's sign. Normal CBD. Electronically Signed   By: Natasha Mead M.D.   On: 02/20/2016 08:04        Subjective:  denies complaints. No history of fever, chills, night sweats or weight loss.   Objective: Filed Vitals:   02/20/16 2035 02/21/16 0757 02/21/16 1202 02/21/16 1630  BP: 172/107 125/87 131/78 126/86  Pulse: 62 61 62 60  Temp: 98.7 F (37.1 C) 97.9 F (36.6 C) 98.5 F (36.9 C) 98.8 F (37.1 C)  TempSrc: Oral Oral Oral Oral  Resp: 18 17 18  17   Height:      Weight: 80.2 kg (176 lb 12.9 oz)     SpO2: 100% 100% 100% 99%    Intake/Output Summary (Last 24 hours) at 02/21/16 1717 Last data filed at 02/21/16 1400  Gross per 24 hour  Intake    780 ml  Output      0 ml  Net    780 ml   Weight change: -4.7 kg (-10 lb 5.8 oz) Exam:   General:  Pt is alert, follows commands appropriately, not in acute distress. Patient sitting up comfortably in chair. Does not look septic or toxic.   HEENT: No icterus, No thrush, No neck mass, [place]/AT. Multiple missing teeth. Remainder of teeth with extensive caries. Especially a right upper? Premolar   Cardiovascular: RRR, S1/S2, no rubs, no gallops. Telemetry: A paced rhythm/sinus rhythm .   Respiratory: CTA bilaterally, no wheezing, no crackles, no rhonchi  Abdomen: Soft/+BS, non tender, non distended, no guarding; no hepatosplenomegaly   Extremities: No edema, No lymphangitis, No petechiae, No rashes, no synovitis; clubbing without cyanosis   CNS: Alert and oriented. No focal deficits.  Data Reviewed: Basic Metabolic Panel:  Recent Labs Lab 02/17/16 2256 02/18/16 0230 02/18/16 0244 02/18/16 0612 02/19/16 1610 02/20/16 0821 02/21/16 0629  NA 134*  --  139  --  132* 137 135  K 2.9*  --  3.9  --  3.4* 3.8 3.7  CL 94*  --  101  --  101 102 102  CO2 26  --  27  --  24 24 25   GLUCOSE 55*  --  223*  --  292* 201* 151*  BUN 9  --  9  --  6 <5* 7  CREATININE 1.85*  --  1.38* 1.41* 0.90 0.74 0.83  CALCIUM 10.1  --  9.6  --  8.9 9.3 9.3  MG  --  1.8  --   --  1.6* 2.0  --    Liver Function Tests:  Recent Labs Lab 02/17/16 2256 02/19/16 0643 02/20/16 0821 02/21/16 0629  AST 42* 98* 99* 83*  ALT 40 62 74* 71*  ALKPHOS 241* 194* 235* 222*  BILITOT 0.6 0.8 0.8 0.5  PROT 8.1 6.1* 6.7 6.3*  ALBUMIN 4.2 3.0* 3.5 3.3*   No results for input(s): LIPASE, AMYLASE in the last 168 hours. No results for input(s): AMMONIA in the last 168 hours. CBC:  Recent Labs Lab 02/17/16 2256  02/18/16 0244 02/19/16 0643 02/20/16 0821 02/21/16 0629  WBC 10.8* 9.3 5.3 4.6 4.2  NEUTROABS 7.2  --   --   --   --   HGB 15.2 12.9* 12.3* 13.3 13.3  HCT 42.1 36.0* 34.6* 37.1* 36.6*  MCV 91.7 92.3 91.3 91.2 92.7  PLT 162 130* 102* 114* 113*   Cardiac Enzymes:  Recent Labs Lab 02/18/16 0230 02/18/16 0612 02/21/16 0629  CKTOTAL  --   --  77  TROPONINI <0.03 <0.03  --    BNP: Invalid input(s): POCBNP CBG:  Recent Labs Lab 02/20/16 1302 02/20/16 1604 02/20/16 2028 02/21/16 0756 02/21/16 1200  GLUCAP 259* 214* 200* 186* 329*    Recent Results (from the past 240 hour(s))  Blood culture (routine x 2)     Status: None (Preliminary result)   Collection Time: 02/18/16  2:33 AM  Result Value Ref Range Status   Specimen Description BLOOD RIGHT ANTECUBITAL  Final   Special Requests IN PEDIATRIC BOTTLE 4CC  Final   Culture NO GROWTH 3 DAYS  Final   Report Status PENDING  Incomplete  Blood culture (routine x 2)     Status: None (Preliminary result)   Collection Time: 02/18/16  2:36 AM  Result Value Ref Range Status   Specimen Description BLOOD LEFT ANTECUBITAL  Final   Special Requests IN PEDIATRIC BOTTLE 4CC  Final   Culture NO GROWTH 3 DAYS  Final   Report Status PENDING  Incomplete     Scheduled Meds: . DULoxetine  30 mg Oral Daily  . enoxaparin (LOVENOX) injection  1 mg/kg Subcutaneous Q12H  . gabapentin  200 mg Oral TID  . insulin aspart  0-5 Units Subcutaneous QHS  . insulin aspart  0-9 Units Subcutaneous TID WC  . insulin glargine  20 Units Subcutaneous QHS  . sodium chloride flush  3 mL Intravenous Q12H  . warfarin  10 mg Oral ONCE-1800  . Warfarin - Pharmacist Dosing Inpatient   Does not apply q1800   Continuous Infusions:     HONGALGI,ANAND, MD, FACP, FHM. [facility] Pager [phone]  If 7PM-7AM, please contact night-coverage [email or web address] Password [facility] 02/21/2016, 5:17 PM   LOS: 3 days

## 2016-02-21 NOTE — Consult Note (Signed)
Date of Admission:  02/17/2016  Date of Consult:  02/21/2016  Reason for Consult: possible pacemaker infection Referring Physician: Dr. Algis Liming   HPI: Patrick Small is an 55 y.o. male with hx of sick -sinus syndrome sp PM placement. He also has comorbid medical conditions of alcohol abuse, HTN, DM and poor dentition. He was admitted on 2/201/7 with severe back pain and syncopal episode.   One week prior to admission he quit drinking 2 40 oz 8% etoh beers per day cold [place].   He did not report withdrawals (has never had them he thinks), but has been feeling "weak" all week until the day of presentation when he woke up feeling pain "all over my body from my head to my feet, with cramps and terrible muscle spasms". Later in the morning, he was watching TV, felt "woozy" and got up to go to the bathroom and blacked out and fell on the ground. When he woke up, he was groggy but had not urinated on himself or moved bowels and his sister called 9-1-1.  In the ED, he was hypotensive to 100/75, responsive to fluids. No fever. Na 134, K 2.9, blood glucose 55, creatinine 1.85 (from a baseline of 0.81 year ago), AST 42, WBC 10.8, Hgb 15.2, creatinine up,  EtOH negative, lactate 3.5. LF Chest x-ray was clear. An ECG showed new lateral T-wave inversions but troponin was negative. K was started, fluids were administered, a CT head was normal. Blood cultures were obtained at that time due to concern there could be occult infection but they have been NGTD.  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  He has had a 2nd set of blood cultures drawn today.  He otherwise feels much better.  He can recall roughly 8 months ago having suffered dental pain with swelling of his right side of his face that has since subsided.    Past Medical History  Diagnosis Date  . Acid reflux   . Acne rosacea   .  Plantar fasciitis   . Thyroid disease   . Mood disorder ([place])   . Diabetes mellitus without complication ([place]) Dx 5427  . Hyperlipidemia Dx 2010  . Hypertension Dx 2010  . Substance abuse     last alchohol intake 01/24/2013  . DVT (deep venous thrombosis) ([place]) 2012 and 2013     Left and Right leg, no history of PE      Past Surgical History  Procedure Laterality Date  . Pacemaker insertion  2014    Social History:  reports that he has been smoking Cigarettes.  He has been smoking about 6.00 packs per day. He has never used smokeless tobacco. His alcohol and drug histories are not on file.   Family History  Problem Relation Age of Onset  . Diabetes Mother   . Diabetes Father   . Diabetes Sister     Allergies  Allergen Reactions  . Penicillins Hives  . Bacitracin Itching, Swelling and Rash  . Septra [Sulfamethoxazole-Trimethoprim] Itching, Swelling and Rash     Medications: I have reviewed patients current medications as documented in Epic Anti-infectives    None         ROS:  as in HPI otherwise remainder of 12 point Review of Systems is negative   Blood pressure 126/86, pulse 60, temperature 98.8 F (37.1 C), temperature source Oral, resp. rate 17, height 6' (  1.829 m), weight 176 lb 12.9 oz (80.2 kg), SpO2 99 %. General: Alert and awake, oriented x3, not in any acute distress. HEENT: anicteric sclera,  EOMI, oropharynx clear and without exudate he has poor dentition and caries Cardiovascular: regular rate, normal r,  no murmur rubs or gallops, PM pocket is clean Pulmonary: clear to auscultation bilaterally, no wheezing, rales or rhonchi Gastrointestinal: soft nontender, nondistended, normal bowel sounds, Musculoskeletal: no  clubbing or edema noted bilaterally Skin, soft tissue: no rashes Neuro: nonfocal, strength and sensation intact   Results for orders placed or performed during the hospital encounter of 02/17/16 (from the past 48 hour(s))  Glucose,  capillary     Status: Abnormal   Collection Time: 02/19/16  9:26 PM  Result Value Ref Range   Glucose-Capillary 298 (H) 65 - 99 mg/dL   Comment 1 Notify RN   Glucose, capillary     Status: Abnormal   Collection Time: 02/20/16  5:02 AM  Result Value Ref Range   Glucose-Capillary 161 (H) 65 - 99 mg/dL   Comment 1 Notify RN    Comment 2 Document in Chart   Glucose, capillary     Status: Abnormal   Collection Time: 02/20/16  7:49 AM  Result Value Ref Range   Glucose-Capillary 187 (H) 65 - 99 mg/dL  Magnesium     Status: None   Collection Time: 02/20/16  8:21 AM  Result Value Ref Range   Magnesium 2.0 1.7 - 2.4 mg/dL  Comprehensive metabolic panel     Status: Abnormal   Collection Time: 02/20/16  8:21 AM  Result Value Ref Range   Sodium 137 135 - 145 mmol/L   Potassium 3.8 3.5 - 5.1 mmol/L   Chloride 102 101 - 111 mmol/L   CO2 24 22 - 32 mmol/L   Glucose, Bld 201 (H) 65 - 99 mg/dL   BUN <5 (L) 6 - 20 mg/dL   Creatinine, Ser 0.74 0.61 - 1.24 mg/dL   Calcium 9.3 8.9 - 10.3 mg/dL   Total Protein 6.7 6.5 - 8.1 g/dL   Albumin 3.5 3.5 - 5.0 g/dL   AST 99 (H) 15 - 41 U/L   ALT 74 (H) 17 - 63 U/L   Alkaline Phosphatase 235 (H) 38 - 126 U/L   Total Bilirubin 0.8 0.3 - 1.2 mg/dL   GFR calc non Af Amer >60 >60 mL/min   GFR calc Af Amer >60 >60 mL/min    Comment: (NOTE) The eGFR has been calculated using the CKD EPI equation. This calculation has not been validated in all clinical situations. eGFR's persistently <60 mL/min signify possible Chronic Kidney Disease.    Anion gap 11 5 - 15  Protime-INR     Status: None   Collection Time: 02/20/16  8:21 AM  Result Value Ref Range   Prothrombin Time 14.3 11.6 - 15.2 seconds   INR 1.09 0.00 - 1.49  CBC     Status: Abnormal   Collection Time: 02/20/16  8:21 AM  Result Value Ref Range   WBC 4.6 4.0 - 10.5 K/uL   RBC 4.07 (L) 4.22 - 5.81 MIL/uL   Hemoglobin 13.3 13.0 - 17.0 g/dL   HCT 37.1 (L) 39.0 - 52.0 %   MCV 91.2 78.0 - 100.0 fL    MCH 32.7 26.0 - 34.0 pg   MCHC 35.8 30.0 - 36.0 g/dL   RDW 12.7 11.5 - 15.5 %   Platelets 114 (L) 150 - 400 K/uL    Comment: CONSISTENT  WITH PREVIOUS RESULT  Glucose, capillary     Status: Abnormal   Collection Time: 02/20/16  1:02 PM  Result Value Ref Range   Glucose-Capillary 259 (H) 65 - 99 mg/dL  Glucose, capillary     Status: Abnormal   Collection Time: 02/20/16  4:04 PM  Result Value Ref Range   Glucose-Capillary 214 (H) 65 - 99 mg/dL  Glucose, capillary     Status: Abnormal   Collection Time: 02/20/16  8:28 PM  Result Value Ref Range   Glucose-Capillary 200 (H) 65 - 99 mg/dL  Protime-INR     Status: Abnormal   Collection Time: 02/21/16  6:29 AM  Result Value Ref Range   Prothrombin Time 15.8 (H) 11.6 - 15.2 seconds   INR 1.25 0.00 - 1.49  CBC     Status: Abnormal   Collection Time: 02/21/16  6:29 AM  Result Value Ref Range   WBC 4.2 4.0 - 10.5 K/uL   RBC 3.95 (L) 4.22 - 5.81 MIL/uL   Hemoglobin 13.3 13.0 - 17.0 g/dL   HCT 36.6 (L) 39.0 - 52.0 %   MCV 92.7 78.0 - 100.0 fL   MCH 33.7 26.0 - 34.0 pg   MCHC 36.3 (H) 30.0 - 36.0 g/dL   RDW 13.0 11.5 - 15.5 %   Platelets 113 (L) 150 - 400 K/uL    Comment: CONSISTENT WITH PREVIOUS RESULT  Comprehensive metabolic panel     Status: Abnormal   Collection Time: 02/21/16  6:29 AM  Result Value Ref Range   Sodium 135 135 - 145 mmol/L   Potassium 3.7 3.5 - 5.1 mmol/L   Chloride 102 101 - 111 mmol/L   CO2 25 22 - 32 mmol/L   Glucose, Bld 151 (H) 65 - 99 mg/dL   BUN 7 6 - 20 mg/dL   Creatinine, Ser 0.83 0.61 - 1.24 mg/dL   Calcium 9.3 8.9 - 10.3 mg/dL   Total Protein 6.3 (L) 6.5 - 8.1 g/dL   Albumin 3.3 (L) 3.5 - 5.0 g/dL   AST 83 (H) 15 - 41 U/L   ALT 71 (H) 17 - 63 U/L   Alkaline Phosphatase 222 (H) 38 - 126 U/L   Total Bilirubin 0.5 0.3 - 1.2 mg/dL   GFR calc non Af Amer >60 >60 mL/min   GFR calc Af Amer >60 >60 mL/min    Comment: (NOTE) The eGFR has been calculated using the CKD EPI equation. This calculation has  not been validated in all clinical situations. eGFR's persistently <60 mL/min signify possible Chronic Kidney Disease.    Anion gap 8 5 - 15  CK     Status: None   Collection Time: 02/21/16  6:29 AM  Result Value Ref Range   Total CK 77 49 - 397 U/L  Glucose, capillary     Status: Abnormal   Collection Time: 02/21/16  7:56 AM  Result Value Ref Range   Glucose-Capillary 186 (H) 65 - 99 mg/dL  Glucose, capillary     Status: Abnormal   Collection Time: 02/21/16 12:00 PM  Result Value Ref Range   Glucose-Capillary 329 (H) 65 - 99 mg/dL  Glucose, capillary     Status: Abnormal   Collection Time: 02/21/16  4:28 PM  Result Value Ref Range   Glucose-Capillary 165 (H) 65 - 99 mg/dL      ) Recent Results (from the past 720 hour(s))  Blood culture (routine x 2)     Status: None (Preliminary result)   Collection  Time: 02/18/16  2:33 AM  Result Value Ref Range Status   Specimen Description BLOOD RIGHT ANTECUBITAL  Final   Special Requests IN PEDIATRIC BOTTLE 4CC  Final   Culture NO GROWTH 3 DAYS  Final   Report Status PENDING  Incomplete  Blood culture (routine x 2)     Status: None (Preliminary result)   Collection Time: 02/18/16  2:36 AM  Result Value Ref Range Status   Specimen Description BLOOD LEFT ANTECUBITAL  Final   Special Requests IN PEDIATRIC BOTTLE 4CC  Final   Culture NO GROWTH 3 DAYS  Final   Report Status PENDING  Incomplete     Impression/Recommendation  Principal Problem:   Depression Active Problems:   DM type 2 with diabetic peripheral neuropathy (HCC)   Hypertension   Hypothyroidism   Atrial fibrillation ([place])   AKI (acute kidney injury) ([place])   Hypokalemia   Hyponatremia   Syncope   Elevated lactic acid level   Dehydration   Sinus node dysfunction (HCC)   Daylen Haigler is a 55 y.o. male with  Hx of alcoholism, DM, DVT's sick syndrome SP PM admitted with syncope and found to have possible vegetation on PM lead  #1 Possible PM  infection:  --would get another set of blood cultures in the am --would NOT start any antibiotics at this point in time --agree with TEE to better visualize --obtain cultures with PM extraction if we still feel this is an infected lead  #2 Screening: check for HCV and make sure HIV was checked   02/21/2016, 7:35 PM   Thank you so much for this interesting consult  [facility] [place] [phone] (pager) [phone] (office) 02/21/2016, 7:35 PM  Rhina Br[place] 02/21/2016, 7:35 PM

## 2016-02-21 NOTE — Consult Note (Signed)
ELECTROPHYSIOLOGY CONSULT NOTE    Patient ID: Patrick Small MRN: [id number], DOB/AGE: August 21, 1961 55 y.o.  Admit date: 02/17/2016 Date of Consult: 02/21/2016  Primary Physician: Lora Paula, MD Primary Cardiologist: Elberta Fortis (new this admission)  Reason for Consultation: possible vegetation on pacemaker lead  HPI:  Patrick Small is a 55 y.o. male with a past medical history significant for diabetes, hypertension, alcohol abuse, recurrent DVT, and sick sinus syndrome s/p PPM implant. He presented to the hospital for syncope and generalized pain. Lab work was notable for AKI and he has been fluid resuscitated. Device interrogation during this admission demonstrated normal device function and no arrhythmias on the day of admission.  Echocardiogram demonstrated EF 55-60%, no RWMA, large mobile density seen on the atrial side of the tricuspid valve that appears to come from the RV pacing wire. TEE was recommended.  EP has been asked to evaluate for treatment options.   He currently denies chest pain, shortness of breath, nausea, vomiting, recent fevers or chills.   Past Medical History  Diagnosis Date  . Acid reflux   . Acne rosacea   . Plantar fasciitis   . Thyroid disease   . Mood disorder (HCC)   . Diabetes mellitus without complication (HCC) Dx 2010  . Hyperlipidemia Dx 2010  . Hypertension Dx 2010  . Substance abuse     last alchohol intake 01/24/2013  . DVT (deep venous thrombosis) (HCC) 2012 and 2013     Left and Right leg, no history of PE       Surgical History:  Past Surgical History  Procedure Laterality Date  . Pacemaker insertion  2014     Prescriptions prior to admission  Medication Sig Dispense Refill Last Dose  . Insulin Glargine (LANTUS SOLOSTAR) 100 UNIT/ML Solostar Pen Inject 25 Units into the skin daily. (Patient taking differently: Inject 20 Units into the skin daily. ) 30 mL 3 02/17/2016 at Unknown time    Inpatient Medications:  . DULoxetine   30 mg Oral Daily  . enoxaparin (LOVENOX) injection  1 mg/kg Subcutaneous Q12H  . gabapentin  200 mg Oral TID  . insulin aspart  0-5 Units Subcutaneous QHS  . insulin aspart  0-9 Units Subcutaneous TID WC  . insulin glargine  20 Units Subcutaneous QHS  . sodium chloride flush  3 mL Intravenous Q12H  . warfarin  10 mg Oral ONCE-1800  . Warfarin - Pharmacist Dosing Inpatient   Does not apply q1800    Allergies:  Allergies  Allergen Reactions  . Penicillins Hives  . Bacitracin Itching, Swelling and Rash  . Septra [Sulfamethoxazole-Trimethoprim] Itching, Swelling and Rash    Social History   Social History  . Marital Status: Single    Spouse Name: N/A  . Number of Children: N/A  . Years of Education: N/A   Occupational History  . Not on file.   Social History Main Topics  . Smoking status: Current Every Day Smoker -- 6.00 packs/day    Types: Cigarettes  . Smokeless tobacco: Never Used  . Alcohol Use: Not on file  . Drug Use: Not on file  . Sexual Activity: Not on file   Other Topics Concern  . Not on file   Social History Narrative   Moved back to [place] after living in [place], [place] 08/2014.    Moved to be closer to his aging mother.   Lived in [place] for 15 years.            Family History  Problem Relation Age of Onset  . Diabetes Mother   . Diabetes Father   . Diabetes Sister      Review of Systems: All other systems reviewed and are otherwise negative except as noted above.  Physical Exam: Filed Vitals:   02/20/16 1031 02/20/16 1737 02/20/16 2035 02/21/16 0757  BP: 152/90 147/109 172/107 125/87  Pulse: 68 59 62 61  Temp: 98.4 F (36.9 C) 98.7 F (37.1 C) 98.7 F (37.1 C) 97.9 F (36.6 C)  TempSrc: Oral Oral Oral Oral  Resp: Height:      Weight:   176 lb 12.9 oz (80.2 kg)   SpO2: 100% 100% 100% 100%    GEN- The patient is well appearing, alert and oriented x 3 today.   HEENT: normocephalic, atraumatic; sclera clear, conjunctiva  pink; hearing intact; oropharynx clear; neck supple  Lungs- Clear to ausculation bilaterally, normal work of breathing.  No wheezes, rales, rhonchi Heart- Regular rate and rhythm, no murmurs, rubs or gallops  GI- soft, non-tender, non-distended, bowel sounds present  Extremities- no clubbing, cyanosis, or edema; DP/PT/radial pulses 2+ bilaterally MS- no significant deformity or atrophy Skin- warm and dry, no rash or lesion Psych- euthymic mood, full affect Neuro- strength and sensation are intact  Labs:   Lab Results  Component Value Date   WBC 4.2 02/21/2016   HGB 13.3 02/21/2016   HCT 36.6* 02/21/2016   MCV 92.7 02/21/2016   PLT 113* 02/21/2016    Recent Labs Lab 02/21/16 0629  NA 135  K 3.7  CL 102  CO2 25  BUN 7  CREATININE 0.83  CALCIUM 9.3  PROT 6.3*  BILITOT 0.5  ALKPHOS 222*  ALT 71*  AST 83*  GLUCOSE 151*      Radiology/Studies: Dg Chest 2 View 02/18/2016  CLINICAL DATA:  Syncope with tachypnea. EXAM: CHEST  2 VIEW COMPARISON:  09/18/2014 FINDINGS: Dual lead left-sided pacemaker remains in place.The cardiomediastinal contours are normal. The lungs are clear. Pulmonary vasculature is normal. No consolidation, pleural effusion, or pneumothorax. No acute osseous abnormalities are seen. IMPRESSION: No acute process. Electronically Signed   By: Rubye Oaks M.D.   On: 02/18/2016 00:03   EKG: atrial pacing with intrinsic ventricular conduction at 60  TELEMETRY: atrial pacing with intrinsic ventricular conduction   DEVICE HISTORY: BSX dual chamber pacemaker implanted 2014 for sick sinus syndrome   Assessment/Plan: 1.  Possible vegetation on pacemaker lead TEE is indicated  Bradyn Vassey plan TEE for tomorrow morning at 8AM - risks, benefits discussed with the patient by Dr Elberta Fortis who wishes to proceed Sitlali Koerner obtain blood cultures and would recommend ID consult  2.  Sick sinus syndrome Pacemaker with normal device function this admission Crystelle Ferrufino enroll in our remote  monitoring and schedule outpatient followup The patient is not device dependent     3.  Recurrent DVT's Warfarin restarted Consider NOAC after work up done for possible vegetation  4. Tobacco abuse Cessation advised  We Jazier Mcglamery follow  Signed, Gypsy Balsam, NP 02/21/2016 10:48 AM   I have seen and examined this patient with Gypsy Balsam.  Agree with above, note added to reflect my findings.  On exam, regular rhythm, no murmurs, lungs clear.  Patient initially presented with syncope, found to have vegetation on pacemaker lead.  No infectious symptoms such as fever, chills, sweats.  Leads have been in for 2-3 years per the patient.  At this point, unsure if infectious.  Laporchia Nakajima plan on TEE to further  characterize the vegetation.  Tyrann Donaho M. Brendon Christoffel MD 02/21/2016 4:38 PM

## 2016-02-21 NOTE — Progress Notes (Signed)
Inpatient Diabetes Program Recommendations  AACE/ADA: New Consensus Statement on Inpatient Glycemic Control (2015)  Target Ranges:  Prepandial:   less than 140 mg/dL      Peak postprandial:   less than 180 mg/dL (1-2 hours)      Critically ill patients:  140 - 180 mg/dL  Results for Patrick Small, Patrick Small (MRN [id number]) as of 02/21/2016 15:10  Ref. Range 02/20/2016 05:02 02/20/2016 07:49 02/20/2016 13:02 02/20/2016 16:04 02/20/2016 20:28 02/21/2016 07:56 02/21/2016 12:00  Glucose-Capillary Latest Ref Range: 65-99 mg/dL 782 (H) 956 (H) 213 (H) 214 (H) 200 (H) 186 (H) 329 (H)   Review of Glycemic Control  Current orders for Inpatient glycemic control: Lantus 20 untis QHS, Novolog 0-9 units TID with meals  Inpatient Diabetes Program Recommendations: Insulin - Meal Coverage: Post prandial glucose is consistently elevated. Please consider ordering Novolog 5 units TID with meals for meal coverage (in addition to Novolog correction). Insulin-Correction: Please consider ordering Novolog bedtime correction scale.  Thanks, Orlando Penner, RN, MSN, CDE Diabetes Coordinator Inpatient Diabetes Program [phone] (Team Pager from 8am to 5pm) [phone] ([facility] office) [phone] [facility] office) [phone] [facility] office)

## 2016-02-21 NOTE — Clinical Social Work Note (Signed)
Patrick Small requested information regarding resources for exercising and physical therapy. Patient also requested resources for assistance with transportation after being discharged from the hospital. CSW intern spoke with Patrick Small and provided patient with available resources. These resources included a list of the community [facility] [place] and recreation centers and their contact information, also information regarding applying for [employer] Transportation was provided.  Patient also was provided an application for personal care services was provided to patient upon request.  There were no further questions or concerns at this time.

## 2016-02-22 ENCOUNTER — Inpatient Hospital Stay (HOSPITAL_COMMUNITY): Payer: Medicaid Other

## 2016-02-22 ENCOUNTER — Encounter (HOSPITAL_COMMUNITY)
Admission: EM | Disposition: A | Discharge status: Home / Self Care | Payer: Self-pay | Source: Home / Self Care | Attending: Internal Medicine

## 2016-02-22 ENCOUNTER — Encounter (HOSPITAL_COMMUNITY): Payer: Self-pay | Admitting: Cardiovascular Disease

## 2016-02-22 DIAGNOSIS — T829XXD Unspecified complication of cardiac and vascular prosthetic device, implant and graft, subsequent encounter: Secondary | ICD-10-CM

## 2016-02-22 DIAGNOSIS — E871 Hypo-osmolality and hyponatremia: Secondary | ICD-10-CM

## 2016-02-22 DIAGNOSIS — I4891 Unspecified atrial fibrillation: Secondary | ICD-10-CM

## 2016-02-22 DIAGNOSIS — E872 Acidosis: Secondary | ICD-10-CM

## 2016-02-22 DIAGNOSIS — E1142 Type 2 diabetes mellitus with diabetic polyneuropathy: Secondary | ICD-10-CM

## 2016-02-22 DIAGNOSIS — E876 Hypokalemia: Secondary | ICD-10-CM

## 2016-02-22 DIAGNOSIS — E860 Dehydration: Secondary | ICD-10-CM

## 2016-02-22 HISTORY — PX: TEE WITHOUT CARDIOVERSION: SHX5443

## 2016-02-22 LAB — CBC
HCT: 38.1 % — ABNORMAL LOW (ref 39.0–52.0)
Hemoglobin: 13.6 g/dL (ref 13.0–17.0)
MCH: 33.6 pg (ref 26.0–34.0)
MCHC: 35.7 g/dL (ref 30.0–36.0)
MCV: 94.1 fL (ref 78.0–100.0)
PLATELETS: 128 10*3/uL — AB (ref 150–400)
RBC: 4.05 MIL/uL — ABNORMAL LOW (ref 4.22–5.81)
RDW: 13.4 % (ref 11.5–15.5)
WBC: 4.5 10*3/uL (ref 4.0–10.5)

## 2016-02-22 LAB — GLUCOSE, CAPILLARY
GLUCOSE-CAPILLARY: 169 mg/dL — AB (ref 65–99)
Glucose-Capillary: 138 mg/dL — ABNORMAL HIGH (ref 65–99)
Glucose-Capillary: 184 mg/dL — ABNORMAL HIGH (ref 65–99)

## 2016-02-22 LAB — PROTIME-INR
INR: 1.42 (ref 0.00–1.49)
PROTHROMBIN TIME: 17.4 s — AB (ref 11.6–15.2)

## 2016-02-22 SURGERY — ECHOCARDIOGRAM, TRANSESOPHAGEAL
Anesthesia: Moderate Sedation

## 2016-02-22 MED ORDER — FENTANYL CITRATE (PF) 100 MCG/2ML IJ SOLN
INTRAMUSCULAR | Status: DC | PRN
  Administered 2016-02-22 (×2): 25 ug via INTRAVENOUS

## 2016-02-22 MED ORDER — RIVAROXABAN 20 MG PO TABS: 20.0000 mg | ORAL_TABLET | Freq: Every day | ORAL | Status: DC

## 2016-02-22 MED ORDER — ACETAMINOPHEN 325 MG PO TABS
650.0000 mg | ORAL_TABLET | Freq: Four times a day (QID) | ORAL | Status: DC | PRN
Start: 2016-02-22 — End: 2016-03-24

## 2016-02-22 MED ORDER — DIPHENHYDRAMINE HCL 50 MG/ML IJ SOLN
INTRAMUSCULAR | Status: AC
  Filled 2016-02-22: qty 1

## 2016-02-22 MED ORDER — SODIUM CHLORIDE 0.9 % IV SOLN
INTRAVENOUS | Status: DC
  Administered 2016-02-22: 500 mL via INTRAVENOUS

## 2016-02-22 MED ORDER — GABAPENTIN 100 MG PO CAPS: 200.0000 mg | ORAL_CAPSULE | Freq: Three times a day (TID) | ORAL | Status: DC

## 2016-02-22 MED ORDER — INSULIN GLARGINE 100 UNIT/ML SOLOSTAR PEN: 20.0000 [IU] | PEN_INJECTOR | Freq: Every day | SUBCUTANEOUS | Status: DC

## 2016-02-22 MED ORDER — FENTANYL CITRATE (PF) 100 MCG/2ML IJ SOLN
INTRAMUSCULAR | Status: AC
  Filled 2016-02-22: qty 2

## 2016-02-22 MED ORDER — DULOXETINE HCL 30 MG PO CPEP: 30.0000 mg | ORAL_CAPSULE | Freq: Every day | ORAL | Status: DC

## 2016-02-22 MED ORDER — INSULIN ASPART 100 UNIT/ML FLEXPEN: 0.0000 [IU] | PEN_INJECTOR | Freq: Three times a day (TID) | SUBCUTANEOUS | Status: DC

## 2016-02-22 MED ORDER — MIDAZOLAM HCL 10 MG/2ML IJ SOLN
INTRAMUSCULAR | Status: DC | PRN
Start: 2016-02-22 — End: 2016-02-22
  Administered 2016-02-22: 1 mg via INTRAVENOUS
  Administered 2016-02-22 (×2): 2 mg via INTRAVENOUS

## 2016-02-22 MED ORDER — MIDAZOLAM HCL 5 MG/ML IJ SOLN
INTRAMUSCULAR | Status: AC
  Filled 2016-02-22: qty 2

## 2016-02-22 MED ORDER — LIDOCAINE VISCOUS 2 % MT SOLN
OROMUCOSAL | Status: DC | PRN
  Administered 2016-02-22: 15 mL via OROMUCOSAL

## 2016-02-22 MED ORDER — RIVAROXABAN 20 MG PO TABS
20.0000 mg | ORAL_TABLET | Freq: Every day | ORAL | Status: DC
  Administered 2016-02-22: 20 mg via ORAL
  Filled 2016-02-22: qty 1

## 2016-02-22 MED ORDER — LIDOCAINE VISCOUS 2 % MT SOLN
OROMUCOSAL | Status: AC
  Filled 2016-02-22: qty 15

## 2016-02-22 MED FILL — XARELTO 20 MG TABLET: 20 | 30 days supply | Qty: 30 | Fill #0

## 2016-02-22 MED FILL — GABAPENTIN 100 MG CAPSULE: 100 | 30 days supply | Qty: 120 | Fill #0

## 2016-02-22 MED FILL — NOVOLOG FLEXPEN SYRINGE: 100 | 30 days supply | Qty: 15 | Fill #0

## 2016-02-22 MED FILL — LANTUS SOLOSTAR 100 UNITS/M: 100 | 30 days supply | Qty: 6 | Fill #0

## 2016-02-22 MED FILL — DULoxetine HCL 30 MG CPEP: 30 | 30 days supply | Qty: 30 | Fill #0

## 2016-02-22 NOTE — Discharge Instructions (Addendum)
Information on my medicine - XARELTO (Rivaroxaban)  This medication education was reviewed with me or my healthcare representative as part of my discharge preparation.  The pharmacist that spoke with me during my hospital stay was:  Bajbus, Lauren, RPH  Why was Xarelto prescribed for you? Xarelto was prescribed for you to reduce the risk of a blood clot forming.  What do you need to know about xarelto ? Take your Xarelto ONCE DAILY at the same time every day with your evening meal. If you have difficulty swallowing the tablet whole, you may crush it and mix in applesauce just prior to taking your dose.  Take Xarelto exactly as prescribed by your doctor and DO NOT stop taking Xarelto without talking to the doctor who prescribed the medication.  Stopping without other medication to take the place of Xarelto may increase your risk of developing a clot.  Refill your prescription before you run out.  After discharge, you should have regular check-up appointments with your healthcare provider that is prescribing your Xarelto.  In the future your dose may need to be changed if your kidney function or weight changes by a significant amount.  What do you do if you miss a dose? If you are taking Xarelto ONCE DAILY and you miss a dose, take it as soon as you remember on the same day then continue your regularly scheduled once daily regimen the next day. Do not take two doses of Xarelto at the same time or on the same day.   Important Safety Information A possible side effect of Xarelto is bleeding. You should call your healthcare provider right away if you experience any of the following: ? Bleeding from an injury or your nose that does not stop. ? Unusual colored urine (red or dark brown) or unusual colored stools (red or black). ? Unusual bruising for unknown reasons. ? A serious fall or if you hit your head (even if there is no bleeding).  Some medicines may interact with Xarelto and  might increase your risk of bleeding while on Xarelto. To help avoid this, consult your healthcare provider or pharmacist prior to using any new prescription or non-prescription medications, including herbals, vitamins, non-steroidal anti-inflammatory drugs (NSAIDs) and supplements.  This website has more information on Xarelto: VisitDestination.com.br.  Syncope Syncope is a medical term for fainting or passing out. This means you lose consciousness and drop to the ground. People are generally unconscious for less than 5 minutes. You may have some muscle twitches for up to 15 seconds before waking up and returning to normal. Syncope occurs more often in older adults, but it can happen to anyone. While most causes of syncope are not dangerous, syncope can be a sign of a serious medical problem. It is important to seek medical care.  CAUSES  Syncope is caused by a sudden drop in blood flow to the brain. The specific cause is often not determined. Factors that can bring on syncope include:  Taking medicines that lower blood pressure.  Sudden changes in posture, such as standing up quickly.  Taking more medicine than prescribed.  Standing in one place for too long.  Seizure disorders.  Dehydration and excessive exposure to heat.  Low blood sugar (hypoglycemia).  Straining to have a bowel movement.  Heart disease, irregular heartbeat, or other circulatory problems.  Fear, emotional distress, seeing blood, or severe pain. SYMPTOMS  Right before fainting, you may:  Feel dizzy or light-headed.  Feel nauseous.  See all white or  all black in your field of vision.  Have cold, clammy skin. DIAGNOSIS  Your health care provider will ask about your symptoms, perform a physical exam, and perform an electrocardiogram (ECG) to record the electrical activity of your heart. Your health care provider may also perform other heart or blood tests to determine the cause of your syncope which may  include:  Transthoracic echocardiogram (TTE). During echocardiography, sound waves are used to evaluate how blood flows through your heart.  Transesophageal echocardiogram (TEE).  Cardiac monitoring. This allows your health care provider to monitor your heart rate and rhythm in real time.  Holter monitor. This is a portable device that records your heartbeat and can help diagnose heart arrhythmias. It allows your health care provider to track your heart activity for several days, if needed.  Stress tests by exercise or by giving medicine that makes the heart beat faster. TREATMENT  In most cases, no treatment is needed. Depending on the cause of your syncope, your health care provider may recommend changing or stopping some of your medicines. HOME CARE INSTRUCTIONS  Have someone stay with you until you feel stable.  Do not drive, use machinery, or play sports until your health care provider says it is okay.  Keep all follow-up appointments as directed by your health care provider.  Lie down right away if you start feeling like you might faint. Breathe deeply and steadily. Wait until all the symptoms have passed.  Drink enough fluids to keep your urine clear or pale yellow.  If you are taking blood pressure or heart medicine, get up slowly and take several minutes to sit and then stand. This can reduce dizziness. SEEK IMMEDIATE MEDICAL CARE IF:   You have a severe headache.  You have unusual pain in the chest, abdomen, or back.  You are bleeding from your mouth or rectum, or you have black or tarry stool.  You have an irregular or very fast heartbeat.  You have pain with breathing.  You have repeated fainting or seizure-like jerking during an episode.  You faint when sitting or lying down.  You have confusion.  You have trouble walking.  You have severe weakness.  You have vision problems. If you fainted, call your local emergency services (911 in [place] Do not drive  yourself to the hospital.    This information is not intended to replace advice given to you by your health care provider. Make sure you discuss any questions you have with your health care provider.   Document Released: 12/15/2005 Document Revised: 05/01/2015 Document Reviewed: 02/13/2012 Elsevier Interactive Patient Education [employer]

## 2016-02-22 NOTE — Discharge Summary (Signed)
Physician Discharge Summary  Patrick Small  XBJ:[id number]  DOB: [birth year]  DOA: 02/17/2016  PCP: Lora Paula, MD  Admit date: 02/17/2016 Discharge date: 02/22/2016  Time spent: Greater than 30 minutes  Recommendations for Outpatient Follow-up:  1. Gypsy Balsam, NP/EPS on 5/23 at 9:20 AM. 2. [facility]/PCP on 03/04/16 at 10:30 AM. To be seen with repeat labs (CBC & CMP). Please follow all final blood culture results that were sent from the hospital.  Discharge Diagnoses:  Principal Problem:   Depression Active Problems:   DM type 2 with diabetic peripheral neuropathy (HCC)   Hypertension   Hypothyroidism   Atrial fibrillation (HCC)   AKI (acute kidney injury) (HCC)   Hypokalemia   Hyponatremia   Syncope   Elevated lactic acid level   Dehydration   Sinus node dysfunction (HCC)   Back pain   Transaminasemia   Pacemaker infection (HCC)   Discharge Condition: Improved & Stable  Diet recommendation: Heart healthy and diabetic diet.  Filed Weights   02/20/16 0456 02/20/16 2035 02/22/16 0504  Weight: 80.015 kg (176 lb 6.4 oz) 80.2 kg (176 lb 12.9 oz) 79.9 kg (176 lb 2.4 oz)    History of present illness:  55 year old male with a history of insulin-dependent diabetes mellitus, sick sinus syndrome status post PPM, alcohol dependence presented with generalized pain all over and syncope. The patient has been self-medicating himself with alcohol for chronic back pain. He drinks at least 2 x 40 oz beers daily with occasional liquor. He quit drinking about one week prior to this admission. On the day of admission, he woke up with generalized body aches and was going to the bathroom when he had a syncopal episode. He states that he has not been taking his insulin faithfully. He checked his sugars earlier on the day of admission and noted it to be >600. He gave himself 40 units of insulin although he cannot tell me the type of insulin (MAR  lists Lantus). In the ED he was actually hypoglycemic with symptoms because of 55. The patient was noted to have acute kidney injury with a serum creatinine 1.5 and lactic acid 3.5. The patient was started on fluid resuscitation. He also had relative hypotension with systolic blood pressure of 95 in the emergency department. Please note pt has essentially not taken any medications for over 8 months.  Hospital Course:   Syncope  -likely secondary to volume depletion - patient has been having polydipsia and polyuria for the past week prior to admission  -Patient may have hadHONK at home - To rule out arrhythmia as cause of syncope, cardiology was consulted. Cardiology input 2/21 appreciated: Normal device function. Cardiology will arrange outpatient follow-up. -orthostatics--neg -echo-- 02/19/16: LVEF 55-60 percent. 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 Please see discussion below. -UDS positive for THC -EKG paced, nonspecific T wave changes - Patient advised not to drive until cleared by outpatient M.D. during follow-up.  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 Blood cultures 2 from admission: negative to date. - TEE results as below. As per cardiology follow-up note and discussion with them, in the absence of symptoms (no fever, chills, sweats) negative blood cultures to date, normal WBC, this is unlikely to be due to infection. Cardiology indicates that TEE shows fibrin stranding on his lead. As per discussion with EPS cardiology and infectious disease, no indication of removing pacemaker at this time and  no antibiotics at discharge and continue to monitor for signs of infection as outpatient.   Lactic acidosis  -Secondary to volume depletion  - Resolved after IV fluids. -follow blood cultures--neg to date -CXR neg for infiltrates  Diabetes mellitus type 2 with  polyneuropathy -Hemoglobin A1c--12.1  - Continue home dose of Lantus. Added NovoLog SSI at discharge. -Started gabapentin - Counseled extensively regarding compliance with medications.  Acute kidney injury  -Secondary to volume depletion  - resolved.  History of DVT, recurrent - Patient stated that he had 3 prior DVTs while he lived in [place]. He stated that he took Coumadin up to 3 weeks ago and was followed up at the wellness clinic but then he stopped taking it for no specific reason.  - In the hospital he was started on Lovenox bridge and Coumadin. However upon extensive discussion with patient and cardiology service, it was felt appropriate to switch him to [facility] which would also improve compliance. Patient agreeable to same after discussing risks and benefits. Xarelto started.  Transaminasemia - hepatitis B surface antigen: Negative  -Hepatitis C antibody <0.1 -HIV--neg -RUQ ultrasound: Report as below. No acute issues. - Unclear etiology. CK normal. Patient has had transient mild abnormal transaminases chronically dating back to 2011. - ? Related to alcohol dependence.  Depression -appreciate pyschiatry input -started cymbalta  Hyponatremia -secondary to volume depletion - Resolved.   Hypokalemia/hypomagnesemia  -repleted -check mag-- 1.6   Chronic back pain -X-ray thoracic and lumbar spine--negative for fractures or dislocations   History of HTN - Fluctuating and mildly uncontrolled -monitor  Hx Hypothyroidism -TSH 0.443 -recheck TSH in one month  Thrombocytopenia - Stable.   Dental caries  - Recommend outpatient dental consultation.  Alcohol dependence - Cessation counseled.  Consultations:  Cardiology/EPS  Infectious disease  Procedures:  2-D echo 02/19/16:  Study Conclusions  - Left ventricle: The cavity size was normal. There was mild focal basal hypertrophy of the septum. Systolic function was normal. The estimated  ejection fraction was in the range of 55% to 60%. Wall motion was normal; there were no regional wall motion abnormalities. - Mitral valve: Calcified annulus. - Right atrium: There is a large mobile density seen on the atrial side of the tricuspid valve that appears to emanate from the ventricular pacemaker wire. This is consistent with vegetation. - Pulmonary arteries: PA peak pressure: 34 mm Hg (S). 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 - Recommendations: Consider transesophageal echocardiography ifor further evaluation.  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  TEE 02/22/16: Study Conclusions  - Left ventricle: Systolic function was normal. The estimated ejection fraction was in the range of 55% to 60%. Wall motion was normal; there were no regional wall motion abnormalities. - Mitral valve: Mildly calcified annulus. - Left atrium: No evidence of thrombus in the atrial cavity or appendage. No evidence of thrombus in the atrial cavity or appendage. - Right ventricle: Pacer wire or catheter noted in right ventricle with two mobile targets on the RA side of the lead. There is a 0.37 cm x 0.86 cm target superiorly and a 2.2 cm x 0.39 cm lesion closer to the valve. - Right atrium: Pacer wire or catheter noted in right atrium. No evidence of thrombus in the atrial cavity or appendage. - Atrial septum: No defect or patent foramen ovale was identified by color flow Doppler.  Impressions:  - There is a 2.2 cm x 0.4  cm mobile target on the RV pacemaker lead as well as a 0.4 x 0.9cm target supereiorly on the same lead. The RA lead is unaffected. The differential includes thrombus, fibrin sheath and vegetation.   Discharge Exam:  Complaints: Patient seen post TEE. Denies complaints. Ambulating comfortably in the  halls.  Filed Vitals:   02/22/16 0900 02/22/16 0910 02/22/16 0917 02/22/16 0928  BP: 118/84 135/83 129/87 122/86  Pulse: 59 59 60 59  Temp:    97.5 F (36.4 C)  TempSrc:    Oral  Resp: 12 10 16 17   Height:      Weight:      SpO2: 98% 100%  100%    General exam: Pleasant middle-aged male lying comfortably in bed. Respiratory system: Clear. No increased work of breathing. Cardiovascular system: S1 & S2 heard, RRR. No JVD, murmurs, gallops, clicks or pedal edema. Telemetry: SR/occasional a pacing. Gastrointestinal system: Abdomen is nondistended, soft and nontender. Normal bowel sounds heard. Central nervous system: Alert and oriented. No focal neurological deficits. Extremities: Symmetric 5 x 5 power.  Discharge Instructions      Discharge Instructions    Call MD for:  difficulty breathing, headache or visual disturbances    Complete by:  As directed      Call MD for:  extreme fatigue    Complete by:  As directed      Call MD for:  persistant dizziness or light-headedness    Complete by:  As directed      Call MD for:  persistant nausea and vomiting    Complete by:  As directed      Call MD for:  severe uncontrolled pain    Complete by:  As directed      Call MD for:  temperature >100.4    Complete by:  As directed      Call MD for:    Complete by:  As directed   Passing out or feel like passing out.     Diet - low sodium heart healthy    Complete by:  As directed      Diet Carb Modified    Complete by:  As directed      Driving Restrictions    Complete by:  As directed   Do not drive until cleared by outpatient physician during follow-up.     Increase activity slowly    Complete by:  As directed             Medication List    TAKE these medications        acetaminophen 325 MG tablet  Commonly known as:  TYLENOL  Take 2 tablets (650 mg total) by mouth every 6 (six) hours as needed for mild pain, moderate pain, fever or headache.     DULoxetine 30 MG  capsule  Commonly known as:  CYMBALTA  Take 1 capsule (30 mg total) by mouth daily.     gabapentin 100 MG capsule  Commonly known as:  NEURONTIN  Take 2 capsules (200 mg total) by mouth 3 (three) times daily.     insulin aspart 100 UNIT/ML FlexPen  Commonly known as:  NOVOLOG  Inject 0-9 Units into the skin 3 (three) times daily with meals. CBG < 70: Eat or drink something and recheck, CBG 70 - 120: 0 units CBG 121 - 150: 0 units CBG 151 - 200: 0 units CBG 201 - 250: 2 units CBG 251 - 300: 3 units CBG 301 - 350: 4 units  CBG 351 - 400: 5 units CBG > 400: call MD.     Insulin Glargine 100 UNIT/ML Solostar Pen  Commonly known as:  LANTUS SOLOSTAR  Inject 20 Units into the skin daily at 10 pm.     rivaroxaban 20 MG Tabs tablet  Commonly known as:  XARELTO  Take 1 tablet (20 mg total) by mouth daily with supper.       Follow-up Information    Follow up with Marily Lente, NP On 05/20/2016.   Specialty:  Cardiology   Why:  at 9:20AM    Contact information:   [address] [place] [address] [phone]       Follow up with [facility].   Why:  Appointment scheduled for Tuesday, March 04, 2016 at 10:30am. Please call if you are unable to keep this appointment. To be seen with repeat labs (CBC & CMP).   Contact information:   [address] [place] [address] [phone]      Get Medicines reviewed and adjusted: Please take all your medications with you for your next visit with your Primary MD  Please request your Primary MD to go over all hospital tests and procedure/radiological results at the follow up. Please ask your Primary MD to get all Hospital records sent to his/her office.  If you experience worsening of your admission symptoms, develop shortness of breath, life threatening emergency, suicidal or homicidal thoughts you must seek medical attention immediately by calling 911 or calling your MD immediately if  symptoms less severe.  You must read complete instructions/literature along with all the possible adverse reactions/side effects for all the Medicines you take and that have been prescribed to you. Take any new Medicines after you have completely understood and accept all the possible adverse reactions/side effects.   Do not drive when taking pain medications.   Do not take more than prescribed Pain, Sleep and Anxiety Medications  Special Instructions: If you have smoked or chewed Tobacco in the last 2 yrs please stop smoking, stop any regular Alcohol and or any Recreational drug use.  Wear Seat belts while driving.  Please note  You were cared for by a hospitalist during your hospital stay. Once you are discharged, your primary care physician will handle any further medical issues. Please note that NO REFILLS for any discharge medications will be authorized once you are discharged, as it is imperative that you return to your primary care physician (or establish a relationship with a primary care physician if you do not have one) for your aftercare needs so that they can reassess your need for medications and monitor your lab values.    The results of significant diagnostics from this hospitalization (including imaging, microbiology, ancillary and laboratory) are listed below for reference.    Significant Diagnostic Studies: Dg Chest 2 View  02/18/2016  CLINICAL DATA:  Syncope with tachypnea. EXAM: CHEST  2 VIEW COMPARISON:  09/18/2014 FINDINGS: Dual lead left-sided pacemaker remains in place.The cardiomediastinal contours are normal. The lungs are clear. Pulmonary vasculature is normal. No consolidation, pleural effusion, or pneumothorax. No acute osseous abnormalities are seen. IMPRESSION: No acute process. Electronically Signed   By: Rubye Oaks M.D.   On: 02/18/2016 00:03   Dg Cervical Spine Complete  02/18/2016  CLINICAL DATA:  Syncope with neck pain tonight. EXAM: CERVICAL SPINE  - COMPLETE 4+ VIEW COMPARISON:  None. FINDINGS: Cervical spine alignment is maintained. Vertebral body heights and intervertebral disc spaces  are preserved. The dens is intact. Posterior elements appear well-aligned. Scattered facet arthropathy. There is no evidence of fracture. No prevertebral soft tissue edema. IMPRESSION: Minimal degenerative change without acute abnormality of the cervical spine. Electronically Signed   By: Rubye Oaks M.D.   On: 02/18/2016 00:04   Dg Thoracic Spine 2 View  02/18/2016  CLINICAL DATA:  Chronic back pain. EXAM: LUMBAR SPINE - 2-3 VIEW; THORACIC SPINE 2 VIEWS COMPARISON:  Chest x-ray 02/17/2016 FINDINGS: Thoracic spine: Normal alignment of the thoracic vertebral bodies. Mild compression deformities of T8, T7 and T5 appear stable. Stable degenerative changes. No destructive bony changes or abnormal paraspinal soft tissue swelling. Pacer wires are noted. Lumbar spine: Normal alignment of the lumbar vertebral bodies. No acute fracture or destructive bony change. Mild facet degenerative changes at L4-5 and L5-S1 but no definite pars defects. The visualized bony pelvis is intact and the SI joints appear normal. IMPRESSION: 1. Degenerative changes and probable remote mild thoracic compression deformities. 2. Normal alignment and no acute bony findings in the lumbar spine. Electronically Signed   By: Rudie Meyer M.D.   On: 02/18/2016 21:35   Dg Lumbar Spine 2-3 Views  02/18/2016  CLINICAL DATA:  Chronic back pain. EXAM: LUMBAR SPINE - 2-3 VIEW; THORACIC SPINE 2 VIEWS COMPARISON:  Chest x-ray 02/17/2016 FINDINGS: Thoracic spine: Normal alignment of the thoracic vertebral bodies. Mild compression deformities of T8, T7 and T5 appear stable. Stable degenerative changes. No destructive bony changes or abnormal paraspinal soft tissue swelling. Pacer wires are noted. Lumbar spine: Normal alignment of the lumbar vertebral bodies. No acute fracture or destructive bony change. Mild  facet degenerative changes at L4-5 and L5-S1 but no definite pars defects. The visualized bony pelvis is intact and the SI joints appear normal. IMPRESSION: 1. Degenerative changes and probable remote mild thoracic compression deformities. 2. Normal alignment and no acute bony findings in the lumbar spine. Electronically Signed   By: Rudie Meyer M.D.   On: 02/18/2016 21:35   Ct Head Wo Contrast  02/18/2016  CLINICAL DATA:  Acute onset of unwitnessed syncope and collapse at home. Headache. Hit left side of forehead. Initial encounter. EXAM: CT HEAD WITHOUT CONTRAST TECHNIQUE: Contiguous axial images were obtained from the base of the skull through the vertex without intravenous contrast. COMPARISON:  None. FINDINGS: There is no evidence of acute infarction, mass lesion, or intra- or extra-axial hemorrhage on CT. Scattered periventricular and subcortical white matter change likely reflects small vessel ischemic microangiopathy. The posterior fossa, including the cerebellum, brainstem and fourth ventricle, is within normal limits. The third and lateral ventricles, and basal ganglia are unremarkable in appearance. The cerebral hemispheres are symmetric in appearance, with normal gray-white differentiation. No mass effect or midline shift is seen. There is no evidence of fracture; visualized osseous structures are unremarkable in appearance. The visualized portions of the orbits are within normal limits. The paranasal sinuses and mastoid air cells are well-aerated. No significant soft tissue abnormalities are seen. IMPRESSION: 1. No acute intracranial pathology seen on CT. 2. Scattered small vessel ischemic microangiopathy. Electronically Signed   By: Roanna Raider M.D.   On: 02/18/2016 00:10   US Abdomen Limited Ruq  02/20/2016  CLINICAL DATA:  Increased transaminases EXAM: US ABDOMEN LIMITED - RIGHT UPPER QUADRANT COMPARISON:  02/04/2010 FINDINGS: Gallbladder: No shadowing gallstones are noted within  gallbladder. There is 5.6 mm gallbladder wall polyp. No thickening of gallbladder wall. No sonographic Murphy's sign. Common bile duct: Diameter: 3 mm in diameter within  normal limits. Liver: No focal lesion identified. Within normal limits in parenchymal echogenicity. IMPRESSION: No shadowing gallstones are noted within gallbladder. There is 5.6 mm gallbladder wall polyp. No thickening of gallbladder wall. No sonographic Murphy's sign. Normal CBD. Electronically Signed   By: Natasha Mead M.D.   On: 02/20/2016 08:04    Microbiology: Recent Results (from the past 240 hour(s))  Blood culture (routine x 2)     Status: None (Preliminary result)   Collection Time: 02/18/16  2:33 AM  Result Value Ref Range Status   Specimen Description BLOOD RIGHT ANTECUBITAL  Final   Special Requests IN PEDIATRIC BOTTLE 4CC  Final   Culture NO GROWTH 3 DAYS  Final   Report Status PENDING  Incomplete  Blood culture (routine x 2)     Status: None (Preliminary result)   Collection Time: 02/18/16  2:36 AM  Result Value Ref Range Status   Specimen Description BLOOD LEFT ANTECUBITAL  Final   Special Requests IN PEDIATRIC BOTTLE 4CC  Final   Culture NO GROWTH 3 DAYS  Final   Report Status PENDING  Incomplete     Labs: Basic Metabolic Panel:  Recent Labs Lab 02/17/16 2256 02/18/16 0230 02/18/16 0244 02/18/16 0612 02/19/16 0643 02/20/16 0821 02/21/16 0629  NA 134*  --  139  --  132* 137 135  K 2.9*  --  3.9  --  3.4* 3.8 3.7  CL 94*  --  101  --  101 102 102  CO2 26  --  27  --  24 24 25   GLUCOSE 55*  --  223*  --  292* 201* 151*  BUN 9  --  9  --  6 <5* 7  CREATININE 1.85*  --  1.38* 1.41* 0.90 0.74 0.83  CALCIUM 10.1  --  9.6  --  8.9 9.3 9.3  MG  --  1.8  --   --  1.6* 2.0  --    Liver Function Tests:  Recent Labs Lab 02/17/16 2256 02/19/16 0643 02/20/16 0821 02/21/16 0629  AST 42* 98* 99* 83*  ALT 40 62 74* 71*  ALKPHOS 241* 194* 235* 222*  BILITOT 0.6 0.8 0.8 0.5  PROT 8.1 6.1* 6.7 6.3*   ALBUMIN 4.2 3.0* 3.5 3.3*   No results for input(s): LIPASE, AMYLASE in the last 168 hours. No results for input(s): AMMONIA in the last 168 hours. CBC:  Recent Labs Lab 02/17/16 2256 02/18/16 0244 02/19/16 0643 02/20/16 0821 02/21/16 0629 02/22/16 0442  WBC 10.8* 9.3 5.3 4.6 4.2 4.5  NEUTROABS 7.2  --   --   --   --   --   HGB 15.2 12.9* 12.3* 13.3 13.3 13.6  HCT 42.1 36.0* 34.6* 37.1* 36.6* 38.1*  MCV 91.7 92.3 91.3 91.2 92.7 94.1  PLT 162 130* 102* 114* 113* 128*   Cardiac Enzymes:  Recent Labs Lab 02/18/16 0230 02/18/16 0612 02/21/16 0629  CKTOTAL  --   --  77  TROPONINI <0.03 <0.03  --    BNP: BNP (last 3 results) No results for input(s): BNP in the last 8760 hours.  ProBNP (last 3 results) No results for input(s): PROBNP in the last 8760 hours.  CBG:  Recent Labs Lab 02/21/16 1628 02/21/16 2117 02/22/16 0740 02/22/16 0927 02/22/16 1202  GLUCAP 165* 196* 169* 138* 184*       Signed:  Marcellus Scott, MD, FACP, FHM. [facility] Pager [phone]  If 7PM-7AM, please contact night-coverage [email or web address] Password [facility] 02/22/2016, 2:45  PM

## 2016-02-22 NOTE — Care Management Note (Signed)
Case Management Note  Patient Details  Name: Norbert Malkin MRN: [id number] Date of Birth: 06-27-1961  Subjective/Objective:             CM following for progression and d/c planning.       Action/Plan: 02/22/2016 Informed by MD that pt will d/c to home on Xarelto, attempting to deliver 30day free trial offer card to pt , however pt off the unit. Will see pt later to give care and inform of followup appointment arranged at [facility] and [facility] for March 04, 2016 @ 10:30am. Will continue to follow.   Expected Discharge Date:                  Expected Discharge Plan:  Home w Home Health Services  In-House Referral:  Clinical Social Work  Discharge planning Services  CM Consult, Medication Assistance, Indigent Health Clinic  Post Acute Care Choice:  Home Health Choice offered to:  Patient  DME Arranged:  N/A DME Agency:  NA  HH Arranged:  RN HH Agency:     Status of Service:  Completed, signed off  Medicare Important Message Given:    Date Medicare IM Given:    Medicare IM give by:    Date Additional Medicare IM Given:    Additional Medicare Important Message give by:     If discussed at Long Length of Stay Meetings, dates discussed:    Additional Comments:  Starlyn Skeans, RN 02/22/2016, 11:59 AM

## 2016-02-22 NOTE — Interval H&P Note (Signed)
History and Physical Interval Note:  02/22/2016 5:50 AM  Patrick Small  has presented today for surgery, with the diagnosis of bactermia  The various methods of treatment have been discussed with the patient and family. After consideration of risks, benefits and other options for treatment, the patient has consented to  Procedure(s): TRANSESOPHAGEAL ECHOCARDIOGRAM (TEE) (N/A) as a surgical intervention .  The patient's history has been reviewed, patient examined, no change in status, stable for surgery.  I have reviewed the patient's chart and labs.  Questions were answered to the patient's satisfaction.     Madilyn Hook, MD

## 2016-02-22 NOTE — Care Management Note (Addendum)
Case Management Note  Patient Details  Name: Patrick Small MRN: [id number] Date of Birth: May 24, 1961  Subjective/Objective:        CM following for progression and d/c planning.            Action/Plan: 02/22/2016 Xarelto 30 day free card given and explained to pt , and [facility] appointment discussed.   Expected Discharge Date:   02/22/2016              Expected Discharge Plan:  Home w Home Health Services  In-House Referral:  Clinical Social Work  Discharge planning Services  Medication Assistance, Indigent Health Clinic  Post Acute Care Choice:   Choice offered to:    DME Arranged:  N/A DME Agency:  NA  HH Arranged:  NA  HH Agency:   NA  Status of Service:  Completed, signed off  Medicare Important Message Given:    Date Medicare IM Given:    Medicare IM give by:    Date Additional Medicare IM Given:    Additional Medicare Important Message give by:     If discussed at Long Length of Stay Meetings, dates discussed:    Additional Comments:  Starlyn Skeans, RN 02/22/2016, 2:10 PM

## 2016-02-22 NOTE — Progress Notes (Signed)
Patient discharged home per MD. Prescriptions provided to patient. Case manager provided prescription card for Xarelto. Education fact sheet provided for xarelto. Discharge instructions reviewed. Teach back used to verify patient understanding of follow up appts and medications. NSL removed with catheter intact. Patient ambulated off the floor for discharge per choice. Bess Kinds, RN

## 2016-02-22 NOTE — H&P (View-Only) (Signed)
ELECTROPHYSIOLOGY CONSULT NOTE    Patient ID: Patrick Small MRN: [id number], DOB/AGE: August 21, 1961 55 y.o.  Admit date: 02/17/2016 Date of Consult: 02/21/2016  Primary Physician: Lora Paula, MD Primary Cardiologist: Elberta Fortis (new this admission)  Reason for Consultation: possible vegetation on pacemaker lead  HPI:  Patrick Small is a 55 y.o. male with a past medical history significant for diabetes, hypertension, alcohol abuse, recurrent DVT, and sick sinus syndrome s/p PPM implant. He presented to the hospital for syncope and generalized pain. Lab work was notable for AKI and he has been fluid resuscitated. Device interrogation during this admission demonstrated normal device function and no arrhythmias on the day of admission.  Echocardiogram demonstrated EF 55-60%, no RWMA, large mobile density seen on the atrial side of the tricuspid valve that appears to come from the RV pacing wire. TEE was recommended.  EP has been asked to evaluate for treatment options.   He currently denies chest pain, shortness of breath, nausea, vomiting, recent fevers or chills.   Past Medical History  Diagnosis Date  . Acid reflux   . Acne rosacea   . Plantar fasciitis   . Thyroid disease   . Mood disorder (HCC)   . Diabetes mellitus without complication (HCC) Dx 2010  . Hyperlipidemia Dx 2010  . Hypertension Dx 2010  . Substance abuse     last alchohol intake 01/24/2013  . DVT (deep venous thrombosis) (HCC) 2012 and 2013     Left and Right leg, no history of PE       Surgical History:  Past Surgical History  Procedure Laterality Date  . Pacemaker insertion  2014     Prescriptions prior to admission  Medication Sig Dispense Refill Last Dose  . Insulin Glargine (LANTUS SOLOSTAR) 100 UNIT/ML Solostar Pen Inject 25 Units into the skin daily. (Patient taking differently: Inject 20 Units into the skin daily. ) 30 mL 3 02/17/2016 at Unknown time    Inpatient Medications:  . DULoxetine   30 mg Oral Daily  . enoxaparin (LOVENOX) injection  1 mg/kg Subcutaneous Q12H  . gabapentin  200 mg Oral TID  . insulin aspart  0-5 Units Subcutaneous QHS  . insulin aspart  0-9 Units Subcutaneous TID WC  . insulin glargine  20 Units Subcutaneous QHS  . sodium chloride flush  3 mL Intravenous Q12H  . warfarin  10 mg Oral ONCE-1800  . Warfarin - Pharmacist Dosing Inpatient   Does not apply q1800    Allergies:  Allergies  Allergen Reactions  . Penicillins Hives  . Bacitracin Itching, Swelling and Rash  . Septra [Sulfamethoxazole-Trimethoprim] Itching, Swelling and Rash    Social History   Social History  . Marital Status: Single    Spouse Name: N/A  . Number of Children: N/A  . Years of Education: N/A   Occupational History  . Not on file.   Social History Main Topics  . Smoking status: Current Every Day Smoker -- 6.00 packs/day    Types: Cigarettes  . Smokeless tobacco: Never Used  . Alcohol Use: Not on file  . Drug Use: Not on file  . Sexual Activity: Not on file   Other Topics Concern  . Not on file   Social History Narrative   Moved back to [place] after living in [place], [place] 08/2014.    Moved to be closer to his aging mother.   Lived in [place] for 15 years.            Family History  Problem Relation Age of Onset  . Diabetes Mother   . Diabetes Father   . Diabetes Sister      Review of Systems: All other systems reviewed and are otherwise negative except as noted above.  Physical Exam: Filed Vitals:   02/20/16 1031 02/20/16 1737 02/20/16 2035 02/21/16 0757  BP: 152/90 147/109 172/107 125/87  Pulse: 68 59 62 61  Temp: 98.4 F (36.9 C) 98.7 F (37.1 C) 98.7 F (37.1 C) 97.9 F (36.6 C)  TempSrc: Oral Oral Oral Oral  Resp: Height:      Weight:   176 lb 12.9 oz (80.2 kg)   SpO2: 100% 100% 100% 100%    GEN- The patient is well appearing, alert and oriented x 3 today.   HEENT: normocephalic, atraumatic; sclera clear, conjunctiva  pink; hearing intact; oropharynx clear; neck supple  Lungs- Clear to ausculation bilaterally, normal work of breathing.  No wheezes, rales, rhonchi Heart- Regular rate and rhythm, no murmurs, rubs or gallops  GI- soft, non-tender, non-distended, bowel sounds present  Extremities- no clubbing, cyanosis, or edema; DP/PT/radial pulses 2+ bilaterally MS- no significant deformity or atrophy Skin- warm and dry, no rash or lesion Psych- euthymic mood, full affect Neuro- strength and sensation are intact  Labs:   Lab Results  Component Value Date   WBC 4.2 02/21/2016   HGB 13.3 02/21/2016   HCT 36.6* 02/21/2016   MCV 92.7 02/21/2016   PLT 113* 02/21/2016    Recent Labs Lab 02/21/16 0629  NA 135  K 3.7  CL 102  CO2 25  BUN 7  CREATININE 0.83  CALCIUM 9.3  PROT 6.3*  BILITOT 0.5  ALKPHOS 222*  ALT 71*  AST 83*  GLUCOSE 151*      Radiology/Studies: Dg Chest 2 View 02/18/2016  CLINICAL DATA:  Syncope with tachypnea. EXAM: CHEST  2 VIEW COMPARISON:  09/18/2014 FINDINGS: Dual lead left-sided pacemaker remains in place.The cardiomediastinal contours are normal. The lungs are clear. Pulmonary vasculature is normal. No consolidation, pleural effusion, or pneumothorax. No acute osseous abnormalities are seen. IMPRESSION: No acute process. Electronically Signed   By: Rubye Oaks M.D.   On: 02/18/2016 00:03   EKG: atrial pacing with intrinsic ventricular conduction at 60  TELEMETRY: atrial pacing with intrinsic ventricular conduction   DEVICE HISTORY: BSX dual chamber pacemaker implanted 2014 for sick sinus syndrome   Assessment/Plan: 1.  Possible vegetation on pacemaker lead TEE is indicated  Rebekah Zackery plan TEE for tomorrow morning at 8AM - risks, benefits discussed with the patient by Dr Elberta Fortis who wishes to proceed Jessy Cybulski obtain blood cultures and would recommend ID consult  2.  Sick sinus syndrome Pacemaker with normal device function this admission Amonie Wisser enroll in our remote  monitoring and schedule outpatient followup The patient is not device dependent     3.  Recurrent DVT's Warfarin restarted Consider NOAC after work up done for possible vegetation  4. Tobacco abuse Cessation advised  We Niah Heinle follow  Signed, Gypsy Balsam, NP 02/21/2016 10:48 AM   I have seen and examined this patient with Gypsy Balsam.  Agree with above, note added to reflect my findings.  On exam, regular rhythm, no murmurs, lungs clear.  Patient initially presented with syncope, found to have vegetation on pacemaker lead.  No infectious symptoms such as fever, chills, sweats.  Leads have been in for 2-3 years per the patient.  At this point, unsure if infectious.  Saige Canton plan on TEE to further  characterize the vegetation.  Oma Alpert M. Lysle Yero MD 02/21/2016 4:38 PM

## 2016-02-22 NOTE — Progress Notes (Signed)
SUBJECTIVE: The patient is doing well today.  At this time, he denies chest pain, shortness of breath, or any new concerns.  CURRENT MEDICATIONS: . DULoxetine  30 mg Oral Daily  . enoxaparin (LOVENOX) injection  1 mg/kg Subcutaneous Q12H  . gabapentin  200 mg Oral TID  . insulin aspart  0-5 Units Subcutaneous QHS  . insulin aspart  0-9 Units Subcutaneous TID WC  . insulin aspart  4 Units Subcutaneous TID WC  . insulin glargine  20 Units Subcutaneous QHS  . sodium chloride flush  3 mL Intravenous Q12H  . Warfarin - Pharmacist Dosing Inpatient   Does not apply q1800      OBJECTIVE: Physical Exam: Filed Vitals:   02/22/16 0900 02/22/16 0910 02/22/16 0917 02/22/16 0928  BP: 118/84 135/83 129/87 122/86  Pulse: 59 59 60 59  Temp:    97.5 F (36.4 C)  TempSrc:    Oral  Resp: Height:      Weight:      SpO2: 98% 100%  100%    Intake/Output Summary (Last 24 hours) at 02/22/16 1015 Last data filed at 02/22/16 0617  Gross per 24 hour  Intake    480 ml  Output      0 ml  Net    480 ml    Telemetry reveals sinus rhythm, occasional atrial pacing   GEN- The patient is well appearing, alert and oriented x 3 today.   Head- normocephalic, atraumatic Eyes-  Sclera clear, conjunctiva pink Ears- hearing intact Oropharynx- clear Neck- supple  Lungs- Clear to ausculation bilaterally, normal work of breathing Heart- Regular rate and rhythm, no murmurs, rubs or gallops  GI- soft, NT, ND, + BS Extremities- no clubbing, cyanosis, or edema Skin- no rash or lesion Psych- euthymic mood, full affect Neuro- strength and sensation are intact  LABS: Basic Metabolic Panel:  Recent Labs  16/10/96 0821 02/21/16 0629  NA 137 135  K 3.8 3.7  CL 102 102  CO2 24 25  GLUCOSE 201* 151*  BUN <5* 7  CREATININE 0.74 0.83  CALCIUM 9.3 9.3  MG 2.0  --    Liver Function Tests:  Recent Labs  02/20/16 0821 02/21/16 0629  AST 99* 83*  ALT 74* 71*  ALKPHOS 235* 222*    BILITOT 0.8 0.5  PROT 6.7 6.3*  ALBUMIN 3.5 3.3*   CBC:  Recent Labs  02/21/16 0629 02/22/16 0442  WBC 4.2 4.5  HGB 13.3 13.6  HCT 36.6* 38.1*  MCV 92.7 94.1  PLT 113* 128*   Cardiac Enzymes:  Recent Labs  02/21/16 0629  CKTOTAL 77   BNP: Invalid input(s): POCBNP D-Dimer:  Recent Labs  02/19/16 1628  DDIMER 0.62*   ASSESSMENT AND PLAN:  Principal Problem:   Depression Active Problems:   DM type 2 with diabetic peripheral neuropathy (HCC)   Hypertension   Hypothyroidism   Atrial fibrillation (HCC)   AKI (acute kidney injury) (HCC)   Hypokalemia   Hyponatremia   Syncope   Elevated lactic acid level   Dehydration   Sinus node dysfunction (HCC)   Back pain   Transaminasemia   Pacemaker infection (HCC)   1. Possible vegetation on pacemaker lead TEE reviewed. Discussed with Dr Duke Salvia, differentials include vegetation vs thrombin.  Discussed with Dr Ladona Ridgel who felt that with negative blood cultures and no systemic infectious symptoms, there is no indication for extraction at this time.  Blondell Laperle obtain blood cultures and would recommend  ID consult  2. Sick sinus syndrome Pacemaker with normal device function this admission Gwynne Kemnitz enroll in our remote monitoring and schedule outpatient followup The patient is not device dependent   3. Recurrent DVT's Agree with changing Warfarin to Xarelto  4. Tobacco abuse Cessation advised  Ok to discharge from EP standpoint.   Gypsy Balsam, NP 02/22/2016 10:17 AM  I have seen and examined this patient with Gypsy Balsam.  Agree with above, note added to reflect my findings.  On exam, regular rhythm, no murmurs, lungs clear.  Was found to have a mass on his pacemaker lead on TTE.  Has a history of sick sinus syndrome.  Has not had fevers, chills, sweats, and has negative cultures to date.  WBC normal.  It is unlikely that this is due to infection.  This is further supported by the TEE showing likely fibrin stranding  on his lead.  At this time would continue to monitor for further signs of infection, can be done as an outpatient.  Agree with switching his anticoagulation to Xarelto.    Sherod Cisse M. Timothy Townsel MD 02/22/2016 2:00 PM

## 2016-02-22 NOTE — CV Procedure (Signed)
Brief TEE Note  Mr. Vroom tolerated moderate sedation.  He received  of versed and 50 mcg of fentanyl.  He was sedated for 25 minutes.    Findings: LVEF >55% Trivial mitral regurgitation There are 2 vegetations on the RV pacemaker lead. The RA lead does not appear to be affected.  Differential includes vegetation, thrombus and fibrin.   There were no complications and he tolerated the procedure well.  For further details see full report.  Dondrea Clendenin C. Duke Salvia, MD, [facility]  02/22/2016  8:46 AM

## 2016-02-22 NOTE — Progress Notes (Signed)
ANTICOAGULATION CONSULT NOTE  Pharmacy Consult for Xarelto Indication: History of DVT  Allergies  Allergen Reactions  . Penicillins Hives  . Bacitracin Itching, Swelling and Rash  . Septra [Sulfamethoxazole-Trimethoprim] Itching, Swelling and Rash    Patient Measurements: Height: 6' (182.9 cm) Weight: 176 lb 2.4 oz (79.9 kg) IBW/kg (Calculated) : 77.6  Vital Signs: Temp: 97.5 F (36.4 C) (02/24 0928) Temp Source: Oral (02/24 0928) BP: 122/86 mmHg (02/24 0928) Pulse Rate: 59 (02/24 0928)  Labs:  Recent Labs  02/20/16 0821 02/21/16 0629 02/22/16 0442  HGB 13.3 13.3 13.6  HCT 37.1* 36.6* 38.1*  PLT 114* 113* 128*  LABPROT 14.3 15.8* 17.4*  INR 1.09 1.25 1.42  CREATININE 0.74 0.83  --   CKTOTAL  --  77  --     Estimated Creatinine Clearance: 111.7 mL/min (by C-G formula based on Cr of 0.83).   Assessment: 55yo male with hx DVT in 2010 and 2013; he has been off Coumadin for 8 months.  Per patient, he was not told to stop taking the medication by anyone. Per past notes and patient's report - he is to be on life-long anticoagulation.  D-Dimer is elevated at 0.62, no VQ scan or dopplers completed yet.TEE shows vegetation vs thrombus vs thrombin on pacer wires. BCx are NGTD- not on antibiotics. Cannot tell if this is new thrombus.  Was on Lovenox + warfarin, but now to transition to Xarelto for history of DVT. Benefits check has been run and patient should have around a $4 co-pay. CM also to give a 30 day free card.  INR this morning 1.42, last dose of Lovenox given ~0500. Hgb 13.6, plts 128- relatively stable, no bleeding noted. SCr 0.83, CrCl >145mL/min  Goal of Therapy:  Monitor platelets by anticoagulation protocol: Yes   Plan:  -stop Lovenox and warfarin -start Xarelto  po qsupper tonight  -CBC q72h -will provide education prior to discharge  Fe Okubo D. Antoney Biven, PharmD, BCPS Clinical Pharmacist Pager: [phone] 02/22/2016 12:25 PM

## 2016-02-22 NOTE — Progress Notes (Signed)
  Echocardiogram Echocardiogram Transesophageal has been performed.  Patrick Small 02/22/2016, 9:07 AM

## 2016-02-23 LAB — CULTURE, BLOOD (ROUTINE X 2)
CULTURE: NO GROWTH
Culture: NO GROWTH

## 2016-02-25 ENCOUNTER — Encounter (HOSPITAL_COMMUNITY): Payer: Self-pay | Admitting: Cardiovascular Disease

## 2016-02-26 LAB — CULTURE, BLOOD (ROUTINE X 2)
CULTURE: NO GROWTH
Culture: NO GROWTH

## 2016-02-27 LAB — CULTURE, BLOOD (ROUTINE X 2)
Culture: NO GROWTH
Culture: NO GROWTH

## 2016-03-04 ENCOUNTER — Encounter: Payer: Self-pay | Admitting: Family Medicine

## 2016-03-04 ENCOUNTER — Encounter: Payer: Self-pay | Admitting: Clinical

## 2016-03-04 ENCOUNTER — Ambulatory Visit: Payer: Medicaid Other | Attending: Family Medicine | Admitting: Family Medicine

## 2016-03-04 VITALS — BP 141/86 | HR 67 | Temp 98.0°F | Resp 16 | Ht 73.0 in | Wt 182.0 lb

## 2016-03-04 DIAGNOSIS — Z950 Presence of cardiac pacemaker: Secondary | ICD-10-CM | POA: Insufficient documentation

## 2016-03-04 DIAGNOSIS — IMO0002 Reserved for concepts with insufficient information to code with codable children: Secondary | ICD-10-CM | POA: Insufficient documentation

## 2016-03-04 DIAGNOSIS — E1165 Type 2 diabetes mellitus with hyperglycemia: Secondary | ICD-10-CM | POA: Diagnosis not present

## 2016-03-04 DIAGNOSIS — G8929 Other chronic pain: Secondary | ICD-10-CM | POA: Insufficient documentation

## 2016-03-04 DIAGNOSIS — E1142 Type 2 diabetes mellitus with diabetic polyneuropathy: Secondary | ICD-10-CM | POA: Insufficient documentation

## 2016-03-04 DIAGNOSIS — R55 Syncope and collapse: Secondary | ICD-10-CM | POA: Diagnosis not present

## 2016-03-04 DIAGNOSIS — M549 Dorsalgia, unspecified: Secondary | ICD-10-CM | POA: Insufficient documentation

## 2016-03-04 DIAGNOSIS — Z794 Long term (current) use of insulin: Secondary | ICD-10-CM

## 2016-03-04 DIAGNOSIS — E118 Type 2 diabetes mellitus with unspecified complications: Secondary | ICD-10-CM

## 2016-03-04 DIAGNOSIS — Z79899 Other long term (current) drug therapy: Secondary | ICD-10-CM | POA: Insufficient documentation

## 2016-03-04 DIAGNOSIS — F17210 Nicotine dependence, cigarettes, uncomplicated: Secondary | ICD-10-CM | POA: Insufficient documentation

## 2016-03-04 DIAGNOSIS — M545 Low back pain, unspecified: Secondary | ICD-10-CM | POA: Insufficient documentation

## 2016-03-04 DIAGNOSIS — E11628 Type 2 diabetes mellitus with other skin complications: Secondary | ICD-10-CM

## 2016-03-04 DIAGNOSIS — Z7901 Long term (current) use of anticoagulants: Secondary | ICD-10-CM | POA: Diagnosis not present

## 2016-03-04 DIAGNOSIS — L84 Corns and callosities: Secondary | ICD-10-CM

## 2016-03-04 LAB — CBC
HEMATOCRIT: 39 % (ref 39.0–52.0)
HEMOGLOBIN: 13.3 g/dL (ref 13.0–17.0)
MCH: 32.8 pg (ref 26.0–34.0)
MCHC: 34.1 g/dL (ref 30.0–36.0)
MCV: 96.1 fL (ref 78.0–100.0)
MPV: 10.9 fL (ref 8.6–12.4)
Platelets: 179 10*3/uL (ref 150–400)
RBC: 4.06 MIL/uL — AB (ref 4.22–5.81)
RDW: 14.6 % (ref 11.5–15.5)
WBC: 6.6 10*3/uL (ref 4.0–10.5)

## 2016-03-04 LAB — COMPLETE METABOLIC PANEL WITH GFR
ALBUMIN: 4 g/dL (ref 3.6–5.1)
ALK PHOS: 288 U/L — AB (ref 40–115)
ALT: 52 U/L — ABNORMAL HIGH (ref 9–46)
AST: 76 U/L — ABNORMAL HIGH (ref 10–35)
BUN: 10 mg/dL (ref 7–25)
CALCIUM: 9.6 mg/dL (ref 8.6–10.3)
CHLORIDE: 99 mmol/L (ref 98–110)
CO2: 26 mmol/L (ref 20–31)
Creat: 0.79 mg/dL (ref 0.70–1.33)
GFR, Est African American: >89 mL/min (ref >=60)
Glucose, Bld: 239 mg/dL — ABNORMAL HIGH (ref 65–99)
POTASSIUM: 4.3 mmol/L (ref 3.5–5.3)
SODIUM: 134 mmol/L — AB (ref 135–146)
Total Bilirubin: 0.6 mg/dL (ref 0.2–1.2)
Total Protein: 7.2 g/dL (ref 6.1–8.1)

## 2016-03-04 LAB — GLUCOSE, POCT (MANUAL RESULT ENTRY)
POC Glucose: 275 mg/dl — AB (ref 70–99)
POC Glucose: 241 mg/dl — AB (ref 70–99)

## 2016-03-04 MED ORDER — EXENATIDE 5 MCG/0.02ML ~~LOC~~ SOPN: 5.0000 ug | PEN_INJECTOR | Freq: Two times a day (BID) | SUBCUTANEOUS | Status: DC

## 2016-03-04 MED ORDER — INSULIN ASPART 100 UNIT/ML CARTRIDGE (PENFILL)
5.0000 [IU] | Freq: Once | SUBCUTANEOUS | Status: AC
  Administered 2016-03-04: 5 [IU] via SUBCUTANEOUS

## 2016-03-04 MED ORDER — DIAZEPAM 5 MG PO TABS: 5.0000 mg | ORAL_TABLET | Freq: Two times a day (BID) | ORAL | Status: DC | PRN

## 2016-03-04 MED ORDER — METFORMIN HCL ER 500 MG PO TB24: 1000.0000 mg | ORAL_TABLET | Freq: Every day | ORAL | Status: DC

## 2016-03-04 NOTE — Assessment & Plan Note (Signed)
uncontrolled DM2 with hypoglycemia on novolog  D/c novolog Add byetta

## 2016-03-04 NOTE — Progress Notes (Signed)
Subjective:  Patient ID: Patrick Small, male    DOB: 07/31/1961  Age: 55 y.o. MRN: [id number]  CC: Hospitalization Follow-up and Back Pain   HPI Patrick Small presents for   1. Chronic back pain: since accident moving a hot tub in 2010.  worsening. Cannot sleep.  Pain is worsening. Self-medicating with booze. Both sides of the back. From neck down to low back. He also has diabetic neuropathy. He is "self-medicating" with booze. He was drinking 12 pack of of 16 oz can of beer of week prior to his hospitalization, then stopped the week before the hospitalization. He has not drank since his hospitalization. He described the biggest aspect of his pain as throbbing.   2. Hospitalization follow-up: he was hospitalized from 2/19-2/24 following syncope. Found to have vegetations on pacemaker. Negative blood and urine Cx. No CP or SOB. No fever or chills.    3. CHRONIC DIABETES  Disease Monitoring  Blood Sugar Ranges: 69-390,  The 69 was after taking 5 U of novolog for CBG 390 w/o food   Polyuria: no   Visual problems: yes   Medication Compliance: yes  Medication Side Effects  Hypoglycemia: yes    Social History  Substance Use Topics  . Smoking status: Current Every Day Smoker -- 6.00 packs/day    Types: Cigarettes  . Smokeless tobacco: Never Used  . Alcohol Use: Not on file   Past Surgical History  Procedure Laterality Date  . Pacemaker insertion  2014  . Tee without cardioversion N/A 02/22/2016    Procedure: TRANSESOPHAGEAL ECHOCARDIOGRAM (TEE);  Surgeon: Chilton Siiffany [place], MD;  [facility];  Service: Cardiovascular;  Laterality: N/A;   Outpatient Prescriptions Prior to Visit  Medication Sig Dispense Refill  . DULoxetine (CYMBALTA) 30 MG capsule Take 1 capsule (30 mg total) by mouth daily. 30 capsule 0  . gabapentin (NEURONTIN) 100 MG capsule Take 2 capsules (200 mg total) by mouth 3 (three) times daily. 120 capsule 0  . insulin aspart (NOVOLOG) 100 UNIT/ML FlexPen  Inject 0-9 Units into the skin 3 (three) times daily with meals. CBG < 70: Eat or drink something and recheck, CBG 70 - 120: 0 units CBG 121 - 150: 0 units CBG 151 - 200: 0 units CBG 201 - 250: 2 units CBG 251 - 300: 3 units CBG 301 - 350: 4 units CBG 351 - 400: 5 units CBG > 400: call MD. 15 mL 0  . Insulin Glargine (LANTUS SOLOSTAR) 100 UNIT/ML Solostar Pen Inject 20 Units into the skin daily at 10 pm. 15 mL 0  . rivaroxaban (XARELTO) 20 MG TABS tablet Take 1 tablet (20 mg total) by mouth daily with supper. 30 tablet 0  . acetaminophen (TYLENOL) 325 MG tablet Take 2 tablets (650 mg total) by mouth every 6 (six) hours as needed for mild pain, moderate pain, fever or headache. (Patient not taking: Reported on 03/04/2016)     No facility-administered medications prior to visit.    ROS Review of Systems  Constitutional: Negative for fever, chills, fatigue and unexpected weight change.  Eyes: Positive for visual disturbance.  Respiratory: Negative for cough and shortness of breath.   Cardiovascular: Negative for chest pain, palpitations and leg swelling.  Gastrointestinal: Negative for nausea, vomiting, abdominal pain, diarrhea, constipation and blood in stool.  Endocrine: Negative for polydipsia, polyphagia and polyuria.  Musculoskeletal: Positive for back pain. Negative for myalgias, arthralgias, gait problem and neck pain.  Skin: Negative for rash.  Allergic/Immunologic: Negative for immunocompromised state.  Neurological: Positive for numbness.  Hematological: Negative for adenopathy. Does not bruise/bleed easily.  Psychiatric/Behavioral: Negative for suicidal ideas, sleep disturbance and dysphoric mood. The patient is not nervous/anxious.     Objective:  BP 141/86 mmHg  Pulse 67  Temp(Src) 98 F (36.7 C) (Oral)  Resp 16  Ht  (1.854 m)  Wt 182 lb (82.555 kg)  BMI 24.02 kg/m2  SpO2 100%  BP/Weight 03/04/2016 02/22/2016 11/16/2014  Systolic BP 141 122 152  Diastolic BP 86  86 92  Wt. (Lbs) 182 176.15 208  BMI 24.02 23.88 28.2   Physical Exam  Constitutional: He appears well-developed and well-nourished. No distress.  HENT:  Head: Normocephalic and atraumatic.  Eyes: Conjunctivae and EOM are normal. Pupils are equal, round, and reactive to light.  Neck: Normal range of motion. Neck supple.  Cardiovascular: Normal rate, regular rhythm, normal heart sounds and intact distal pulses.   Pulmonary/Chest: Effort normal and breath sounds normal.  Musculoskeletal: He exhibits no edema.  Neurological: He is alert.  Skin: Skin is warm and dry. No rash noted. No erythema.  Psychiatric: He has a normal mood and affect.   Lab Results  Component Value Date   HGBA1C 12.1* 02/18/2016   CBG 275  Treated with 5 U of novolog   Repeat CBG 241  Assessment & Plan:   Patrick Small was seen today for hospitalization follow-up and back pain.  Diagnoses and all orders for this visit:  Uncontrolled type 2 diabetes mellitus with complication, with long-term current use of insulin (HCC) -     Glucose (CBG) -     CBC -     COMPLETE METABOLIC PANEL WITH GFR -     Microalbumin/Creatinine Ratio, Urine -     insulin aspart (NOVOLOG) cartridge 5 Units; Inject 0.05 mLs (5 Units total) into the skin once. -     Cancel: Ambulatory referral to Endocrinology -     POCT glucose (manual entry) -     metFORMIN (GLUCOPHAGE XR) 500 MG 24 hr tablet; Take 2 tablets (1,000 mg total) by mouth daily with breakfast. -     exenatide (BYETTA 5 MCG PEN) 5 MCG/0.02ML SOPN injection; Inject 0.02 mLs (5 mcg total) into the skin 2 (two) times daily with a meal. -     Ambulatory referral to Ophthalmology -     Ambulatory referral to Podiatry -     Glucose (CBG)  Chronic low back pain -     diazepam (VALIUM) 5 MG tablet; Take 1 tablet (5 mg total) by mouth every 12 (twelve) hours as needed for anxiety. -     Ambulatory referral to Pain Clinic  Diabetic peripheral neuropathy associated with type 2  diabetes mellitus (HCC) -     diazepam (VALIUM) 5 MG tablet; Take 1 tablet (5 mg total) by mouth every 12 (twelve) hours as needed for anxiety.  Type 2 diabetes mellitus with pressure callus (HCC) -     Ambulatory referral to Podiatry     No orders of the defined types were placed in this encounter.    Follow-up: No Follow-up on file.   Dessa Phi MD

## 2016-03-04 NOTE — Progress Notes (Signed)
[facility] 03/04/2016 09/25/2014  Decreased Interest 1 2  Down, Depressed, Hopeless 2 3  PHQ - 2 Score 3 5  Altered sleeping 3 3  Tired, decreased energy 1 1  Change in appetite 1 2  Feeling bad or failure about yourself  3 3  Trouble concentrating 2 2  Moving slowly or fidgety/restless 1 1  Suicidal thoughts 0 0  PHQ-9 Score 14 17    GAD 7 : Generalized Anxiety Score 03/04/2016  Nervous, Anxious, on Edge 1  Control/stop worrying 3  Worry too much - different things 3  Trouble relaxing 2  Restless 2  Easily annoyed or irritable 2  Afraid - awful might happen 3  Total GAD 7 Score 16

## 2016-03-04 NOTE — Progress Notes (Signed)
HFU low BP,  back pain Pain scale #  8 Tobacco user 1 pack per 3 days  No suicidal thoughts in the past two weeks  Elevated glucose today, stated glucose been running 200-400

## 2016-03-04 NOTE — Patient Instructions (Addendum)
Patrick Small was seen today for hospitalization follow-up and back pain.  Diagnoses and all orders for this visit:  Uncontrolled type 2 diabetes mellitus with complication, with long-term current use of insulin (HCC) -     Glucose (CBG) -     CBC -     COMPLETE METABOLIC PANEL WITH GFR -     Microalbumin/Creatinine Ratio, Urine -     insulin aspart (NOVOLOG) cartridge 5 Units; Inject 0.05 mLs (5 Units total) into the skin once. -     Cancel: Ambulatory referral to Endocrinology -     POCT glucose (manual entry) -     metFORMIN (GLUCOPHAGE XR) 500 MG 24 hr tablet; Take 2 tablets (1,000 mg total) by mouth daily with breakfast. -     exenatide (BYETTA 5 MCG PEN) 5 MCG/0.02ML SOPN injection; Inject 0.02 mLs (5 mcg total) into the skin 2 (two) times daily with a meal. -     Ambulatory referral to Ophthalmology -     Ambulatory referral to Podiatry -     Glucose (CBG)  Chronic low back pain -     diazepam (VALIUM) 5 MG tablet; Take 1 tablet (5 mg total) by mouth every 12 (twelve) hours as needed for anxiety. -     Ambulatory referral to Pain Clinic  Diabetic peripheral neuropathy associated with type 2 diabetes mellitus (HCC) -     diazepam (VALIUM) 5 MG tablet; Take 1 tablet (5 mg total) by mouth every 12 (twelve) hours as needed for anxiety.  Type 2 diabetes mellitus with pressure callus (HCC) -     Ambulatory referral to Podiatry   STOP novolog  F/u in 4 weeks for diabetes and chronic low back pain  Dr. Armen PickupFunches

## 2016-03-04 NOTE — Assessment & Plan Note (Signed)
A: chronic low back pain P: Pain management Valium

## 2016-03-05 LAB — MICROALBUMIN / CREATININE URINE RATIO
CREATININE, URINE: 73 mg/dL (ref 20–370)
MICROALB/CREAT RATIO: 22 ug/mg{creat} (ref <30)
Microalb, Ur: 1.6 mg/dL

## 2016-03-06 ENCOUNTER — Ambulatory Visit (INDEPENDENT_AMBULATORY_CARE_PROVIDER_SITE_OTHER): Payer: Medicaid Other | Admitting: Podiatry

## 2016-03-06 ENCOUNTER — Encounter: Payer: Self-pay | Admitting: Podiatry

## 2016-03-06 VITALS — BP 164/69 | HR 64 | Resp 14

## 2016-03-06 DIAGNOSIS — E1149 Type 2 diabetes mellitus with other diabetic neurological complication: Secondary | ICD-10-CM

## 2016-03-06 DIAGNOSIS — Q828 Other specified congenital malformations of skin: Secondary | ICD-10-CM

## 2016-03-06 NOTE — Progress Notes (Signed)
   Subjective:    Patient ID: Patrick Small, male    DOB: 01/16/1961, 55 y.o.   MRN: [id number]  HPI this diabetic patient presents to the office with chief complaint of a painful callus on the back of his right heel states that the pain in the heel has been present for years. He states he had an episode of stepping on a foreign which she said he proceeded to cut out. Since that time, a callus has regrown at that site. He states it causes pain and discomfort as he walks and wears his shoes. He has treated himself with Sharman CheekBreitman, but presents the office for an evaluation of this lesion. He also says that he has diabetes and has neuropathy for which she takes gabapentin. He says his blood sugars are up and down. He presents the office today for an evaluation of his feet   The patient presents here today with left foot (heel) pain after stepping on a thorn several years ago.he pulled it out but is still having problems. He is also a diabetic and is requesting B/l feet exam.  Review of Systems  All other systems reviewed and are negative.      Objective:   Physical Exam GENERAL APPEARANCE: Alert, conversant. Appropriately groomed. No acute distress.  VASCULAR: [facility]  [facility].  Capillary refill time is immediate to all digits,  Normal temperature gradient.   NEUROLOGIC: sensation is diminished  to 5.07 monofilament at 5/5 sites bilateral.  Light touch is intact bilateral, Muscle strength normal.  MUSCULOSKELETAL: acceptable muscle strength, tone and stability bilateral.  Intrinsic muscluature intact bilateral.  Rectus appearance of foot and digits noted bilateral. HAV 1st MPJ B/L  DERMATOLOGIC: skin color, texture, and turgor are within normal limits.  No preulcerative lesions or ulcers  are seen, no interdigital maceration noted.  No open lesions present.  Digital nails are asymptomatic. No drainage noted. Porokeratotic lesion right heel.  No evidence of thorn noted or  FB.         Assessment & Plan:  Porokeratosis  Left foot  IE  Debridement of porokeratotic lesion.  [facility] shoes per [facility].   Helane GuntherGregory Mayer DPM

## 2016-03-17 ENCOUNTER — Other Ambulatory Visit: Payer: Self-pay | Admitting: Family Medicine

## 2016-03-17 NOTE — Telephone Encounter (Signed)
Pt sister requesting BP medication refills  [facility] [address]

## 2016-03-20 ENCOUNTER — Telehealth: Payer: Self-pay | Admitting: Family Medicine

## 2016-03-20 DIAGNOSIS — I10 Essential (primary) hypertension: Secondary | ICD-10-CM

## 2016-03-20 NOTE — Telephone Encounter (Signed)
Called patient Spoke to his sister and Wynona NeatHCPOA Tangela Zobrist He was on losartan and norvasc before last hospitalization Now on no BP meds Check BP Range reported is 180-320 SBP  Advised patient to come in first thing Monday for BP check to verify BP as this range is severely high and unlikely   Patient will be scheduled as a double book on Monday AM at 9 AM.  Patient sister agreed

## 2016-03-20 NOTE — Telephone Encounter (Signed)
Pt. Called stating that he was taking off his BP medication because it was dropping his BP low and now that he is not taking the BP medications his BP is high. Please f/u with pt.

## 2016-03-24 ENCOUNTER — Ambulatory Visit: Payer: Medicaid Other | Attending: Family Medicine | Admitting: Family Medicine

## 2016-03-24 ENCOUNTER — Encounter: Payer: Self-pay | Admitting: Family Medicine

## 2016-03-24 VITALS — BP 118/79 | HR 71 | Temp 97.5°F | Resp 18 | Ht 73.0 in | Wt 175.0 lb

## 2016-03-24 DIAGNOSIS — IMO0002 Reserved for concepts with insufficient information to code with codable children: Secondary | ICD-10-CM

## 2016-03-24 DIAGNOSIS — E118 Type 2 diabetes mellitus with unspecified complications: Secondary | ICD-10-CM | POA: Diagnosis not present

## 2016-03-24 DIAGNOSIS — I10 Essential (primary) hypertension: Secondary | ICD-10-CM

## 2016-03-24 DIAGNOSIS — E1165 Type 2 diabetes mellitus with hyperglycemia: Secondary | ICD-10-CM | POA: Diagnosis not present

## 2016-03-24 DIAGNOSIS — Z794 Long term (current) use of insulin: Secondary | ICD-10-CM | POA: Diagnosis not present

## 2016-03-24 LAB — GLUCOSE, POCT (MANUAL RESULT ENTRY): POC GLUCOSE: 273 mg/dL — AB (ref 70–99)

## 2016-03-24 MED ORDER — EXENATIDE 5 MCG/0.02ML ~~LOC~~ SOPN: 10.0000 ug | PEN_INJECTOR | Freq: Two times a day (BID) | SUBCUTANEOUS | Status: DC

## 2016-03-24 MED ORDER — SITAGLIPTIN PHOSPHATE 100 MG PO TABS: 100.0000 mg | ORAL_TABLET | Freq: Every day | ORAL | Status: DC

## 2016-03-24 MED ORDER — INSULIN GLARGINE 100 UNIT/ML SOLOSTAR PEN: 30.0000 [IU] | PEN_INJECTOR | Freq: Every day | SUBCUTANEOUS | Status: DC

## 2016-03-24 MED ORDER — EXENATIDE 10 MCG/0.04ML ~~LOC~~ SOPN: 10.0000 ug | PEN_INJECTOR | Freq: Two times a day (BID) | SUBCUTANEOUS | Status: DC

## 2016-03-24 MED ORDER — METFORMIN HCL ER 500 MG PO TB24: 1000.0000 mg | ORAL_TABLET | Freq: Two times a day (BID) | ORAL | Status: DC

## 2016-03-24 NOTE — Assessment & Plan Note (Signed)
A: BP is normal in office. Was low during last hospitalization P: No meds Patient to bring home meter to next OV Home health order

## 2016-03-24 NOTE — Addendum Note (Signed)
Addended by: Dessa PhiFUNCHES, Shy Guallpa on: 03/24/2016 10:31 AM   Modules accepted: Orders

## 2016-03-24 NOTE — Progress Notes (Signed)
Patient is here for BP FU  Patient complains of lower back pain scaled currently at a 10.  Patient took his medications this morning. Patient has not eaten  273

## 2016-03-24 NOTE — Assessment & Plan Note (Signed)
A: diabetes uncontrolled P: Increase byetta to 10 mcg BID Increase lantus to 30 U q HS Add januvia 100 mg daily

## 2016-03-24 NOTE — Patient Instructions (Addendum)
Patrick Small was seen today for follow-up.  Diagnoses and all orders for this visit:  Uncontrolled type 2 diabetes mellitus with complication, with long-term current use of insulin (HCC) -     Glucose (CBG) -     Insulin Glargine (LANTUS SOLOSTAR) 100 UNIT/ML Solostar Pen; Inject 30 Units into the skin daily at 10 pm. -     Discontinue: metFORMIN (GLUCOPHAGE XR) 500 MG 24 hr tablet; Take 2 tablets (1,000 mg total) by mouth 2 (two) times daily after a meal. -     sitaGLIPtin (JANUVIA) 100 MG tablet; Take 1 tablet (100 mg total) by mouth daily. -     exenatide (BYETTA 5 MCG PEN) 5 MCG/0.02ML SOPN injection; Inject 0.04 mLs (10 mcg total) into the skin 2 (two) times daily with a meal.   [facility] [place]  Address: [address], [place], [place] [address]  Phone: [phone]   Your blood pressure is perfect  Sugar is running high Add januvia 100 mg daily  Increase byetta to 10 mcg twice daily   Diabetes blood sugar goals  Fasting (in AM before breakfast, 8 hrs of no eating or drinking (except water or unsweetened coffee or tea): 90-110 2 hrs after meals: < 160,   No low sugars: nothing < 70   F/u in 2 months for diabetes  Dr. Armen PickupFunches

## 2016-03-24 NOTE — Progress Notes (Signed)
Subjective:  Patient ID: Patrick Small, male    DOB: March 10, 1961  Age: 55 y.o. MRN: [id number]  CC: Follow-up   HPI Zander Seipp presents for interval f/u for report of elevated BP at home    1. CHRONIC HYPERTENSION: no antihypertensive. Sister is checking BP at home and readings are very high. Did not bring home meter. No HA, CP, SOB or swelling.    2. CHRONIC DIABETES: taking lantus and byetta. No metformin. Sugars are often > 200. None > 300 lately. No  vision changes since last OV. Has neuropathy in feet. Has seen podiatry. Awaiting appt with [facility] for diabetic shoes.   3. Home health: requesting personal care services at home. Comes today with form.   Social History  Substance Use Topics  . Smoking status: Current Every Day Smoker -- 6.00 packs/day    Types: Cigarettes  . Smokeless tobacco: Never Used  . Alcohol Use: 0.0 oz/week    0 Standard drinks or equivalent per week     Comment: only on weekends    Outpatient Prescriptions Prior to Visit  Medication Sig Dispense Refill  . diazepam (VALIUM) 5 MG tablet Take 1 tablet (5 mg total) by mouth every 12 (twelve) hours as needed for anxiety. 30 tablet 1  . DULoxetine (CYMBALTA) 30 MG capsule Take 1 capsule (30 mg total) by mouth daily. 30 capsule 0  . exenatide (BYETTA 5 MCG PEN) 5 MCG/0.02ML SOPN injection Inject 0.02 mLs (5 mcg total) into the skin 2 (two) times daily with a meal. 1.2 mL 5  . gabapentin (NEURONTIN) 100 MG capsule Take 2 capsules (200 mg total) by mouth 3 (three) times daily. 120 capsule 0  . Insulin Glargine (LANTUS SOLOSTAR) 100 UNIT/ML Solostar Pen Inject 20 Units into the skin daily at 10 pm. 15 mL 0  . metFORMIN (GLUCOPHAGE XR) 500 MG 24 hr tablet Take 2 tablets (1,000 mg total) by mouth daily with breakfast. 60 tablet 5  . rivaroxaban (XARELTO) 20 MG TABS tablet Take 1 tablet (20 mg total) by mouth daily with supper. 30 tablet 0  . acetaminophen (TYLENOL) 325 MG tablet Take 2 tablets (650  mg total) by mouth every 6 (six) hours as needed for mild pain, moderate pain, fever or headache. (Patient not taking: Reported on 03/24/2016)     No facility-administered medications prior to visit.    ROS Review of Systems  Constitutional: Negative for fever, chills, fatigue and unexpected weight change.  Eyes: Negative for visual disturbance.  Respiratory: Negative for cough and shortness of breath.   Cardiovascular: Negative for chest pain, palpitations and leg swelling.  Gastrointestinal: Negative for nausea, vomiting, abdominal pain, diarrhea, constipation and blood in stool.  Endocrine: Negative for polydipsia, polyphagia and polyuria.  Musculoskeletal: Negative for myalgias, back pain, arthralgias, gait problem and neck pain.  Skin: Negative for rash.  Allergic/Immunologic: Negative for immunocompromised state.  Neurological: Positive for numbness.  Hematological: Negative for adenopathy. Does not bruise/bleed easily.  Psychiatric/Behavioral: Negative for suicidal ideas, sleep disturbance and dysphoric mood. The patient is not nervous/anxious.     Objective:  BP 118/79 mmHg  Pulse 71  Temp(Src) 97.5 F (36.4 C) (Oral)  Resp 18  Ht  (1.854 m)  Wt 175 lb (79.379 kg)  BMI 23.09 kg/m2  SpO2 97%  BP/Weight 03/24/2016 03/06/2016 03/04/2016  Systolic BP 118 164 141  Diastolic BP 79 69 86  Wt. (Lbs) 175 - 182  BMI 23.09 - 24.02    Physical Exam  Constitutional: He appears well-developed and well-nourished. No distress.  HENT:  Head: Normocephalic and atraumatic.  Neck: Normal range of motion. Neck supple.  Cardiovascular: Normal rate, regular rhythm, normal heart sounds and intact distal pulses.   Pulmonary/Chest: Effort normal and breath sounds normal.  Musculoskeletal: He exhibits no edema.  Neurological: He is alert.  Skin: Skin is warm and dry. No rash noted. No erythema.  Psychiatric: He has a normal mood and affect.   Lab Results  Component Value Date    HGBA1C 12.1* 02/18/2016   CBG 270  Assessment & Plan:   Jakyrie was seen today for follow-up.  Diagnoses and all orders for this visit:  Uncontrolled type 2 diabetes mellitus with complication, with long-term current use of insulin (HCC) -     Glucose (CBG) -     Insulin Glargine (LANTUS SOLOSTAR) 100 UNIT/ML Solostar Pen; Inject 30 Units into the skin daily at 10 pm. -     Discontinue: metFORMIN (GLUCOPHAGE XR) 500 MG 24 hr tablet; Take 2 tablets (1,000 mg total) by mouth 2 (two) times daily after a meal. -     sitaGLIPtin (JANUVIA) 100 MG tablet; Take 1 tablet (100 mg total) by mouth daily. -     Discontinue: exenatide (BYETTA 5 MCG PEN) 5 MCG/0.02ML SOPN injection; Inject 0.04 mLs (10 mcg total) into the skin 2 (two) times daily with a meal. -     exenatide (BYETTA) 10 MCG/0.04ML SOPN injection; Inject 0.04 mLs (10 mcg total) into the skin 2 (two) times daily with a meal.                              No orders of the defined types were placed in this encounter.    Follow-up: No Follow-up on file.   Dessa PhiJosalyn Kaushal Vannice MD

## 2016-04-01 ENCOUNTER — Ambulatory Visit: Payer: Self-pay | Admitting: Family Medicine

## 2016-04-02 ENCOUNTER — Encounter: Payer: Self-pay | Admitting: Podiatry

## 2016-04-02 ENCOUNTER — Ambulatory Visit (INDEPENDENT_AMBULATORY_CARE_PROVIDER_SITE_OTHER): Payer: Medicaid Other | Admitting: Podiatry

## 2016-04-02 VITALS — BP 152/111 | HR 82 | Resp 14

## 2016-04-02 DIAGNOSIS — Q828 Other specified congenital malformations of skin: Secondary | ICD-10-CM

## 2016-04-02 DIAGNOSIS — E1149 Type 2 diabetes mellitus with other diabetic neurological complication: Secondary | ICD-10-CM

## 2016-04-02 MED ORDER — GABAPENTIN 100 MG PO CAPS: 100.0000 mg | ORAL_CAPSULE | Freq: Three times a day (TID) | ORAL | Status: DC

## 2016-04-02 MED ORDER — GABAPENTIN 100 MG PO CAPS: 200.0000 mg | ORAL_CAPSULE | Freq: Three times a day (TID) | ORAL | Status: DC

## 2016-04-02 NOTE — Progress Notes (Signed)
Subjective:     Patient ID: Penni BombardDarryl Lundstrom, male   DOB: 06/04/1961, 55 y.o.   MRN: [id number]  HPI this patient presents to the office for an evaluation of his right foot. He gives a history of having stepped on a thorn and that has grown into this right foot area is the heel of the right foot. He presents to my office on 03/06/2016 with the same complaint. He says he does not remember coming into the office or having any treatment provided by myself for this condition. He also relates that he still having pain in all 10 of his toes, which is related to his diabetic neuropathy. He presents the office for an evaluation and treatment of these 2 conditions   Review of Systems     Objective:   Physical Exam Physical Exam GENERAL APPEARANCE: Alert, conversant. Appropriately groomed. No acute distress.  VASCULAR: [facility]al [facility]. Capillary refill time is immediate to all digits, Normal temperature gradient.  NEUROLOGIC: sensation is diminished to 5.07 monofilament at 5/5 sites bilateral. Light touch is intact bilateral, Muscle strength normal.  MUSCULOSKELETAL: acceptable muscle strength, tone and stability bilateral. Intrinsic muscluature intact bilateral. Rectus appearance of foot and digits noted bilateral. HAV 1st MPJ B/L  DERMATOLOGIC: skin color, texture, and turgor are within normal limits. No preulcerative lesions or ulcers are seen, no interdigital maceration noted. No open lesions present. Digital nails are asymptomatic. No drainage noted. Porokeratotic lesion right heel. No evidence of thorn noted or FB     Assessment:     Porokeratosis right heel.  Diabetic neuropathy    Plan:     ROV  Debride porokeratosis right heel.  Discussed Peripheral neuropathy with this patient. He had previously been given 120 tablets of gabapentin 100 mg by his medical doctor. He was told to take 2 tablets 3 times a day for the neuropathy. He took all the medication  and has none left. He told me that the gabapentin made no difference. I told the patient. I was uncomfortable ordering gabapentin and that he needs to talk to his medical doctor about refill in the meantime, I did proceed to order gabapentin 100 mg #60 to be taken to tablets 3 times a day until he could get in to talk with his doctor.. I told him I have no other treatment for this condition. Therefore, he needs to follow-up with his medical doctor.   Helane GuntherGregory Glenroy Crossen DPM

## 2016-04-02 NOTE — Addendum Note (Signed)
Addended by: Harlon FlorSOUTHERLAND, Jahdai Padovano L on: 04/02/2016 04:53 PM   Modules accepted: Orders

## 2016-04-09 ENCOUNTER — Telehealth: Payer: Self-pay | Admitting: Family Medicine

## 2016-04-09 NOTE — Telephone Encounter (Signed)
Patient is requesting for the prescription Diabetic shoes be sent to Dr. Raiford Nobleick Tuckett-Cped.Marland Kitchen.Marland Kitchen.Marland Kitchen.Marland Kitchen.please fax to [phone].Marland Kitchen.Marland Kitchen.Marland Kitchen.Marland Kitchen.please follow up with patient... He will be seen next 4/19 by this provider

## 2016-04-15 NOTE — Telephone Encounter (Signed)
Rx for diabetic shoes written Please fax

## 2016-04-15 NOTE — Telephone Encounter (Signed)
Rx faxed

## 2016-04-29 ENCOUNTER — Encounter: Payer: Self-pay | Admitting: Family Medicine

## 2016-04-29 ENCOUNTER — Ambulatory Visit: Payer: Medicaid Other | Attending: Family Medicine | Admitting: Family Medicine

## 2016-04-29 VITALS — BP 113/72 | HR 64 | Temp 98.0°F | Resp 16 | Ht 73.0 in | Wt 183.0 lb

## 2016-04-29 DIAGNOSIS — M25532 Pain in left wrist: Secondary | ICD-10-CM | POA: Diagnosis not present

## 2016-04-29 DIAGNOSIS — E118 Type 2 diabetes mellitus with unspecified complications: Secondary | ICD-10-CM | POA: Diagnosis not present

## 2016-04-29 DIAGNOSIS — E1165 Type 2 diabetes mellitus with hyperglycemia: Secondary | ICD-10-CM

## 2016-04-29 DIAGNOSIS — G4700 Insomnia, unspecified: Secondary | ICD-10-CM | POA: Diagnosis not present

## 2016-04-29 DIAGNOSIS — Z79899 Other long term (current) drug therapy: Secondary | ICD-10-CM | POA: Insufficient documentation

## 2016-04-29 DIAGNOSIS — M79642 Pain in left hand: Secondary | ICD-10-CM | POA: Insufficient documentation

## 2016-04-29 DIAGNOSIS — F17210 Nicotine dependence, cigarettes, uncomplicated: Secondary | ICD-10-CM | POA: Insufficient documentation

## 2016-04-29 DIAGNOSIS — F329 Major depressive disorder, single episode, unspecified: Secondary | ICD-10-CM

## 2016-04-29 DIAGNOSIS — Z7901 Long term (current) use of anticoagulants: Secondary | ICD-10-CM | POA: Diagnosis not present

## 2016-04-29 DIAGNOSIS — Z794 Long term (current) use of insulin: Secondary | ICD-10-CM | POA: Diagnosis not present

## 2016-04-29 DIAGNOSIS — IMO0002 Reserved for concepts with insufficient information to code with codable children: Secondary | ICD-10-CM

## 2016-04-29 DIAGNOSIS — F32A Depression, unspecified: Secondary | ICD-10-CM

## 2016-04-29 DIAGNOSIS — Z86718 Personal history of other venous thrombosis and embolism: Secondary | ICD-10-CM | POA: Diagnosis not present

## 2016-04-29 LAB — GLUCOSE, POCT (MANUAL RESULT ENTRY): POC Glucose: 189 mg/dl — AB (ref 70–99)

## 2016-04-29 LAB — URIC ACID: Uric Acid, Serum: 5.6 mg/dL (ref 4.0–7.8)

## 2016-04-29 MED ORDER — ZOLPIDEM TARTRATE 10 MG PO TABS: 10.0000 mg | ORAL_TABLET | Freq: Every evening | ORAL | Status: DC | PRN

## 2016-04-29 MED ORDER — RIVAROXABAN 20 MG PO TABS: 20.0000 mg | ORAL_TABLET | Freq: Every day | ORAL | Status: DC

## 2016-04-29 MED ORDER — DULOXETINE HCL 30 MG PO CPEP: 30.0000 mg | ORAL_CAPSULE | Freq: Every day | ORAL | Status: DC

## 2016-04-29 MED ORDER — SITAGLIPTIN PHOSPHATE 100 MG PO TABS: 100.0000 mg | ORAL_TABLET | Freq: Every day | ORAL | Status: DC

## 2016-04-29 MED ORDER — GABAPENTIN 100 MG PO CAPS: 100.0000 mg | ORAL_CAPSULE | Freq: Three times a day (TID) | ORAL | Status: DC

## 2016-04-29 MED ORDER — EXENATIDE 10 MCG/0.04ML ~~LOC~~ SOPN: 10.0000 ug | PEN_INJECTOR | Freq: Two times a day (BID) | SUBCUTANEOUS | Status: DC

## 2016-04-29 MED ORDER — INSULIN GLARGINE 100 UNIT/ML SOLOSTAR PEN: 30.0000 [IU] | PEN_INJECTOR | Freq: Every day | SUBCUTANEOUS | Status: DC

## 2016-04-29 MED ORDER — DULOXETINE HCL 30 MG PO CPEP
30.0000 mg | ORAL_CAPSULE | Freq: Every day | ORAL | Status: DC
Start: 2016-04-29 — End: 2016-10-02

## 2016-04-29 NOTE — Progress Notes (Signed)
Requesting PT referral  Hand swelling and pain X 4 weeks. Stated had an injury last month  Pt stated not taking medication, Only taken Lantus  Do not want to be have glucose check today  Tobacco user 1pp 3 days  Pain scale #8 No suicidal thought in the past two weeks

## 2016-04-29 NOTE — Progress Notes (Signed)
Subjective:  Patient ID: Patrick Small, male    DOB: 09/17/1961  Age: 55 y.o. MRN: [id number]  CC: Referral and Hand Pain   HPI Patrick Small presents for    1. Med refills: requesting refills of meds. Reports not taking any medication except lantus   2. L hand pain: x 2 months. He reportedly fell while in the hospital. He states he informed me of this at his last OV but there is no record of that. He has swelling of his hand. No redness. There is tenderness. He request referral for hand PT.   3. Agitation: patient agitated during his OV. Standing in hall. Talking loudly on his cell phone. When I asked him to return to his room and close the door to finish his conversation while he waits. He states that he cannot have the door closed and is upset to wait. He denies ETOH use.    4. Insomnia: has trouble sleeping. Has hx of sleep apnea but lost his CPAP in the move from [place] to [place]. Snores.   Social History  Substance Use Topics  . Smoking status: Current Every Day Smoker -- 6.00 packs/day    Types: Cigarettes  . Smokeless tobacco: Never Used  . Alcohol Use: 0.0 oz/week    0 Standard drinks or equivalent per week     Comment: only on weekends    Outpatient Prescriptions Prior to Visit  Medication Sig Dispense Refill  . Insulin Glargine (LANTUS SOLOSTAR) 100 UNIT/ML Solostar Pen Inject 30 Units into the skin daily at 10 pm. 15 mL 5  . diazepam (VALIUM) 5 MG tablet Take 1 tablet (5 mg total) by mouth every 12 (twelve) hours as needed for anxiety. (Patient not taking: Reported on 04/29/2016) 30 tablet 1  . DULoxetine (CYMBALTA) 30 MG capsule Take 1 capsule (30 mg total) by mouth daily. (Patient not taking: Reported on 04/29/2016) 30 capsule 0  . exenatide (BYETTA) 10 MCG/0.04ML SOPN injection Inject 0.04 mLs (10 mcg total) into the skin 2 (two) times daily with a meal. (Patient not taking: Reported on 04/29/2016) 2.4 mL 5  . gabapentin (NEURONTIN) 100 MG capsule Take 1  capsule (100 mg total) by mouth 3 (three) times daily. (Patient not taking: Reported on 04/29/2016) 10 capsule 0  . rivaroxaban (XARELTO) 20 MG TABS tablet Take 1 tablet (20 mg total) by mouth daily with supper. (Patient not taking: Reported on 04/29/2016) 30 tablet 0  . sitaGLIPtin (JANUVIA) 100 MG tablet Take 1 tablet (100 mg total) by mouth daily. (Patient not taking: Reported on 04/29/2016) 30 tablet 11   No facility-administered medications prior to visit.    ROS Review of Systems  Constitutional: Negative for fever, chills, fatigue and unexpected weight change.  Eyes: Negative for visual disturbance.  Respiratory: Negative for cough and shortness of breath.   Cardiovascular: Negative for chest pain, palpitations and leg swelling.  Gastrointestinal: Negative for nausea, vomiting, abdominal pain, diarrhea, constipation and blood in stool.  Endocrine: Negative for polydipsia, polyphagia and polyuria.  Musculoskeletal: Positive for joint swelling and arthralgias. Negative for myalgias, back pain, gait problem and neck pain.  Skin: Negative for rash.  Allergic/Immunologic: Negative for immunocompromised state.  Hematological: Negative for adenopathy. Does not bruise/bleed easily.  Psychiatric/Behavioral: Positive for sleep disturbance. Negative for suicidal ideas and dysphoric mood. The patient is not nervous/anxious.     Objective:  BP 113/72 mmHg  Pulse 64  Temp(Src) 98 F (36.7 C) (Oral)  Resp 16  Ht 6\' 1"  (1.854  m)  Wt 183 lb (83.008 kg)  BMI 24.15 kg/m2  SpO2 98%  BP/Weight 04/29/2016 04/02/2016 03/24/2016  Systolic BP 113 152 118  Diastolic BP 72 111 79  Wt. (Lbs) 183 - 175  BMI 24.15 - 23.09    Physical Exam  Musculoskeletal:       Right wrist: He exhibits decreased range of motion, tenderness and swelling. He exhibits no effusion, no deformity and no laceration.       Arms:      Left hand: He exhibits decreased range of motion and swelling. He exhibits normal capillary  refill. Normal sensation noted. Normal strength noted.       Hands:    CBG 189 Lab Results  Component Value Date   HGBA1C 12.1* 02/18/2016    Assessment & Plan:   Patrick Small was seen today for referral and hand pain.  Diagnoses and all orders for this visit:  Pain in joint of left wrist -     DG Wrist Complete Left; Future -     DG Hand Complete Left; Future -     Uric Acid  Personal history of DVT (deep vein thrombosis) -     rivaroxaban (XARELTO) 20 MG TABS tablet; Take 1 tablet (20 mg total) by mouth daily with supper.  Uncontrolled type 2 diabetes mellitus with complication, with long-term current use of insulin (HCC) -     exenatide (BYETTA) 10 MCG/0.04ML SOPN injection; Inject 0.04 mLs (10 mcg total) into the skin 2 (two) times daily with a meal. -     gabapentin (NEURONTIN) 100 MG capsule; Take 1 capsule (100 mg total) by mouth 3 (three) times daily. -     Insulin Glargine (LANTUS SOLOSTAR) 100 UNIT/ML Solostar Pen; Inject 30 Units into the skin daily at 10 pm. -     sitaGLIPtin (JANUVIA) 100 MG tablet; Take 1 tablet (100 mg total) by mouth daily. -     Glucose (CBG)  Insomnia -     Discontinue: zolpidem (AMBIEN) 10 MG tablet; Take 1 tablet (10 mg total) by mouth at bedtime as needed for sleep. -     Split night study; Future -     zolpidem (AMBIEN) 10 MG tablet; Take 1 tablet (10 mg total) by mouth at bedtime as needed for sleep.  Depression -     Discontinue: DULoxetine (CYMBALTA) 30 MG capsule; Take 1 capsule (30 mg total) by mouth daily. -     DULoxetine (CYMBALTA) 30 MG capsule; Take 1 capsule (30 mg total) by mouth daily.    No orders of the defined types were placed in this encounter.    Follow-up: No Follow-up on file.   Dessa Phi MD

## 2016-04-29 NOTE — Patient Instructions (Addendum)
Patrick Small was seen today for referral and hand pain.  Diagnoses and all orders for this visit:  Pain in joint of left wrist -     DG Wrist Complete Left; Future -     DG Hand Complete Left; Future -     Uric Acid  Personal history of DVT (deep vein thrombosis) -     rivaroxaban (XARELTO) 20 MG TABS tablet; Take 1 tablet (20 mg total) by mouth daily with supper.  Uncontrolled type 2 diabetes mellitus with complication, with long-term current use of insulin (HCC) -     exenatide (BYETTA) 10 MCG/0.04ML SOPN injection; Inject 0.04 mLs (10 mcg total) into the skin 2 (two) times daily with a meal. -     gabapentin (NEURONTIN) 100 MG capsule; Take 1 capsule (100 mg total) by mouth 3 (three) times daily. -     Insulin Glargine (LANTUS SOLOSTAR) 100 UNIT/ML Solostar Pen; Inject 30 Units into the skin daily at 10 pm. -     sitaGLIPtin (JANUVIA) 100 MG tablet; Take 1 tablet (100 mg total) by mouth daily. -     Glucose (CBG)  Insomnia -     zolpidem (AMBIEN) 10 MG tablet; Take 1 tablet (10 mg total) by mouth at bedtime as needed for sleep. -     Split night study; Future  Depression -     DULoxetine (CYMBALTA) 30 MG capsule; Take 1 capsule (30 mg total) by mouth daily.   If hand and wrist x-rays are normal I will refer you to PT  Diabetes blood sugar goals  Fasting (in AM before breakfast, 8 hrs of no eating or drinking (except water or unsweetened coffee or tea): 90-110 2 hrs after meals: < 160,   No low sugars: nothing < 70   F/u in 8 weeks for diabetes  Dr. Armen PickupFunches

## 2016-05-01 ENCOUNTER — Encounter: Payer: Self-pay | Admitting: Family Medicine

## 2016-05-01 NOTE — Assessment & Plan Note (Signed)
L hand and wrist pain with swelling He gives an inconsistent story of fall with possible FOOSH injury   Plan  Uric acid check  X-ray

## 2016-05-01 NOTE — Assessment & Plan Note (Signed)
Diabetes remains uncontrolled Due to med non compliance refilled meds

## 2016-05-05 ENCOUNTER — Telehealth: Payer: Self-pay | Admitting: *Deleted

## 2016-05-05 DIAGNOSIS — IMO0002 Reserved for concepts with insufficient information to code with codable children: Secondary | ICD-10-CM

## 2016-05-05 DIAGNOSIS — Z794 Long term (current) use of insulin: Principal | ICD-10-CM

## 2016-05-05 DIAGNOSIS — E118 Type 2 diabetes mellitus with unspecified complications: Principal | ICD-10-CM

## 2016-05-05 DIAGNOSIS — H1010 Acute atopic conjunctivitis, unspecified eye: Secondary | ICD-10-CM

## 2016-05-05 DIAGNOSIS — E1165 Type 2 diabetes mellitus with hyperglycemia: Secondary | ICD-10-CM

## 2016-05-05 NOTE — Telephone Encounter (Signed)
-----   Message from Dessa PhiJosalyn Funches, MD sent at 05/01/2016  8:33 AM EDT ----- Normal uric acid

## 2016-05-05 NOTE — Telephone Encounter (Signed)
LVM to return call.

## 2016-05-19 NOTE — Progress Notes (Signed)
Electrophysiology Office Note Date: 05/20/2016  ID:  Patrick Small, DOB 1961/03/20, MRN [id number]  PCP: Lora Paula, MD Electrophysiologist: Elberta Fortis  CC: Pacemaker follow-up  Patrick Small is a 55 y.o. male seen today for Dr Elberta Fortis.  He moved here from [place] in 08/2014.  He presented to the hospital earlier this year with syncope and generalized pain.  Device interrogation was normal. He underwent TEE to follow up on possible density seen on TTE.  TEE demonstrated vegetation vs thrombin. Without symptoms of infection, device extraction was not recommended. Blood cultures were negative.  He presents today for routine electrophysiology followup.  Since last being seen in our clinic, the patient reports doing very well.  He denies chest pain, palpitations, dyspnea, PND, orthopnea, nausea, vomiting, dizziness, syncope, edema, weight gain, or early satiety.  Device History: BSX dual chamber PPM implanted 2014 for SSS   Past Medical History  Diagnosis Date  . Acid reflux   . Acne rosacea   . Plantar fasciitis   . Thyroid disease   . Mood disorder (HCC)   . Diabetes mellitus without complication (HCC) Dx 2010  . Hyperlipidemia Dx 2010  . Hypertension Dx 2010  . Substance abuse     last alchohol intake 01/24/2013  . DVT (deep venous thrombosis) (HCC) 2012 and 2013     Left and Right leg, no history of PE     Past Surgical History  Procedure Laterality Date  . Pacemaker insertion  2014  . Tee without cardioversion N/A 02/22/2016    Procedure: TRANSESOPHAGEAL ECHOCARDIOGRAM (TEE);  Surgeon: Chilton Si, MD;  Location: [facility] ENDOSCOPY;  Service: Cardiovascular;  Laterality: N/A;    Current Outpatient Prescriptions  Medication Sig Dispense Refill  . DULoxetine (CYMBALTA) 30 MG capsule Take 1 capsule (30 mg total) by mouth daily. 30 capsule 5  . exenatide (BYETTA) 10 MCG/0.04ML SOPN injection Inject 0.04 mLs (10 mcg total) into the skin 2 (two) times daily with a meal. 2.4 mL  5  . gabapentin (NEURONTIN) 100 MG capsule Take 1 capsule (100 mg total) by mouth 3 (three) times daily. 10 capsule 0  . Insulin Glargine (LANTUS SOLOSTAR) 100 UNIT/ML Solostar Pen Inject 30 Units into the skin daily at 10 pm. 15 mL 5  . rivaroxaban (XARELTO) 20 MG TABS tablet Take 1 tablet (20 mg total) by mouth daily with supper. 30 tablet 5  . sitaGLIPtin (JANUVIA) 100 MG tablet Take 1 tablet (100 mg total) by mouth daily. 30 tablet 11  . zolpidem (AMBIEN) 10 MG tablet Take 1 tablet (10 mg total) by mouth at bedtime as needed for sleep. 30 tablet 2   No current facility-administered medications for this visit.    Allergies:   Penicillins; Bacitracin; and Septra   Social History: Social History   Social History  . Marital Status: Single    Spouse Name: N/A  . Number of Children: N/A  . Years of Education: N/A   Occupational History  . Not on file.   Social History Main Topics  . Smoking status: Current Every Day Smoker -- 6.00 packs/day    Types: Cigarettes  . Smokeless tobacco: Never Used  . Alcohol Use: 0.0 oz/week    0 Standard drinks or equivalent per week     Comment: only on weekends  . Drug Use: Yes    Special: Marijuana     Comment: last used last month about  03/30/2016  . Sexual Activity: Not on file   Other Topics Concern  .  Not on file   Social History Narrative   Moved back to [place] after living in [place] 08[place].    Moved to be closer to his aging mother.   Lived in [place] for 15 years.           Family History: Family History  Problem Relation Age of Onset  . Diabetes Mother   . Diabetes Father   . Diabetes Sister      Review of Systems: All other systems reviewed and are otherwise negative except as noted above.   Physical Exam: VS:  BP 130/90 mmHg  Pulse 80  Ht 6\' 1"  (1.854 m)  Wt 174 lb (78.926 kg)  BMI 22.96 kg/m2 , BMI Body mass index is 22.96 kg/(m^2).  GEN- The patient is well appearing, alert and oriented x 3 today.     HEENT: normocephalic, atraumatic; sclera clear, conjunctiva pink; hearing intact; oropharynx clear; neck supple  Lungs- Clear to ausculation bilaterally, normal work of breathing.  No wheezes, rales, rhonchi Heart- Regular rate and rhythm, 2/6 systolic murmur  GI- soft, non-tender, non-distended, bowel sounds present Extremities- no clubbing, cyanosis, or edema; DP/PT/radial pulses 2+ bilaterally MS- no significant deformity or atrophy Skin- warm and dry, no rash or lesion; PPM pocket well healed Psych- euthymic mood, full affect Neuro- strength and sensation are intact  PPM Interrogation- reviewed in detail today,  See PACEART report  EKG:  EKG is not ordered today.  Recent Labs: 02/18/2016: TSH 0.443 02/20/2016: Magnesium 2.0 03/04/2016: ALT 52*; BUN 10; Creat 0.79; Hemoglobin 13.3; Platelets 179; Potassium 4.3; Sodium 134*   Wt Readings from Last 3 Encounters:  05/20/16 174 lb (78.926 kg)  04/29/16 183 lb (83.008 kg)  03/24/16 175 lb (79.379 kg)     Other studies Reviewed: Additional studies/ records that were reviewed today include: hospital records   Assessment and Plan:  1.  Sick sinus syndrome Normal PPM function See Pace Art report No changes today  2.  Recurrent DVT's Continue long term anticoagulation No bleeding issues   3.  Tobacco abuse  Cessation advised  Down to 1 pack of cigarettes every 3 days    Current medicines are reviewed at length with the patient today.   The patient does not have concerns regarding his medicines.  The following changes were made today:  none  Labs/ tests ordered today include: none   Disposition:   Follow up with [facility], Dr Elberta Fortisamnitz 1 year      Signed, Gypsy BalsamAmber Seiler, NP 05/20/2016 9:59 AM  [facility] [address] [place] [address] [phone] (office) [phone] (fax)

## 2016-05-20 ENCOUNTER — Encounter: Payer: Self-pay | Admitting: Cardiology

## 2016-05-20 ENCOUNTER — Ambulatory Visit (INDEPENDENT_AMBULATORY_CARE_PROVIDER_SITE_OTHER): Payer: Medicaid Other | Admitting: Nurse Practitioner

## 2016-05-20 ENCOUNTER — Encounter: Payer: Self-pay | Admitting: Nurse Practitioner

## 2016-05-20 VITALS — BP 130/90 | HR 80 | Ht 73.0 in | Wt 174.0 lb

## 2016-05-20 DIAGNOSIS — Z720 Tobacco use: Secondary | ICD-10-CM

## 2016-05-20 DIAGNOSIS — Z86718 Personal history of other venous thrombosis and embolism: Secondary | ICD-10-CM | POA: Diagnosis not present

## 2016-05-20 DIAGNOSIS — I495 Sick sinus syndrome: Secondary | ICD-10-CM

## 2016-05-20 NOTE — Patient Instructions (Signed)
Medication Instructions:    Your physician recommends that you continue on your current medications as directed. Please refer to the Current Medication list given to you today.   If you need a refill on your cardiac medications before your next appointment, please call your pharmacy.  Labwork:  NONE ORDER TODAY   Testing/Procedures: NONE ORDER TODAY    Follow-Up:  Your physician wants you to follow-up in: ONE YEAR WITH [facility]  You will receive a reminder letter in the mail two months in advance. If you don't receive a letter, please call our office to schedule the follow-up appointment.  Remote monitoring is used to monitor your Pacemaker of ICD from home. This monitoring reduces the number of office visits required to check your device to one time per year. It allows us to keep an eye on the functioning of your device to ensure it is working properly. You are scheduled for a device check from home on .08/21/16..You may send your transmission at any time that day. If you have a wireless device, the transmission will be sent automatically. After your physician reviews your transmission, you will receive a postcard with your next transmission date.     Any Other Special Instructions Will Be Listed Below (If Applicable).

## 2016-05-22 NOTE — Telephone Encounter (Signed)
Pt. Called requesting to speak to the nurse b/c he thinks he has allergies.  He would like to know if he can take Zyrtec. Please f/u with pt.

## 2016-05-24 LAB — CUP PACEART INCLINIC DEVICE CHECK
MDC IDC SESS DTM: 20170527040402
Pulse Gen Serial Number: 113973

## 2016-05-27 MED ORDER — KETOTIFEN FUMARATE 0.025 % OP SOLN: 1.0000 [drp] | Freq: Two times a day (BID) | OPHTHALMIC | Status: DC

## 2016-05-27 NOTE — Telephone Encounter (Signed)
Date of birth verified by pt  Uric acid negative results given  Pt verbalized understanding   Pt stated taking Zyrtec for allergies, allergies a bit better Still with itchy eye  Pt requesting optometrist referral

## 2016-05-27 NOTE — Telephone Encounter (Signed)
Pt aware Referral placed, eye drops order

## 2016-05-27 NOTE — Addendum Note (Signed)
Addended by: Dessa PhiFUNCHES, Citlaly Camplin on: 05/27/2016 03:56 PM   Modules accepted: Orders

## 2016-05-27 NOTE — Telephone Encounter (Signed)
Opthalmology referral placed Antihistamine eye drops ordered for itching

## 2016-07-03 ENCOUNTER — Ambulatory Visit (HOSPITAL_BASED_OUTPATIENT_CLINIC_OR_DEPARTMENT_OTHER): Payer: Medicaid Other | Attending: Family Medicine | Admitting: Internal Medicine

## 2016-07-03 DIAGNOSIS — G4700 Insomnia, unspecified: Secondary | ICD-10-CM

## 2016-07-03 NOTE — Progress Notes (Deleted)
Subjective:     Patient ID: Patrick Small, male   DOB: 04/26/1961, 55 y.o.   MRN: [id number]  HPI   Review of Systems     Objective:   Physical Exam     Assessment:     ***    Plan:     ***

## 2016-10-02 ENCOUNTER — Other Ambulatory Visit: Payer: Self-pay | Admitting: Pharmacist

## 2016-10-02 DIAGNOSIS — F32A Depression, unspecified: Secondary | ICD-10-CM

## 2016-10-02 DIAGNOSIS — Z86718 Personal history of other venous thrombosis and embolism: Secondary | ICD-10-CM

## 2016-10-02 DIAGNOSIS — F329 Major depressive disorder, single episode, unspecified: Secondary | ICD-10-CM

## 2016-10-02 MED ORDER — DULOXETINE HCL 30 MG PO CPEP: 30.0000 mg | ORAL_CAPSULE | Freq: Every day | ORAL | 2 refills | Status: DC

## 2016-10-02 MED ORDER — RIVAROXABAN 20 MG PO TABS
20.0000 mg | ORAL_TABLET | Freq: Every day | ORAL | 2 refills | Status: DC
Start: 2016-10-02 — End: 2016-12-15

## 2016-10-14 ENCOUNTER — Encounter (HOSPITAL_COMMUNITY): Payer: Self-pay | Admitting: Emergency Medicine

## 2016-10-14 ENCOUNTER — Emergency Department (HOSPITAL_COMMUNITY): Payer: Medicaid Other

## 2016-10-14 ENCOUNTER — Emergency Department (HOSPITAL_COMMUNITY)
Admission: EM | Admit: 2016-10-14 | Discharge: 2016-10-14 | Disposition: A | Discharge status: Home / Self Care | Payer: Medicaid Other | Attending: Emergency Medicine | Admitting: Emergency Medicine

## 2016-10-14 DIAGNOSIS — E119 Type 2 diabetes mellitus without complications: Secondary | ICD-10-CM | POA: Diagnosis not present

## 2016-10-14 DIAGNOSIS — R740 Nonspecific elevation of levels of transaminase and lactic acid dehydrogenase [LDH]: Secondary | ICD-10-CM | POA: Diagnosis not present

## 2016-10-14 DIAGNOSIS — F17210 Nicotine dependence, cigarettes, uncomplicated: Secondary | ICD-10-CM | POA: Insufficient documentation

## 2016-10-14 DIAGNOSIS — I10 Essential (primary) hypertension: Secondary | ICD-10-CM | POA: Insufficient documentation

## 2016-10-14 DIAGNOSIS — Z950 Presence of cardiac pacemaker: Secondary | ICD-10-CM | POA: Insufficient documentation

## 2016-10-14 DIAGNOSIS — Z794 Long term (current) use of insulin: Secondary | ICD-10-CM | POA: Diagnosis not present

## 2016-10-14 DIAGNOSIS — R7401 Elevation of levels of liver transaminase levels: Secondary | ICD-10-CM

## 2016-10-14 DIAGNOSIS — E039 Hypothyroidism, unspecified: Secondary | ICD-10-CM | POA: Diagnosis not present

## 2016-10-14 DIAGNOSIS — R1012 Left upper quadrant pain: Secondary | ICD-10-CM | POA: Diagnosis present

## 2016-10-14 DIAGNOSIS — R197 Diarrhea, unspecified: Secondary | ICD-10-CM

## 2016-10-14 DIAGNOSIS — R112 Nausea with vomiting, unspecified: Secondary | ICD-10-CM

## 2016-10-14 DIAGNOSIS — E860 Dehydration: Secondary | ICD-10-CM

## 2016-10-14 LAB — COMPREHENSIVE METABOLIC PANEL
ALT: 108 U/L — ABNORMAL HIGH (ref 17–63)
AST: 219 U/L — AB (ref 15–41)
Albumin: 5.5 g/dL — ABNORMAL HIGH (ref 3.5–5.0)
Alkaline Phosphatase: 261 U/L — ABNORMAL HIGH (ref 38–126)
Anion gap: 27 — ABNORMAL HIGH (ref 5–15)
BILIRUBIN TOTAL: 4.7 mg/dL — AB (ref 0.3–1.2)
BUN: 14 mg/dL (ref 6–20)
CO2: 17 mmol/L — ABNORMAL LOW (ref 22–32)
CREATININE: 1.03 mg/dL (ref 0.61–1.24)
Calcium: 10.3 mg/dL (ref 8.9–10.3)
Chloride: 90 mmol/L — ABNORMAL LOW (ref 101–111)
GFR calc Af Amer: >60 mL/min (ref >60)
Glucose, Bld: 181 mg/dL — ABNORMAL HIGH (ref 65–99)
Potassium: 4.4 mmol/L (ref 3.5–5.1)
Sodium: 134 mmol/L — ABNORMAL LOW (ref 135–145)
Total Protein: 9.8 g/dL — ABNORMAL HIGH (ref 6.5–8.1)

## 2016-10-14 LAB — CBC
HCT: 39.3 % (ref 39.0–52.0)
Hemoglobin: 14.2 g/dL (ref 13.0–17.0)
MCH: 34 pg (ref 26.0–34.0)
MCHC: 36.1 g/dL — ABNORMAL HIGH (ref 30.0–36.0)
MCV: 94 fL (ref 78.0–100.0)
PLATELETS: 85 10*3/uL — AB (ref 150–400)
RBC: 4.18 MIL/uL — ABNORMAL LOW (ref 4.22–5.81)
RDW: 12.8 % (ref 11.5–15.5)
WBC: 9.3 10*3/uL (ref 4.0–10.5)

## 2016-10-14 LAB — URINALYSIS, ROUTINE W REFLEX MICROSCOPIC
Bilirubin Urine: NEGATIVE
Glucose, UA: 250 mg/dL — AB
LEUKOCYTES UA: NEGATIVE
NITRITE: NEGATIVE
Specific Gravity, Urine: 1.012 (ref 1.005–1.030)
pH: 6 (ref 5.0–8.0)

## 2016-10-14 LAB — URINE MICROSCOPIC-ADD ON

## 2016-10-14 LAB — I-STAT TROPONIN, ED: Troponin i, poc: 0.01 ng/mL (ref 0.00–0.08)

## 2016-10-14 LAB — LIPASE, BLOOD: Lipase: 11 U/L (ref 11–51)

## 2016-10-14 MED ORDER — ONDANSETRON HCL 4 MG/2ML IJ SOLN
4.0000 mg | Freq: Once | INTRAMUSCULAR | Status: AC
  Administered 2016-10-14: 4 mg via INTRAVENOUS
  Filled 2016-10-14: qty 2

## 2016-10-14 MED ORDER — ONDANSETRON HCL 4 MG PO TABS: 4.0000 mg | ORAL_TABLET | Freq: Three times a day (TID) | ORAL | 0 refills | Status: DC | PRN

## 2016-10-14 MED ORDER — IOPAMIDOL (ISOVUE-370) INJECTION 76%
100.0000 mL | Freq: Once | INTRAVENOUS | Status: AC | PRN
  Administered 2016-10-14: 100 mL via INTRAVENOUS

## 2016-10-14 MED ORDER — HYDROMORPHONE HCL 1 MG/ML IJ SOLN
1.0000 mg | Freq: Once | INTRAMUSCULAR | Status: AC
  Administered 2016-10-14: 1 mg via INTRAVENOUS
  Filled 2016-10-14: qty 1

## 2016-10-14 MED ORDER — SODIUM CHLORIDE 0.9 % IV BOLUS (SEPSIS)
500.0000 mL | Freq: Once | INTRAVENOUS | Status: AC
  Administered 2016-10-14: 500 mL via INTRAVENOUS

## 2016-10-14 NOTE — ED Notes (Signed)
PA student at bedside.  Gave patient more ice water.

## 2016-10-14 NOTE — Discharge Instructions (Signed)
Please avoid drinking alcohol as it can worsen your condition . Stay hydrated.  Take zofran as needed for nausea.  You have a small nodule in your right lung.  You will need to notify your primary care provider and to have a repeat chest CT in 12 months for recheck to make sure it's not cancer.  Avoid smoking.  Return if your condition worsen or if you have other concerns.

## 2016-10-14 NOTE — ED Provider Notes (Signed)
[facility]-EMERGENCY DEPT Provider Note   CSN: [id number] Arrival date & time: 10/14/16  1002     History   Chief Complaint Chief Complaint  Patient presents with  . Abdominal Pain  . Emesis    HPI Patrick Small is a 55 y.o. male.  HPI  55 year old male with history of DVT currently on Xarelto, diabetes, hypertension, alcoholic pancreatitis, GERD presenting with abdominal pain. Patient review acute onset of epigastric left upper quadrant abdominal pain that radiates to his back and shoulder blade along with left-sided chest pain, started at 5 AM this morning and woke him up from sleep. Pain is been persistent, and described as "someone is trying to pulled every organ out of my body". Movement seems to make pain worse, nothing makes it better aside from the medication received prior to arrival. He did report having several episodes of yellow emesis, and about of non-bloody non-he received loose stool. Also endorsed, chills, myalgias, dizziness, and did report been diaphoretic when he was vomiting. Patient has a pacemaker. He denies any recent contact and did not receive flu shot this year. Has history of hypertension but did not take his medication this a.m. Patient admits to drinking "a shot of liquor" yesterday and states he drinks usually 4 out of 7 days in a week. When asked if this felt similar to prior pancreatitis, patient states this felt different. No history of AAA. No prior MI.  Past Medical History:  Diagnosis Date  . Acid reflux   . Acne rosacea   . Diabetes mellitus without complication (HCC) Dx 2010  . DVT (deep venous thrombosis) (HCC) 2012 and 2013    Left and Right leg, no history of PE    . Hyperlipidemia Dx 2010  . Hypertension Dx 2010  . Mood disorder (HCC)   . Plantar fasciitis   . Substance abuse    last alchohol intake 01/24/2013  . Thyroid disease     Patient Active Problem List   Diagnosis Date Noted  . Pain in joint of left wrist 04/29/2016  . Diabetes  mellitus type 2, uncontrolled, with complications (HCC) 03/04/2016  . Chronic low back pain 03/04/2016  . Diabetic peripheral neuropathy associated with type 2 diabetes mellitus (HCC) 03/04/2016  . Transaminasemia   . Pacemaker infection (HCC)   . Sinus node dysfunction (HCC)   . AKI (acute kidney injury) (HCC) 02/18/2016  . Hypokalemia 02/18/2016  . Hyponatremia 02/18/2016  . Syncope 02/18/2016  . Elevated lactic acid level 02/18/2016  . Depression 11/16/2014  . Infected dental carries 11/16/2014  . Atrial fibrillation (HCC) 10/05/2014  . Anticoagulated on Coumadin 10/05/2014  . High cholesterol 09/26/2014  . Hypertension 09/25/2014  . Personal history of DVT (deep vein thrombosis) 09/25/2014  . Pacemaker 09/25/2014  . Irregular heartbeat 09/25/2014  . Hypothyroidism 09/25/2014    Past Surgical History:  Procedure Laterality Date  . PACEMAKER INSERTION  2014  . TEE WITHOUT CARDIOVERSION N/A 02/22/2016   Procedure: TRANSESOPHAGEAL ECHOCARDIOGRAM (TEE);  Surgeon: Chilton Siiffany Stillwater, MD;  [facility];  Service: Cardiovascular;  Laterality: N/A;       Home Medications    Prior to Admission medications   Medication Sig Start Date End Date Taking? Authorizing Provider  DULoxetine (CYMBALTA) 30 MG capsule Take 1 capsule (30 mg total) by mouth daily. 10/02/16  Yes Josalyn Funches, MD  exenatide (BYETTA) 10 MCG/0.04ML SOPN injection Inject 0.04 mLs (10 mcg total) into the skin 2 (two) times daily with a meal. 04/29/16  Yes Josalyn Funches,  MD  gabapentin (NEURONTIN) 100 MG capsule Take 1 capsule (100 mg total) by mouth 3 (three) times daily. 04/29/16  Yes Josalyn Funches, MD  Insulin Glargine (LANTUS SOLOSTAR) 100 UNIT/ML Solostar Pen Inject 30 Units into the skin daily at 10 pm. 04/29/16  Yes Dessa Phi, MD  rivaroxaban (XARELTO) 20 MG TABS tablet Take 1 tablet (20 mg total) by mouth daily with supper. 10/02/16  Yes Josalyn Funches, MD  sitaGLIPtin (JANUVIA) 100 MG tablet Take  1 tablet (100 mg total) by mouth daily. 04/29/16  Yes Josalyn Funches, MD  zolpidem (AMBIEN) 10 MG tablet Take 1 tablet (10 mg total) by mouth at bedtime as needed for sleep. 04/29/16 10/14/16 Yes Josalyn Funches, MD  ketotifen (ZADITOR) 0.025 % ophthalmic solution Place 1 drop into both eyes 2 (two) times daily. Patient not taking: Reported on 10/14/2016 05/27/16   Dessa Phi, MD    Family History Family History  Problem Relation Age of Onset  . Diabetes Mother   . Diabetes Father   . Diabetes Sister     Social History Social History  Substance Use Topics  . Smoking status: Current Every Day Smoker    Packs/day: 6.00    Types: Cigarettes  . Smokeless tobacco: Never Used  . Alcohol use 0.0 oz/week     Comment: only on weekends     Allergies   Penicillins; Bacitracin; and Septra [sulfamethoxazole-trimethoprim]   Review of Systems Review of Systems  All other systems reviewed and are negative.    Physical Exam Updated Vital Signs BP (!) 197/130   Pulse 81   Temp 97.6 F (36.4 C)   Resp 19   SpO2 100%   Physical Exam  Constitutional: He appears well-developed and well-nourished.  Patient appears uncomfortable but nontoxic  HENT:  Head: Atraumatic.  Eyes: Conjunctivae are normal.  Neck: Neck supple.  Cardiovascular: Normal rate, regular rhythm and intact distal pulses.   Pulmonary/Chest: Effort normal and breath sounds normal.  Abdominal: Soft. He exhibits no distension. There is tenderness (Diffuse abdominal tenderness most significant to left upper quadrant with guarding but without rebound tenderness, no abdominal bruit or pulsatile mass.).  Musculoskeletal: He exhibits tenderness (Tenderness to left lumbar paraspinal muscle on palpation, no CVA tenderness. No ecchymosis.).  Neurological: He is alert.  Skin: No rash noted.  Psychiatric: He has a normal mood and affect.  Nursing note and vitals reviewed.    ED Treatments / Results  Labs (all labs ordered  are listed, but only abnormal results are displayed) Labs Reviewed  COMPREHENSIVE METABOLIC PANEL - Abnormal; Notable for the following:       Result Value   Sodium 134 (*)    Chloride 90 (*)    CO2 17 (*)    Glucose, Bld 181 (*)    Total Protein 9.8 (*)    Albumin 5.5 (*)    AST 219 (*)    ALT 108 (*)    Alkaline Phosphatase 261 (*)    Total Bilirubin 4.7 (*)    Anion gap 27 (*)    All other components within normal limits  CBC - Abnormal; Notable for the following:    RBC 4.18 (*)    MCHC 36.1 (*)    Platelets 85 (*)    All other components within normal limits  URINALYSIS, ROUTINE W REFLEX MICROSCOPIC (NOT AT [facility]) - Abnormal; Notable for the following:    Glucose, UA 250 (*)    Hgb urine dipstick SMALL (*)    Ketones, ur >  80 (*)    Protein, ur >300 (*)    All other components within normal limits  URINE MICROSCOPIC-ADD ON - Abnormal; Notable for the following:    Squamous Epithelial / LPF 0-5 (*)    Bacteria, UA RARE (*)    All other components within normal limits  LIPASE, BLOOD  I-STAT TROPOININ, ED    EKG  EKG Interpretation None       Radiology Ct Angio Chest/abd/pel For Dissection W And/or Wo Contrast  Result Date: 10/14/2016 CLINICAL DATA:  Pain and emesis EXAM: CT ANGIOGRAPHY CHEST, ABDOMEN AND PELVIS TECHNIQUE: Multidetector CT imaging through the chest, abdomen and pelvis was performed using the standard protocol during bolus administration of intravenous contrast. Multiplanar reconstructed images and MIPs were obtained and reviewed to evaluate the vascular anatomy. CONTRAST:  100 mL Isovue 370. COMPARISON:  02/18/2016 FINDINGS: CTA CHEST FINDINGS Cardiovascular: The thoracic aorta is well visualized within normal branching pattern. No evidence of aneurysmal dilatation or dissection is seen. No coronary calcifications are noted. The cardiac structures are within normal limits. The pulmonary artery as visualized is within normal limits. Mediastinum/Nodes: The  thoracic inlet is unremarkable. No hilar, mediastinal or axillary lymphadenopathy is identified. Lungs/Pleura: The lungs are well aerated bilaterally. A tiny 3 mm nodule is noted in the right upper lobe best seen on image number 12 of series 11. No other focal nodules are seen. Musculoskeletal: No chest wall abnormality. No acute or significant osseous findings. Review of the MIP images confirms the above findings. CTA ABDOMEN AND PELVIS FINDINGS VASCULAR Aorta: Well visualized without aneurysmal dilatation. Mild calcifications are noted at the level of the aortic bifurcation. Celiac: Widely patent SMA: Widely patent Renals: Single bilaterally and patent IMA: Widely patent Iliacs: Mild calcific changes are noted although the bar injuries are patent. No aneurysmal dilatation is seen. Veins: Within normal limits. Review of the MIP images confirms the above findings. NON-VASCULAR Hepatobiliary: Diffusely fatty infiltrated. The gallbladder is within normal limits. Pancreas: Unremarkable. No pancreatic ductal dilatation or surrounding inflammatory changes. Spleen: Normal in size without focal abnormality. Adrenals/Urinary Tract: The right adrenal gland demonstrates a 17 mm hypodensity consistent with an adenoma. The left adrenal gland it is hypertrophic. Kidneys demonstrate no renal calculi or obstructive changes. No focal mass lesion is noted. The bladder is well distended Stomach/Bowel: Stomach is within normal limits. Appendix appears normal. No evidence of bowel wall thickening, distention, or inflammatory changes. Lymphatic: No significant lymphadenopathy is noted. Reproductive: Prostate is unremarkable. Other: No abdominal wall hernia or abnormality. No abdominopelvic ascites. Musculoskeletal: No acute or significant osseous findings. Review of the MIP images confirms the above findings. IMPRESSION: No acute arterial abnormality is noted. No findings to correspond with the given clinical history are noted. Fatty  liver. Tiny right apical nodule. No follow-up needed if patient is low-risk. Non-contrast chest CT can be considered in 12 months if patient is high-risk. This recommendation follows the consensus statement: Guidelines for Management of Incidental Pulmonary Nodules Detected on CT Images: From the Fleischner Society 2017; Radiology 2017; 284:228-243. Electronically Signed   By: Alcide Clever M.D.   On: 10/14/2016 12:52    Procedures Procedures (including critical care time)  Medications Ordered in ED Medications  ondansetron (ZOFRAN) injection 4 mg (4 mg Intravenous Given 10/14/16 1028)  HYDROmorphone (DILAUDID) injection 1 mg (1 mg Intravenous Given 10/14/16 1151)  ondansetron (ZOFRAN) injection 4 mg (4 mg Intravenous Given 10/14/16 1152)  sodium chloride 0.9 % bolus 500 mL (500 mLs Intravenous New Bag/Given 10/14/16  1155)  iopamidol (ISOVUE-370) 76 % injection 100 mL (100 mLs Intravenous Contrast Given 10/14/16 1226)     Initial Impression / Assessment and Plan / ED Course  I have reviewed the triage vital signs and the nursing notes.  Pertinent labs & imaging results that were available during my care of the patient were reviewed by me and considered in my medical decision making (see chart for details).  Clinical Course    BP 150/100   Pulse 89   Temp 97.6 F (36.4 C)   Resp 13   SpO2 97%    Final Clinical Impressions(s) / ED Diagnoses   Final diagnoses:  Transaminitis  Dehydration  Nausea vomiting and diarrhea    New Prescriptions New Prescriptions   No medications on file   11:52 AM Patient with history of alcohol abuse, alcoholic hepatitis here with right upper quadrant abdominal pain radiates to back and shoulder. Suspect pancreatitis causing his symptoms. Abdomen is mildly tender on palpation. Patient is found to be hypertensive with systolic blood pressure 220s likely secondary to pain and been noncompliant with his medication. He does have intact distal pulses.  He is on Xarelto. Plan to obtain chest abdomen pelvis CT angiogram to rule out dissection and to further evaluation for potential pancreatitis. Care discussed with Dr. Fayrene Fearing.   2:56 PM Chest abdominal and pelvis CTA shows no signs of dissection or any other concerning finding. Incidental small nodule noted to right lower lung field. Patient is a smoker therefore I encouraged patient to have a repeat CT scan in 12 months as recommended. At this time patient is well-appearing, he states that his pain is mostly resolved. He is able to tolerates fluid he does have evidence of transaminitis, likely due to alcohol abuse. Elevated total bili as well. Doubt obstructive pathology.  Return precaution given.     Fayrene Helper, PA-C 10/14/16 1510    Rolland Porter, MD 10/27/16 2107

## 2016-10-14 NOTE — ED Triage Notes (Signed)
Per EMS pt from home having abd pain and emesis since 5 Am today. Hx pancreatitis, report had alcohol last night. Pt co gl abd pain radiating to chest, emesis. Pt received 100 mcg Fentanyl IV and 4 mg Zofran IV by EMS. Pt alert and oriented x 4.

## 2016-10-14 NOTE — ED Notes (Signed)
PA student at bedside at bedside.

## 2016-10-17 ENCOUNTER — Emergency Department (HOSPITAL_COMMUNITY): Payer: Medicaid Other

## 2016-10-17 ENCOUNTER — Observation Stay (HOSPITAL_COMMUNITY)
Admission: EM | Admit: 2016-10-17 | Discharge: 2016-10-18 | Disposition: A | Discharge status: Home / Self Care | Payer: Medicaid Other | Attending: Internal Medicine | Admitting: Internal Medicine

## 2016-10-17 ENCOUNTER — Encounter (HOSPITAL_COMMUNITY): Payer: Self-pay | Admitting: Emergency Medicine

## 2016-10-17 DIAGNOSIS — E039 Hypothyroidism, unspecified: Secondary | ICD-10-CM | POA: Diagnosis not present

## 2016-10-17 DIAGNOSIS — E1165 Type 2 diabetes mellitus with hyperglycemia: Secondary | ICD-10-CM | POA: Diagnosis not present

## 2016-10-17 DIAGNOSIS — I10 Essential (primary) hypertension: Principal | ICD-10-CM | POA: Insufficient documentation

## 2016-10-17 DIAGNOSIS — I495 Sick sinus syndrome: Secondary | ICD-10-CM | POA: Diagnosis not present

## 2016-10-17 DIAGNOSIS — N179 Acute kidney failure, unspecified: Secondary | ICD-10-CM | POA: Diagnosis not present

## 2016-10-17 DIAGNOSIS — G43A1 Cyclical vomiting, intractable: Secondary | ICD-10-CM

## 2016-10-17 DIAGNOSIS — R1110 Vomiting, unspecified: Secondary | ICD-10-CM | POA: Diagnosis present

## 2016-10-17 DIAGNOSIS — R740 Nonspecific elevation of levels of transaminase and lactic acid dehydrogenase [LDH]: Secondary | ICD-10-CM | POA: Diagnosis not present

## 2016-10-17 DIAGNOSIS — Z950 Presence of cardiac pacemaker: Secondary | ICD-10-CM | POA: Insufficient documentation

## 2016-10-17 DIAGNOSIS — Z86718 Personal history of other venous thrombosis and embolism: Secondary | ICD-10-CM | POA: Insufficient documentation

## 2016-10-17 DIAGNOSIS — IMO0002 Reserved for concepts with insufficient information to code with codable children: Secondary | ICD-10-CM | POA: Diagnosis present

## 2016-10-17 DIAGNOSIS — E1142 Type 2 diabetes mellitus with diabetic polyneuropathy: Secondary | ICD-10-CM | POA: Diagnosis present

## 2016-10-17 DIAGNOSIS — Z794 Long term (current) use of insulin: Secondary | ICD-10-CM | POA: Insufficient documentation

## 2016-10-17 DIAGNOSIS — D696 Thrombocytopenia, unspecified: Secondary | ICD-10-CM | POA: Diagnosis not present

## 2016-10-17 DIAGNOSIS — I160 Hypertensive urgency: Secondary | ICD-10-CM | POA: Insufficient documentation

## 2016-10-17 DIAGNOSIS — F329 Major depressive disorder, single episode, unspecified: Secondary | ICD-10-CM | POA: Diagnosis not present

## 2016-10-17 DIAGNOSIS — E860 Dehydration: Secondary | ICD-10-CM | POA: Insufficient documentation

## 2016-10-17 DIAGNOSIS — Z7901 Long term (current) use of anticoagulants: Secondary | ICD-10-CM | POA: Insufficient documentation

## 2016-10-17 DIAGNOSIS — F17210 Nicotine dependence, cigarettes, uncomplicated: Secondary | ICD-10-CM | POA: Insufficient documentation

## 2016-10-17 DIAGNOSIS — R112 Nausea with vomiting, unspecified: Secondary | ICD-10-CM | POA: Diagnosis present

## 2016-10-17 DIAGNOSIS — E871 Hypo-osmolality and hyponatremia: Secondary | ICD-10-CM | POA: Insufficient documentation

## 2016-10-17 DIAGNOSIS — E876 Hypokalemia: Secondary | ICD-10-CM | POA: Diagnosis not present

## 2016-10-17 LAB — URINALYSIS, ROUTINE W REFLEX MICROSCOPIC
Glucose, UA: 100 mg/dL — AB
HGB URINE DIPSTICK: NEGATIVE
Ketones, ur: 15 mg/dL — AB
Nitrite: NEGATIVE
Protein, ur: 30 mg/dL — AB
SPECIFIC GRAVITY, URINE: 1.018 (ref 1.005–1.030)
pH: 6 (ref 5.0–8.0)

## 2016-10-17 LAB — CBC
HCT: 37.6 % — ABNORMAL LOW (ref 39.0–52.0)
HEMOGLOBIN: 13.8 g/dL (ref 13.0–17.0)
MCH: 34.2 pg — AB (ref 26.0–34.0)
MCHC: 36.7 g/dL — ABNORMAL HIGH (ref 30.0–36.0)
MCV: 93.3 fL (ref 78.0–100.0)
Platelets: 104 10*3/uL — ABNORMAL LOW (ref 150–400)
RBC: 4.03 MIL/uL — AB (ref 4.22–5.81)
RDW: 12.4 % (ref 11.5–15.5)
WBC: 6.8 10*3/uL (ref 4.0–10.5)

## 2016-10-17 LAB — COMPREHENSIVE METABOLIC PANEL
ALK PHOS: 306 U/L — AB (ref 38–126)
ALT: 154 U/L — ABNORMAL HIGH (ref 17–63)
ANION GAP: 15 (ref 5–15)
AST: 359 U/L — ABNORMAL HIGH (ref 15–41)
Albumin: 4.8 g/dL (ref 3.5–5.0)
BUN: 30 mg/dL — ABNORMAL HIGH (ref 6–20)
CALCIUM: 10.1 mg/dL (ref 8.9–10.3)
CHLORIDE: 89 mmol/L — AB (ref 101–111)
CO2: 25 mmol/L (ref 22–32)
Creatinine, Ser: 2.03 mg/dL — ABNORMAL HIGH (ref 0.61–1.24)
GFR calc non Af Amer: 35 mL/min — ABNORMAL LOW (ref >60)
GFR, EST AFRICAN AMERICAN: 41 mL/min — AB (ref >60)
Glucose, Bld: 180 mg/dL — ABNORMAL HIGH (ref 65–99)
Potassium: 3.2 mmol/L — ABNORMAL LOW (ref 3.5–5.1)
SODIUM: 129 mmol/L — AB (ref 135–145)
Total Bilirubin: 2.1 mg/dL — ABNORMAL HIGH (ref 0.3–1.2)
Total Protein: 8.7 g/dL — ABNORMAL HIGH (ref 6.5–8.1)

## 2016-10-17 LAB — DIFFERENTIAL
BASOS ABS: 0 10*3/uL (ref 0.0–0.1)
BASOS PCT: 0 %
EOS ABS: 0 10*3/uL (ref 0.0–0.7)
Eosinophils Relative: 0 %
Lymphocytes Relative: 25 %
Lymphs Abs: 1.5 10*3/uL (ref 0.7–4.0)
MONOS PCT: 8 %
Monocytes Absolute: 0.5 10*3/uL (ref 0.1–1.0)
NEUTROS ABS: 4.1 10*3/uL (ref 1.7–7.7)
NEUTROS PCT: 67 %

## 2016-10-17 LAB — LIPASE, BLOOD: LIPASE: 13 U/L (ref 11–51)

## 2016-10-17 LAB — I-STAT TROPONIN, ED
TROPONIN I, POC: 0.02 ng/mL (ref 0.00–0.08)
Troponin i, poc: 0.02 ng/mL (ref 0.00–0.08)

## 2016-10-17 LAB — CBG MONITORING, ED: GLUCOSE-CAPILLARY: 176 mg/dL — AB (ref 65–99)

## 2016-10-17 LAB — GLUCOSE, CAPILLARY: Glucose-Capillary: 195 mg/dL — ABNORMAL HIGH (ref 65–99)

## 2016-10-17 LAB — URINE MICROSCOPIC-ADD ON

## 2016-10-17 LAB — TROPONIN I
Troponin I: 0.03 ng/mL (ref <0.03)
Troponin I: 0.03 ng/mL (ref <0.03)

## 2016-10-17 LAB — MAGNESIUM: MAGNESIUM: 1.7 mg/dL (ref 1.7–2.4)

## 2016-10-17 MED ORDER — VITAMIN B-1 100 MG PO TABS
100.0000 mg | ORAL_TABLET | Freq: Every day | ORAL | Status: DC
  Administered 2016-10-18: 100 mg via ORAL
  Filled 2016-10-17: qty 1

## 2016-10-17 MED ORDER — SODIUM CHLORIDE 0.9 % IV BOLUS (SEPSIS)
1000.0000 mL | Freq: Once | INTRAVENOUS | Status: AC
  Administered 2016-10-17: 1000 mL via INTRAVENOUS

## 2016-10-17 MED ORDER — LORAZEPAM 2 MG/ML IJ SOLN: 1.0000 mg | Freq: Four times a day (QID) | INTRAMUSCULAR | Status: DC | PRN

## 2016-10-17 MED ORDER — LORAZEPAM 1 MG PO TABS: 1.0000 mg | ORAL_TABLET | Freq: Four times a day (QID) | ORAL | Status: DC | PRN

## 2016-10-17 MED ORDER — AMLODIPINE BESYLATE 10 MG PO TABS
10.0000 mg | ORAL_TABLET | Freq: Every day | ORAL | Status: DC
  Administered 2016-10-17 – 2016-10-18 (×2): 10 mg via ORAL
  Filled 2016-10-17: qty 1
  Filled 2016-10-17: qty 2

## 2016-10-17 MED ORDER — GI COCKTAIL ~~LOC~~
30.0000 mL | Freq: Once | ORAL | Status: AC
  Administered 2016-10-17: 30 mL via ORAL
  Filled 2016-10-17: qty 30

## 2016-10-17 MED ORDER — HYDRALAZINE HCL 20 MG/ML IJ SOLN: 10.0000 mg | Freq: Four times a day (QID) | INTRAMUSCULAR | Status: DC | PRN

## 2016-10-17 MED ORDER — GI COCKTAIL ~~LOC~~: 30.0000 mL | Freq: Three times a day (TID) | ORAL | Status: DC | PRN

## 2016-10-17 MED ORDER — LABETALOL HCL 5 MG/ML IV SOLN
10.0000 mg | Freq: Once | INTRAVENOUS | Status: AC
  Administered 2016-10-17: 10 mg via INTRAVENOUS
  Filled 2016-10-17: qty 4

## 2016-10-17 MED ORDER — PROMETHAZINE HCL 25 MG/ML IJ SOLN: 25.0000 mg | Freq: Once | INTRAMUSCULAR | Status: DC

## 2016-10-17 MED ORDER — MAGNESIUM CITRATE PO SOLN: 1.0000 | Freq: Once | ORAL | Status: DC | PRN

## 2016-10-17 MED ORDER — ADULT MULTIVITAMIN W/MINERALS CH
1.0000 | ORAL_TABLET | Freq: Every day | ORAL | Status: DC
  Administered 2016-10-18: 1 via ORAL
  Filled 2016-10-17: qty 1

## 2016-10-17 MED ORDER — MORPHINE SULFATE (PF) 2 MG/ML IV SOLN
2.0000 mg | Freq: Once | INTRAVENOUS | Status: AC
  Administered 2016-10-17: 2 mg via INTRAVENOUS
  Filled 2016-10-17: qty 1

## 2016-10-17 MED ORDER — INSULIN ASPART 100 UNIT/ML ~~LOC~~ SOLN
10.0000 [IU] | Freq: Three times a day (TID) | SUBCUTANEOUS | Status: DC
  Administered 2016-10-18: 10 [IU] via SUBCUTANEOUS

## 2016-10-17 MED ORDER — RIVAROXABAN 20 MG PO TABS
20.0000 mg | ORAL_TABLET | Freq: Every day | ORAL | Status: DC
  Administered 2016-10-17: 20 mg via ORAL
  Filled 2016-10-17: qty 1

## 2016-10-17 MED ORDER — INSULIN ASPART 100 UNIT/ML ~~LOC~~ SOLN
0.0000 [IU] | Freq: Three times a day (TID) | SUBCUTANEOUS | Status: DC
  Administered 2016-10-18: 3 [IU] via SUBCUTANEOUS

## 2016-10-17 MED ORDER — FOLIC ACID 1 MG PO TABS
1.0000 mg | ORAL_TABLET | Freq: Every day | ORAL | Status: DC
  Administered 2016-10-18: 1 mg via ORAL
  Filled 2016-10-17: qty 1

## 2016-10-17 MED ORDER — DULOXETINE HCL 30 MG PO CPEP
30.0000 mg | ORAL_CAPSULE | Freq: Every day | ORAL | Status: DC
  Administered 2016-10-18: 30 mg via ORAL
  Filled 2016-10-17: qty 1

## 2016-10-17 MED ORDER — LEVOTHYROXINE SODIUM 50 MCG PO TABS
50.0000 ug | ORAL_TABLET | Freq: Every day | ORAL | Status: DC
  Administered 2016-10-18: 50 ug via ORAL
  Filled 2016-10-17: qty 1

## 2016-10-17 MED ORDER — PROMETHAZINE HCL 25 MG/ML IJ SOLN
6.2500 mg | Freq: Once | INTRAMUSCULAR | Status: AC
  Administered 2016-10-17: 6.25 mg via INTRAVENOUS
  Filled 2016-10-17: qty 1

## 2016-10-17 MED ORDER — ONDANSETRON HCL 4 MG/2ML IJ SOLN: 4.0000 mg | Freq: Four times a day (QID) | INTRAMUSCULAR | Status: DC | PRN

## 2016-10-17 MED ORDER — THIAMINE HCL 100 MG/ML IJ SOLN: 100.0000 mg | Freq: Every day | INTRAMUSCULAR | Status: DC

## 2016-10-17 MED ORDER — LORAZEPAM 2 MG/ML IJ SOLN: 0.0000 mg | Freq: Two times a day (BID) | INTRAMUSCULAR | Status: DC

## 2016-10-17 MED ORDER — ACETAMINOPHEN 650 MG RE SUPP: 650.0000 mg | Freq: Four times a day (QID) | RECTAL | Status: DC | PRN

## 2016-10-17 MED ORDER — SODIUM CHLORIDE 0.9 % IV SOLN
INTRAVENOUS | Status: DC
  Administered 2016-10-17: 21:00:00 via INTRAVENOUS

## 2016-10-17 MED ORDER — ALBUTEROL SULFATE (2.5 MG/3ML) 0.083% IN NEBU: 2.5000 mg | INHALATION_SOLUTION | RESPIRATORY_TRACT | Status: DC | PRN

## 2016-10-17 MED ORDER — POTASSIUM CHLORIDE 10 MEQ/100ML IV SOLN
10.0000 meq | INTRAVENOUS | Status: AC
  Administered 2016-10-17 (×3): 10 meq via INTRAVENOUS
  Filled 2016-10-17 (×3): qty 100

## 2016-10-17 MED ORDER — DOCUSATE SODIUM 100 MG PO CAPS
100.0000 mg | ORAL_CAPSULE | Freq: Two times a day (BID) | ORAL | Status: DC
  Administered 2016-10-17 – 2016-10-18 (×2): 100 mg via ORAL
  Filled 2016-10-17 (×2): qty 1

## 2016-10-17 MED ORDER — FAMOTIDINE IN NACL 20-0.9 MG/50ML-% IV SOLN
20.0000 mg | Freq: Once | INTRAVENOUS | Status: AC
  Administered 2016-10-17: 20 mg via INTRAVENOUS
  Filled 2016-10-17: qty 50

## 2016-10-17 MED ORDER — BISACODYL 10 MG RE SUPP: 10.0000 mg | Freq: Every day | RECTAL | Status: DC | PRN

## 2016-10-17 MED ORDER — LORAZEPAM 2 MG/ML IJ SOLN: 0.0000 mg | Freq: Four times a day (QID) | INTRAMUSCULAR | Status: DC

## 2016-10-17 MED ORDER — INSULIN GLARGINE 100 UNIT/ML ~~LOC~~ SOLN
20.0000 [IU] | Freq: Every day | SUBCUTANEOUS | Status: DC
  Administered 2016-10-17: 20 [IU] via SUBCUTANEOUS
  Filled 2016-10-17 (×2): qty 0.2

## 2016-10-17 MED ORDER — ACETAMINOPHEN 325 MG PO TABS: 650.0000 mg | ORAL_TABLET | Freq: Four times a day (QID) | ORAL | Status: DC | PRN

## 2016-10-17 MED ORDER — ONDANSETRON HCL 4 MG PO TABS: 4.0000 mg | ORAL_TABLET | Freq: Three times a day (TID) | ORAL | Status: DC | PRN

## 2016-10-17 MED ORDER — POLYETHYLENE GLYCOL 3350 17 G PO PACK: 17.0000 g | PACK | Freq: Every day | ORAL | Status: DC | PRN

## 2016-10-17 NOTE — ED Notes (Signed)
Patients niece Alyse Lowboni [phone]

## 2016-10-17 NOTE — ED Notes (Signed)
Patient given ginger ale to drink 

## 2016-10-17 NOTE — ED Provider Notes (Signed)
[facility]-EMERGENCY DEPT Provider Note   CSN: [id number] Arrival date & time: 10/17/16  4098     History   Chief Complaint Chief Complaint  Patient presents with  . Emesis  . Abdominal Pain  . Dizziness    HPI Patrick Small is a 55 y.o. male.  HPI 55 year old male who presents with nausea, vomiting, abdominal pain and dizziness. History of DVT on xarelto, DM, HTN, HLD. Seen in ED 10/17 for same symptoms, onset that day, with overall reassuring blood work and CTA chest, abdomen, and pelvis. Sent home with zofran. With persistent vomiting since then despite medications and persistent LUQ abdominal pain. With burning chest pain as well that has been constant over past day or so. No syncope but feeling dizzy. States he feels room spinning.  Diarrhea on Tues but nothing since then. No melena, hematochezia, hematemesis, cough, fever or chills. No vision or speech changes, focal numbness or weakness. Drinks 3-4 x per week. Did drink Sunday, but nothing since. Denies alcohol withdrawal symptoms.   Past Medical History:  Diagnosis Date  . Acid reflux   . Acne rosacea   . Diabetes mellitus without complication (HCC) Dx 2010  . DVT (deep venous thrombosis) (HCC) 2012 and 2013    Left and Right leg, no history of PE    . Hyperlipidemia Dx 2010  . Hypertension Dx 2010  . Mood disorder (HCC)   . Plantar fasciitis   . Substance abuse    last alchohol intake 01/24/2013  . Thyroid disease     Patient Active Problem List   Diagnosis Date Noted  . Nausea and vomiting 10/17/2016  . Pain in joint of left wrist 04/29/2016  . Diabetes mellitus type 2, uncontrolled, with complications (HCC) 03/04/2016  . Chronic low back pain 03/04/2016  . Diabetic peripheral neuropathy associated with type 2 diabetes mellitus (HCC) 03/04/2016  . Transaminasemia   . Pacemaker infection (HCC)   . Sinus node dysfunction (HCC)   . AKI (acute kidney injury) (HCC) 02/18/2016  . Hypokalemia 02/18/2016  .  Hyponatremia 02/18/2016  . Syncope 02/18/2016  . Elevated lactic acid level 02/18/2016  . Depression 11/16/2014  . Infected dental carries 11/16/2014  . Atrial fibrillation (HCC) 10/05/2014  . Anticoagulated on Coumadin 10/05/2014  . High cholesterol 09/26/2014  . Hypertension 09/25/2014  . Personal history of DVT (deep vein thrombosis) 09/25/2014  . Pacemaker 09/25/2014  . Irregular heartbeat 09/25/2014  . Hypothyroidism 09/25/2014    Past Surgical History:  Procedure Laterality Date  . PACEMAKER INSERTION  2014  . TEE WITHOUT CARDIOVERSION N/A 02/22/2016   Procedure: TRANSESOPHAGEAL ECHOCARDIOGRAM (TEE);  Surgeon: Chilton Si, MD;  Location: [facility] ENDOSCOPY;  Service: Cardiovascular;  Laterality: N/A;       Home Medications    Prior to Admission medications   Medication Sig Start Date End Date Taking? Authorizing Provider  DULoxetine (CYMBALTA) 30 MG capsule Take 1 capsule (30 mg total) by mouth daily. 10/02/16  Yes Josalyn Funches, MD  exenatide (BYETTA) 10 MCG/0.04ML SOPN injection Inject 0.04 mLs (10 mcg total) into the skin 2 (two) times daily with a meal. 04/29/16  Yes Josalyn Funches, MD  insulin aspart (NOVOLOG) 100 UNIT/ML injection Inject 30 Units into the skin 3 (three) times daily with meals.   Yes Historical Provider, MD  Insulin Glargine (LANTUS SOLOSTAR) 100 UNIT/ML Solostar Pen Inject 30 Units into the skin daily at 10 pm. 04/29/16  Yes Josalyn Funches, MD  levothyroxine (SYNTHROID, LEVOTHROID) 50 MCG tablet Take 50 mcg  by mouth daily before breakfast.   Yes Historical Provider, MD  ondansetron (ZOFRAN) 4 MG tablet Take 1 tablet (4 mg total) by mouth every 8 (eight) hours as needed for nausea or vomiting. 10/14/16  Yes Fayrene Helper, PA-C  rivaroxaban (XARELTO) 20 MG TABS tablet Take 1 tablet (20 mg total) by mouth daily with supper. 10/02/16  Yes Josalyn Funches, MD  gabapentin (NEURONTIN) 100 MG capsule Take 1 capsule (100 mg total) by mouth 3 (three) times  daily. Patient not taking: Reported on 10/17/2016 04/29/16   Dessa Phi, MD  ketotifen (ZADITOR) 0.025 % ophthalmic solution Place 1 drop into both eyes 2 (two) times daily. Patient not taking: Reported on 10/14/2016 05/27/16   Dessa Phi, MD  sitaGLIPtin (JANUVIA) 100 MG tablet Take 1 tablet (100 mg total) by mouth daily. Patient not taking: Reported on 10/17/2016 04/29/16   Dessa Phi, MD  zolpidem (AMBIEN) 10 MG tablet Take 1 tablet (10 mg total) by mouth at bedtime as needed for sleep. Patient not taking: Reported on 10/17/2016 04/29/16 10/14/16  Dessa Phi, MD    Family History Family History  Problem Relation Age of Onset  . Diabetes Mother   . Diabetes Father   . Diabetes Sister     Social History Social History  Substance Use Topics  . Smoking status: Current Every Day Smoker    Packs/day: 6.00    Types: Cigarettes  . Smokeless tobacco: Never Used  . Alcohol use 0.0 oz/week     Comment: only on weekends     Allergies   Penicillins; Bacitracin; and Septra [sulfamethoxazole-trimethoprim]   Review of Systems Review of Systems 10/14 systems reviewed and are negative other than those stated in the HPI   Physical Exam Updated Vital Signs BP (!) 170/112 (BP Location: Right Arm)   Pulse 64   Temp 97.9 F (36.6 C) (Oral)   Resp 20   Ht 6\' 1"  (1.854 m)   Wt 180 lb (81.6 kg)   SpO2 100%   BMI 23.75 kg/m   Physical Exam Physical Exam  Nursing note and vitals reviewed. Constitutional:  non-toxic, and in no acute distress Head: Normocephalic and atraumatic.  Mouth/Throat: Oropharynx is clear and dry mucous membranes.  Neck: Normal range of motion. Neck supple.  Cardiovascular: Normal rate and regular rhythm.  no edema Pulmonary/Chest: Effort normal and breath sounds normal.  Abdominal: Soft. There is LUQ tenderness. There is no rebound and no guarding.  Musculoskeletal: Normal range of motion.  Neurological: Alert, no facial droop, fluent speech,  moves all extremities symmetrically Skin: Skin is warm and dry.  Psychiatric: Cooperative]   ED Treatments / Results  Labs (all labs ordered are listed, but only abnormal results are displayed) Labs Reviewed  COMPREHENSIVE METABOLIC PANEL - Abnormal; Notable for the following:       Result Value   Sodium 129 (*)    Potassium 3.2 (*)    Chloride 89 (*)    Glucose, Bld 180 (*)    BUN 30 (*)    Creatinine, Ser 2.03 (*)    Total Protein 8.7 (*)    AST 359 (*)    ALT 154 (*)    Alkaline Phosphatase 306 (*)    Total Bilirubin 2.1 (*)    GFR calc non Af Amer 35 (*)    GFR calc Af Amer 41 (*)    All other components within normal limits  CBC - Abnormal; Notable for the following:    RBC 4.03 (*)  HCT 37.6 (*)    MCH 34.2 (*)    MCHC 36.7 (*)    Platelets 104 (*)    All other components within normal limits  URINALYSIS, ROUTINE W REFLEX MICROSCOPIC (NOT AT [facility]) - Abnormal; Notable for the following:    Color, Urine AMBER (*)    APPearance CLOUDY (*)    Glucose, UA 100 (*)    Bilirubin Urine SMALL (*)    Ketones, ur 15 (*)    Protein, ur 30 (*)    Leukocytes, UA SMALL (*)    All other components within normal limits  URINE MICROSCOPIC-ADD ON - Abnormal; Notable for the following:    Squamous Epithelial / LPF 0-5 (*)    Bacteria, UA RARE (*)    All other components within normal limits  CBG MONITORING, ED - Abnormal; Notable for the following:    Glucose-Capillary 176 (*)    All other components within normal limits  LIPASE, BLOOD  DIFFERENTIAL  MAGNESIUM  TROPONIN I  TROPONIN I  TROPONIN I  HEMOGLOBIN A1C  I-STAT TROPOININ, ED  I-STAT TROPOININ, ED    EKG  EKG Interpretation  Date/Time:  Friday October 17 2016 10:16:17 EDT Ventricular Rate:  81 PR Interval:    QRS Duration: 87 QT Interval:  456 QTC Calculation: 530 R Axis:   80 Text Interpretation:  Sinus rhythm Atrial premature complex Nonspecific T abnormalities, lateral leads Prolonged QT interval  Baseline wander in lead(s) II III aVR aVF TWI in inferolateral leads Confirmed by Branndon Tuite MD, Merci Walthers [phone]) on 10/17/2016 11:31:50 AM       Radiology Dg Chest 2 View  Result Date: 10/17/2016 CLINICAL DATA:  Worsening chest pain EXAM: CHEST  2 VIEW COMPARISON:  10/14/2016 FINDINGS: Cardiac shadow is stable. Pacing device is noted and stable in appearance. The lungs are well aerated bilaterally. No focal infiltrate or sizable effusion is seen. No bony abnormality is noted. IMPRESSION: No active cardiopulmonary disease. Electronically Signed   By: Alcide Clever M.D.   On: 10/17/2016 10:56   Ct Head Wo Contrast  Result Date: 10/17/2016 CLINICAL DATA:  Dizziness with nausea and vomiting 3 days. EXAM: CT HEAD WITHOUT CONTRAST TECHNIQUE: Contiguous axial images were obtained from the base of the skull through the vertex without intravenous contrast. COMPARISON:  02/17/2016 FINDINGS: Brain: The ventricles, cisterns and other CSF spaces are within normal. There is chronic ischemic microvascular disease unchanged. No mass, mass effect, shift of midline structures or acute hemorrhage. Vascular: Within normal. Skull: Within normal. Sinuses/Orbits: Within normal. Other: Subtle soft tissue swelling over the left supraorbital region. Subtle small foci of air along the lateral aspect of the sella and just lateral to the sphenoid sinus bilaterally which may be related to recent trauma, although no evidence of fracture. IMPRESSION: No acute intracranial findings. Chronic ischemic microvascular disease. Subtle soft tissue swelling over the left supraorbital region. Subtle foci of air adjacent the sella and sphenoid sinus which may be the result of trauma, although no evidence of fracture. Electronically Signed   By: Elberta Fortis M.D.   On: 10/17/2016 15:57    Procedures Procedures (including critical care time)  Medications Ordered in ED Medications  amLODipine (NORVASC) tablet 10 mg (not administered)  gi cocktail  (Maalox,Lidocaine,Donnatal) (not administered)  ondansetron (ZOFRAN) injection 4 mg (not administered)  hydrALAZINE (APRESOLINE) injection 10 mg (not administered)  insulin aspart (novoLOG) injection 0-9 Units (not administered)  LORazepam (ATIVAN) tablet 1 mg (not administered)    Or  LORazepam (ATIVAN) injection  1 mg (not administered)  thiamine (VITAMIN B-1) tablet 100 mg (not administered)    Or  thiamine (B-1) injection 100 mg (not administered)  folic acid (FOLVITE) tablet 1 mg (not administered)  multivitamin with minerals tablet 1 tablet (not administered)  LORazepam (ATIVAN) injection 0-4 mg (0 mg Intravenous Not Given 10/17/16 1822)    Followed by  LORazepam (ATIVAN) injection 0-4 mg (not administered)  sodium chloride 0.9 % bolus 1,000 mL (0 mLs Intravenous Stopped 10/17/16 1344)  famotidine (PEPCID) IVPB 20 mg premix (0 mg Intravenous Stopped 10/17/16 1210)  promethazine (PHENERGAN) injection 6.25 mg (6.25 mg Intravenous Given 10/17/16 1207)  potassium chloride 10 mEq in 100 mL IVPB (0 mEq Intravenous Stopped 10/17/16 1728)  labetalol (NORMODYNE,TRANDATE) injection 10 mg (10 mg Intravenous Given 10/17/16 1644)  morphine 2 MG/ML injection 2 mg (2 mg Intravenous Given 10/17/16 1807)  gi cocktail (Maalox,Lidocaine,Donnatal) (30 mLs Oral Given 10/17/16 1807)     Initial Impression / Assessment and Plan / ED Course  I have reviewed the triage vital signs and the nursing notes.  Pertinent labs & imaging results that were available during my care of the patient were reviewed by me and considered in my medical decision making (see chart for details).  Clinical Course    55 year old male who presents with nausea, vomiting dizziness with upper abdominal pain. Hypertensive in ED. No meds for HTN in 1-2 years he states due to low BP in the past. Afebrile. Appears dry on exam. Benign abdomen. Intact neuro.   Old records reviewed. CTA chest, abdomen, pelvis negative from few days  ago. His blood work today shows dehydration with AKI, hyponatremia and mild hyponatremia. With increased QTc prolongation. Repleted potassium but limited antiemetics can be given QTc 530 today. Reduced dose of phenergan given with pepcid and IVF given. Continues to be hypertensive and symptomatic. No prior history of gastroparesis but possibility. Differential also includes gastritis given recent drinking Sunday vs hypertensive urgency w/ dizziness, n/v. Symptoms minimally improved. Discussed with Dr. Randol KernElgergawy who will admit for symptomatic control for observation   Final Clinical Impressions(s) / ED Diagnoses   Final diagnoses:  Non-intractable vomiting with nausea, unspecified vomiting type  Essential hypertension    New Prescriptions New Prescriptions   No medications on file     Lavera Guiseana Duo Aayan Haskew, MD 10/17/16 [phone]

## 2016-10-17 NOTE — H&P (Signed)
TRH H&P   Patient Demographics:    Patrick Small, is a 55 y.o. male  MRN: [id number]   DOB - March 23, 1961  Admit Date - 10/17/2016  Outpatient Primary MD for the patient is Lora Paula, MD  Referring MD/NP/PA: Dr Verdie Mosher  Outpatient Specialists: EP Dr Leward Quan  Patient coming from: Home  Chief Complaint  Patient presents with  . Emesis  . Abdominal Pain  . Dizziness      HPI:    Patrick Small  is a 55 y.o. male, history of insulin-dependent diabetes mellitus, sick sinus syndrome status post PPM, alcohol dependence , hypertension, hypothyroidism, presents with complaints of abdominal pain, nausea, and vomiting, reports his symptoms have been going on for last 3 days, was here on 10/17, CTA chest, abdomen, pelvis was done with no acute findings, patient reports persistent symptoms, so he came back to ED today, reports nonbilious vomiting, poor oral intake, denies any fever, chills, cough, hemoptysis, coffee-ground emesis, diarrhea or constipation, reports any such previous symptoms in the past. - in ED workup significant for sodium 129, hypokalemia POTASSIUM 3.2, and AST 359, ALT of 154, total bili of 2.1, alkaline phosphatase of 306.    Review of systems:    In addition to the HPI above,  No Fever-chills, No Headache, No changes with Vision or hearing, No problems swallowing food or Liquids, No Chest pain, Cough or Shortness of Breath, Complains of  Abdominal pain,  Nausea and Vommitting,reportsBowel movements are regular, No Blood in stool or Urine, No dysuria, No new skin rashes or bruises, No new joints pains-aches,  No new weakness, tingling, numbness in any extremity, No recent weight gain or loss, No polyuria, polydypsia or polyphagia, No significant Mental Stressors.  A full 10 point Review of Systems was done, except as stated above, all other Review of  Systems were negative.   With Past History of the following :    Past Medical History:  Diagnosis Date  . Acid reflux   . Acne rosacea   . Diabetes mellitus without complication (HCC) Dx 2010  . DVT (deep venous thrombosis) (HCC) 2012 and 2013    Left and Right leg, no history of PE    . Hyperlipidemia Dx 2010  . Hypertension Dx 2010  . Mood disorder (HCC)   . Plantar fasciitis   . Substance abuse    last alchohol intake 01/24/2013  . Thyroid disease       Past Surgical History:  Procedure Laterality Date  . PACEMAKER INSERTION  2014  . TEE WITHOUT CARDIOVERSION N/A 02/22/2016   Procedure: TRANSESOPHAGEAL ECHOCARDIOGRAM (TEE);  Surgeon: Chilton Si, MD;  Location: [facility] ENDOSCOPY;  Service: Cardiovascular;  Laterality: N/A;      Social History:     Social History  Substance Use Topics  . Smoking status: Current Every Day Smoker    Packs/day: 6.00    Types: Cigarettes  .  Smokeless tobacco: Never Used  . Alcohol use 0.0 oz/week     Comment: only on weekends     Lives - At home  Mobility - independent    Family History :     Family History  Problem Relation Age of Onset  . Diabetes Mother   . Diabetes Father   . Diabetes Sister       Home Medications:   Prior to Admission medications   Medication Sig Start Date End Date Taking? Authorizing Provider  DULoxetine (CYMBALTA) 30 MG capsule Take 1 capsule (30 mg total) by mouth daily. 10/02/16  Yes Josalyn Funches, MD  exenatide (BYETTA) 10 MCG/0.04ML SOPN injection Inject 0.04 mLs (10 mcg total) into the skin 2 (two) times daily with a meal. 04/29/16  Yes Josalyn Funches, MD  insulin aspart (NOVOLOG) 100 UNIT/ML injection Inject 30 Units into the skin 3 (three) times daily with meals.   Yes Historical Provider, MD  Insulin Glargine (LANTUS SOLOSTAR) 100 UNIT/ML Solostar Pen Inject 30 Units into the skin daily at 10 pm. 04/29/16  Yes Josalyn Funches, MD  levothyroxine (SYNTHROID, LEVOTHROID) 50 MCG tablet Take 50  mcg by mouth daily before breakfast.   Yes Historical Provider, MD  ondansetron (ZOFRAN) 4 MG tablet Take 1 tablet (4 mg total) by mouth every 8 (eight) hours as needed for nausea or vomiting. 10/14/16  Yes Fayrene HelperBowie Tran, PA-C  rivaroxaban (XARELTO) 20 MG TABS tablet Take 1 tablet (20 mg total) by mouth daily with supper. 10/02/16  Yes Josalyn Funches, MD  gabapentin (NEURONTIN) 100 MG capsule Take 1 capsule (100 mg total) by mouth 3 (three) times daily. Patient not taking: Reported on 10/17/2016 04/29/16   Dessa PhiJosalyn Funches, MD  ketotifen (ZADITOR) 0.025 % ophthalmic solution Place 1 drop into both eyes 2 (two) times daily. Patient not taking: Reported on 10/14/2016 05/27/16   Dessa PhiJosalyn Funches, MD  sitaGLIPtin (JANUVIA) 100 MG tablet Take 1 tablet (100 mg total) by mouth daily. Patient not taking: Reported on 10/17/2016 04/29/16   Dessa PhiJosalyn Funches, MD  zolpidem (AMBIEN) 10 MG tablet Take 1 tablet (10 mg total) by mouth at bedtime as needed for sleep. Patient not taking: Reported on 10/17/2016 04/29/16 10/14/16  Dessa PhiJosalyn Funches, MD     Allergies:     Allergies  Allergen Reactions  . Penicillins Hives    Has patient had a PCN reaction causing immediate rash, facial/tongue/throat swelling, SOB or lightheadedness with hypotension: Yes Has patient had a PCN reaction causing severe rash involving mucus membranes or skin necrosis: No Has patient had a PCN reaction that required hospitalization: No Has patient had a PCN reaction occurring within the last 10 years: No If all of the above answers are "NO", then may proceed with Cephalosporin use.   . Bacitracin Itching, Swelling and Rash  . Septra [Sulfamethoxazole-Trimethoprim] Itching, Swelling and Rash     Physical Exam:   Vitals  Blood pressure 181/92, pulse 66, temperature 97.9 F (36.6 C), temperature source Oral, resp. rate 16, height 6\' 1"  (1.854 m), weight 81.6 kg (180 lb), SpO2 100 %.   1. General  Well-developed male lying in bed in NAD,     2. Normal affect and insight, Not Suicidal or Homicidal, Awake Alert, Oriented X 3.  3. No F.N deficits, ALL C.Nerves Intact, Strength 5/5 all 4 extremities, Sensation intact all 4 extremities, Plantars down going.  4. Ears and Eyes appear Normal, Conjunctivae clear, PERRLA. Moist Oral Mucosa.  5. Supple Neck, No JVD, No cervical lymphadenopathy appriciated,  No Carotid Bruits.  6. Symmetrical Chest wall movement, Good air movement bilaterally, CTAB.  7. RRR, No Gallops, Rubs or Murmurs, No Parasternal Heave.  8. Positive Bowel Sounds, Abdomen Soft, mild diffuse tenderness, No organomegaly appriciated,No rebound -guarding or rigidity.  9.  No Cyanosis, Normal Skin Turgor, No Skin Rash or Bruise.  10. Good muscle tone,  joints appear normal , no effusions, Normal ROM.  11. No Palpable Lymph Nodes in Neck or Axillae    Data Review:    CBC  Recent Labs Lab 10/14/16 1020 10/17/16 1036  WBC 9.3 6.8  HGB 14.2 13.8  HCT 39.3 37.6*  PLT 85* 104*  MCV 94.0 93.3  MCH 34.0 34.2*  MCHC 36.1* 36.7*  RDW 12.8 12.4  LYMPHSABS  --  1.5  MONOABS  --  0.5  EOSABS  --  0.0  BASOSABS  --  0.0   ------------------------------------------------------------------------------------------------------------------  Chemistries   Recent Labs Lab 10/14/16 1020 10/17/16 1036 10/17/16 1048  NA 134* 129*  --   K 4.4 3.2*  --   CL 90* 89*  --   CO2 17* 25  --   GLUCOSE 181* 180*  --   BUN 14 30*  --   CREATININE 1.03 2.03*  --   CALCIUM 10.3 10.1  --   MG  --   --  1.7  AST 219* 359*  --   ALT 108* 154*  --   ALKPHOS 261* 306*  --   BILITOT 4.7* 2.1*  --    ------------------------------------------------------------------------------------------------------------------ estimated creatinine clearance is 47 mL/min (by C-G formula based on SCr of 2.03 mg/dL (H)). ------------------------------------------------------------------------------------------------------------------ No  results for input(s): TSH, T4TOTAL, T3FREE, THYROIDAB in the last 72 hours.  Invalid input(s): FREET3  Coagulation profile No results for input(s): INR, PROTIME in the last 168 hours. ------------------------------------------------------------------------------------------------------------------- No results for input(s): DDIMER in the last 72 hours. -------------------------------------------------------------------------------------------------------------------  Cardiac Enzymes No results for input(s): CKMB, TROPONINI, MYOGLOBIN in the last 168 hours.  Invalid input(s): CK ------------------------------------------------------------------------------------------------------------------ No results found for: BNP   ---------------------------------------------------------------------------------------------------------------  Urinalysis    Component Value Date/Time   COLORURINE AMBER (A) 10/17/2016 1009   APPEARANCEUR CLOUDY (A) 10/17/2016 1009   LABSPEC 1.018 10/17/2016 1009   PHURINE 6.0 10/17/2016 1009   GLUCOSEU 100 (A) 10/17/2016 1009   HGBUR NEGATIVE 10/17/2016 1009   BILIRUBINUR SMALL (A) 10/17/2016 1009   KETONESUR 15 (A) 10/17/2016 1009   PROTEINUR 30 (A) 10/17/2016 1009   UROBILINOGEN 0.2 09/18/2014 1415   NITRITE NEGATIVE 10/17/2016 1009   LEUKOCYTESUR SMALL (A) 10/17/2016 1009    ----------------------------------------------------------------------------------------------------------------   Imaging Results:    Dg Chest 2 View  Result Date: 10/17/2016 CLINICAL DATA:  Worsening chest pain EXAM: CHEST  2 VIEW COMPARISON:  10/14/2016 FINDINGS: Cardiac shadow is stable. Pacing device is noted and stable in appearance. The lungs are well aerated bilaterally. No focal infiltrate or sizable effusion is seen. No bony abnormality is noted. IMPRESSION: No active cardiopulmonary disease. Electronically Signed   By: Alcide Clever M.D.   On: 10/17/2016 10:56   Ct Head  Wo Contrast  Result Date: 10/17/2016 CLINICAL DATA:  Dizziness with nausea and vomiting 3 days. EXAM: CT HEAD WITHOUT CONTRAST TECHNIQUE: Contiguous axial images were obtained from the base of the skull through the vertex without intravenous contrast. COMPARISON:  02/17/2016 FINDINGS: Brain: The ventricles, cisterns and other CSF spaces are within normal. There is chronic ischemic microvascular disease unchanged. No mass, mass effect, shift of midline structures or acute  hemorrhage. Vascular: Within normal. Skull: Within normal. Sinuses/Orbits: Within normal. Other: Subtle soft tissue swelling over the left supraorbital region. Subtle small foci of air along the lateral aspect of the sella and just lateral to the sphenoid sinus bilaterally which may be related to recent trauma, although no evidence of fracture. IMPRESSION: No acute intracranial findings. Chronic ischemic microvascular disease. Subtle soft tissue swelling over the left supraorbital region. Subtle foci of air adjacent the sella and sphenoid sinus which may be the result of trauma, although no evidence of fracture. Electronically Signed   By: Elberta Fortis M.D.   On: 10/17/2016 15:57    My personal review of EKG: Rhythm NSR, Rate 73   /min, QTc 431 .   Assessment & Plan:    Active Problems:   Hypertension   Personal history of DVT (deep vein thrombosis)   Pacemaker   Hypothyroidism   AKI (acute kidney injury) (HCC)   Hyponatremia   Sinus node dysfunction (HCC)   Diabetes mellitus type 2, uncontrolled, with complications (HCC)   Diabetic peripheral neuropathy associated with type 2 diabetes mellitus (HCC)   Nausea and vomiting  Nausea/vomiting/abdominal pain - Recent CTA chest abdomen pelvis with no acute finding, lipase within normal limits - Unclear etiology, possibly related to hypertensive urgency, versus possible gastroparesis given poorly controlled diabetes mellitus - Will control blood pressure, if still having symptoms,  possibly related to gastroparesis, may need gastric empty study - We'll check troponins - Continue with when necessary nausea medication, will start on GI cocktail and PPI  Hypertensive urgency - Not taking any home medication as has been stopped last admission in February. - Blood pressure control in ED, will start amlodipine, when necessary hydralazine  Diabetes mellitus type 2 with polyneuropathy - Continue Lantus at a lower dose 30-20, continue with NovoLog before meals and at a lower dose 30-10, will start an insulin sliding scale - Hold oral hypoglycemic agent - Check glycohemoglobin A1c - Continue gabapentin  Acute kidney injury  -Secondary to volume depletion, continue with IV fluid  History of DVT, recurrent - Continue with Xarelto  Transaminasemia - Hepatis panel during recent admission in February was negative, this is most likely Related to alcohol abuse.  Depression - Continue Cymbalta  Hypokalemia - Repleting, recheck in a.m.  Hyponatremia - Continue to volume depletion, continue with IV normal saline, recheck in a.m.  Hx Hypothyroidism - Continue with Synthroid  Thrombocytopenia - Stable, most likely related to alcohol abuse   Alcohol dependence - Patient reports last drink was last Sunday, will start on CIWA protocol .   Sick sinus syndrome status post pacemaker - Followed by EP as an outpatient(questionable vegetation versus fibrin clot last admission in February, but no evidence of infection then, so there was no indication for antibiotic)  DVT Prophylaxis : on Xarelto  AM Labs Ordered, also please review Full Orders  Family Communication: Admission, patients condition and plan of care including tests being ordered have been discussed with the patient who indicate understanding and agree with the plan and Code Status.  Code Status Full  Likely DC to  Home  Condition GUARDED    Consults called: one  Admission status:  observation  Time spent in minutes : 65 minutes   Andrell Tallman M.D on 10/17/2016 at 5:15 PM  Between 7am to 7pm - Pager - [phone]. After 7pm go to [email or web address] - password [facility]  [facility] - Office  [phone]

## 2016-10-17 NOTE — ED Notes (Signed)
Hospitalist at bedside 

## 2016-10-17 NOTE — Progress Notes (Signed)
[facility]  EDCM spoke to patient at bedside.  Patient reports he lives at home with his niece.  He reports he uses a cane to ambulate.  Patient [facility] on [address]  Patient reports he is able to wash himself but sometimes has trouble getting dressed.  He reports his niece and his sister sometimes are able to take him to his appointments.  If they are unable too, he reports he calls Medicaid transport.  He confirms his p[facility] is [facility]ches [facility] and reports he has to make a follow up appointment. Patient is diabetic.  He reports he uses the insulin pens and has a glucometer.  He reports he is interested in home health services because some days it is difficult to get out of bed, can't get out of bed, or too sore to get out of bed.  He [place] visiting [place] in [place].   Patient reports he does not drive.  EDCM provided patient with a [place] health [place], explained services.  Encouraged patient to speak to his pcp regarding out patient physical therapy if needed and PCS services if needed.  Patient verbalized understanding and thankful for visit.  No further EDCM needs at this time.

## 2016-10-17 NOTE — ED Notes (Signed)
Liu aware of pt status and complaint.

## 2016-10-17 NOTE — ED Triage Notes (Addendum)
Pt complaint of continued emesis unrelieved by zofran, abdominal pain, dizziness, and and central chest pain ONLY with emesis. Pt seen for same on Tuesday. Pt took zofran PTA. Pt has extensive hx acid reflux, pancreatitis, DVT, and pacemaker, and other.

## 2016-10-17 NOTE — ED Notes (Signed)
Pt transported to DG.  

## 2016-10-17 NOTE — Progress Notes (Signed)
Pharmacy - Xarelto dosing  Assessment: 55 yoM on Xarelto for recent PE, now with worsening renal function. Pharmacy consulted to evaluate current dosing (continued from PTA).   SCr 2.03, doubled since 10/17  Hgb wnl, Plt low at 104  Plan:  For VTE indication, may continue full-dose Xarelto down to CrCl 30 ml/min (using TBW), so would continue current dose for now. Patient with low platelets, but not otherwise frail  Pharmacy to monitor CrCl and platelets peripherally; will sign off of formal note-writing  Bernadene Personrew Josemiguel Gries, PharmD, BCPS Pager: [phone] 10/17/2016, 8:06 PM

## 2016-10-17 NOTE — ED Notes (Signed)
18:38 to floor. 

## 2016-10-17 NOTE — ED Notes (Signed)
At this time PT still unable to give urine sample 

## 2016-10-17 NOTE — ED Notes (Addendum)
Patient transported to CT 

## 2016-10-17 NOTE — ED Notes (Signed)
PT aware of urine sample. Urinal in hand. RN at bedside starting IV

## 2016-10-18 DIAGNOSIS — N179 Acute kidney failure, unspecified: Secondary | ICD-10-CM | POA: Diagnosis not present

## 2016-10-18 DIAGNOSIS — G43A1 Cyclical vomiting, intractable: Secondary | ICD-10-CM | POA: Diagnosis not present

## 2016-10-18 DIAGNOSIS — I10 Essential (primary) hypertension: Secondary | ICD-10-CM | POA: Diagnosis not present

## 2016-10-18 LAB — BASIC METABOLIC PANEL
Anion gap: 9 (ref 5–15)
Anion gap: 6 (ref 5–15)
BUN: 20 mg/dL (ref 6–20)
BUN: 19 mg/dL (ref 6–20)
CHLORIDE: 95 mmol/L — AB (ref 101–111)
CO2: 29 mmol/L (ref 22–32)
CO2: 30 mmol/L (ref 22–32)
CREATININE: 1.47 mg/dL — AB (ref 0.61–1.24)
CREATININE: 1.25 mg/dL — AB (ref 0.61–1.24)
Calcium: 9.3 mg/dL (ref 8.9–10.3)
Calcium: 9.9 mg/dL (ref 8.9–10.3)
Chloride: 96 mmol/L — ABNORMAL LOW (ref 101–111)
GFR calc Af Amer: >60 mL/min (ref >60)
GFR calc Af Amer: >60 mL/min (ref >60)
GFR calc non Af Amer: >60 mL/min (ref >60)
GFR, EST NON AFRICAN AMERICAN: 52 mL/min — AB (ref >60)
GLUCOSE: 48 mg/dL — AB (ref 65–99)
GLUCOSE: 118 mg/dL — AB (ref 65–99)
POTASSIUM: 3.7 mmol/L (ref 3.5–5.1)
Potassium: 2.6 mmol/L — CL (ref 3.5–5.1)
SODIUM: 134 mmol/L — AB (ref 135–145)
SODIUM: 131 mmol/L — AB (ref 135–145)

## 2016-10-18 LAB — CBC
HEMATOCRIT: 32.9 % — AB (ref 39.0–52.0)
Hemoglobin: 12 g/dL — ABNORMAL LOW (ref 13.0–17.0)
MCH: 34 pg (ref 26.0–34.0)
MCHC: 36.5 g/dL — AB (ref 30.0–36.0)
MCV: 93.2 fL (ref 78.0–100.0)
PLATELETS: 94 10*3/uL — AB (ref 150–400)
RBC: 3.53 MIL/uL — ABNORMAL LOW (ref 4.22–5.81)
RDW: 12.4 % (ref 11.5–15.5)
WBC: 6.4 10*3/uL (ref 4.0–10.5)

## 2016-10-18 LAB — MAGNESIUM: Magnesium: 1.5 mg/dL — ABNORMAL LOW (ref 1.7–2.4)

## 2016-10-18 LAB — GLUCOSE, CAPILLARY
GLUCOSE-CAPILLARY: 206 mg/dL — AB (ref 65–99)
Glucose-Capillary: 83 mg/dL (ref 65–99)

## 2016-10-18 LAB — TROPONIN I: Troponin I: <0.03 ng/mL (ref <0.03)

## 2016-10-18 MED ORDER — POTASSIUM CHLORIDE ER 10 MEQ PO TBCR: EXTENDED_RELEASE_TABLET | ORAL | 0 refills | Status: DC

## 2016-10-18 MED ORDER — POTASSIUM CHLORIDE 10 MEQ/100ML IV SOLN
10.0000 meq | INTRAVENOUS | Status: AC
  Administered 2016-10-18 (×2): 10 meq via INTRAVENOUS
  Filled 2016-10-18: qty 100

## 2016-10-18 MED ORDER — POTASSIUM CHLORIDE 10 MEQ/100ML IV SOLN: 10.0000 meq | INTRAVENOUS | Status: AC

## 2016-10-18 MED ORDER — ADULT MULTIVITAMIN W/MINERALS CH: 1.0000 | ORAL_TABLET | Freq: Every day | ORAL | 0 refills | Status: DC

## 2016-10-18 MED ORDER — POTASSIUM CHLORIDE 10 MEQ/100ML IV SOLN
10.0000 meq | INTRAVENOUS | Status: DC
Start: 2016-10-18 — End: 2016-10-18

## 2016-10-18 MED ORDER — FOLIC ACID 1 MG PO TABS: 1.0000 mg | ORAL_TABLET | Freq: Every day | ORAL | 0 refills | Status: DC

## 2016-10-18 MED ORDER — AMLODIPINE BESYLATE 10 MG PO TABS: 10.0000 mg | ORAL_TABLET | Freq: Every day | ORAL | 0 refills | Status: DC

## 2016-10-18 MED ORDER — THIAMINE HCL 100 MG PO TABS: 100.0000 mg | ORAL_TABLET | Freq: Every day | ORAL | 0 refills | Status: DC

## 2016-10-18 MED ORDER — POTASSIUM CHLORIDE CRYS ER 20 MEQ PO TBCR
40.0000 meq | EXTENDED_RELEASE_TABLET | ORAL | Status: AC
  Administered 2016-10-18 (×3): 40 meq via ORAL
  Filled 2016-10-18 (×3): qty 2

## 2016-10-18 MED ORDER — MAGNESIUM SULFATE 2 GM/50ML IV SOLN
2.0000 g | Freq: Once | INTRAVENOUS | Status: AC
  Administered 2016-10-18: 2 g via INTRAVENOUS
  Filled 2016-10-18: qty 50

## 2016-10-18 NOTE — Discharge Instructions (Signed)
Follow with Primary MD Patrick Small, Patrick C, MD in 7 days   Get CBC, CMP,checked  by Primary MD next visit.    Activity: As tolerated with Full fall precautions use walker/cane & assistance as needed   Disposition Home    Diet: Heart Healthy , Carb Modified .  For Heart failure patients - Check your Weight same time everyday, if you gain over 2 pounds, or you develop in leg swelling, experience more shortness of breath or chest pain, call your Primary MD immediately. Follow Cardiac Low Salt Diet and 1.5 lit/day fluid restriction.   On your next visit with your primary care physician please Get Medicines reviewed and adjusted.   Please request your Prim.MD to go over all Hospital Tests and Procedure/Radiological results at the follow up, please get all Hospital records sent to your Prim MD by signing hospital release before you go home.   If you experience worsening of your admission symptoms, develop shortness of breath, life threatening emergency, suicidal or homicidal thoughts you must seek medical attention immediately by calling 911 or calling your MD immediately  if symptoms less severe.  You Must read complete instructions/literature along with all the possible adverse reactions/side effects for all the Medicines you take and that have been prescribed to you. Take any new Medicines after you have completely understood and accpet all the possible adverse reactions/side effects.   Do not drive, operating heavy machinery, perform activities at heights, swimming or participation in water activities or provide baby sitting services if your were admitted for syncope or siezures until you have seen by Primary MD or a Neurologist and advised to do so again.  Do not drive when taking Pain medications.    Do not take more than prescribed Pain, Sleep and Anxiety Medications  Special Instructions: If you have smoked or chewed Tobacco  in the last 2 yrs please stop smoking, stop any regular  Alcohol  and or any Recreational drug use.  Wear Seat belts while driving.   Please note  You were cared for by a hospitalist during your hospital stay. If you have any questions about your discharge medications or the care you received while you were in the hospital after you are discharged, you can call the unit and asked to speak with the hospitalist on call if the hospitalist that took care of you is not available. Once you are discharged, your primary care physician will handle any further medical issues. Please note that NO REFILLS for any discharge medications will be authorized once you are discharged, as it is imperative that you return to your primary care physician (or establish a relationship with a primary care physician if you do not have one) for your aftercare needs so that they can reassess your need for medications and monitor your lab values.

## 2016-10-18 NOTE — Progress Notes (Signed)
Pt D/C'd home instruction reviewed acknowledge understanding.

## 2016-10-18 NOTE — Discharge Summary (Signed)
Patrick Small, is a 55 y.o. male  DOB 11-08-1961  MRN [id number].  Admission date:  10/17/2016  Admitting Physician  Starleen Arms, MD  Discharge Date:  10/18/2016   Primary MD  Lora Paula, MD  Recommendations for primary care physician for things to follow:  - Please check CBC, BMP during next visit. - Follow on Hemoglobin A1c, still pending at time of discharge  Admission Diagnosis  Essential hypertension [I10] Non-intractable vomiting with nausea, unspecified vomiting type [R11.2]   Discharge Diagnosis  Essential hypertension [I10] Non-intractable vomiting with nausea, unspecified vomiting type [R11.2]    Active Problems:   Hypertension   Personal history of DVT (deep vein thrombosis)   Pacemaker   Hypothyroidism   AKI (acute kidney injury) (HCC)   Hyponatremia   Sinus node dysfunction (HCC)   Diabetes mellitus type 2, uncontrolled, with complications (HCC)   Diabetic peripheral neuropathy associated with type 2 diabetes mellitus (HCC)   Nausea and vomiting      Past Medical History:  Diagnosis Date  . Acid reflux   . Acne rosacea   . Diabetes mellitus without complication (HCC) Dx 2010  . DVT (deep venous thrombosis) (HCC) 2012 and 2013    Left and Right leg, no history of PE    . Hyperlipidemia Dx 2010  . Hypertension Dx 2010  . Mood disorder (HCC)   . Plantar fasciitis   . Substance abuse    last alchohol intake 01/24/2013  . Thyroid disease     Past Surgical History:  Procedure Laterality Date  . PACEMAKER INSERTION  2014  . TEE WITHOUT CARDIOVERSION N/A 02/22/2016   Procedure: TRANSESOPHAGEAL ECHOCARDIOGRAM (TEE);  Surgeon: Chilton Si, MD;  Location: [facility] ENDOSCOPY;  Service: Cardiovascular;  Laterality: N/A;       History of present illness and  Hospital Course:     Kindly see H&P for history of present illness and admission details, please review  complete Labs, Consult reports and Test reports for all details in brief  HPI  from the history and physical done on the day of admission 10/17/2016  Patrick Small  is a 55 y.o. male, history of insulin-dependent diabetes mellitus, sick sinus syndrome status post PPM, alcohol dependence , hypertension, hypothyroidism, presents with complaints of abdominal pain, nausea, and vomiting, reports his symptoms have been going on for last 3 days, was here on 10/17, CTA chest, abdomen, pelvis was done with no acute findings, patient reports persistent symptoms, so he came back to ED today, reports nonbilious vomiting, poor oral intake, denies any fever, chills, cough, hemoptysis, coffee-ground emesis, diarrhea or constipation, reports any such previous symptoms in the past. - in ED workup significant for sodium 129, hypokalemia POTASSIUM 3.2, and AST 359, ALT of 154, total bili of 2.1, alkaline phosphatase of 306.   Hospital Course   Nausea/vomiting/abdominal pain - Recent CTA chest abdomen pelvis with no acute finding, lipase within normal limits - Very likely related to hypertensive urgency, admitted overnight for observation, blood pressure was controlled, with  total resolution of symptoms . versus possible gastroparesis given poorly controlled diabetes mellitus - Advanced to soft diet which he tolerated very well today.  Hypertensive urgency - Not taking any home medication as has been stopped last admission in February. - Blood pressure uncontrolled on admission, started on amlodipine, with good control, we'll discharge on amlodipine  Diabetes mellitus type 2 with polyneuropathy - Resume home medications on discharge   Acute kidney injury  -Secondary to volume depletion, currently improved with IV fluids, creatinine 1.47 on discharge  History of DVT, recurrent - Continue with Xarelto  Transaminasemia - Hepatis panel during recent admission in February was negative, this is most  likely Related to alcohol abuse.  Depression - Continue Cymbalta  Hypokalemia/hypomagnesemia - With difficult hypokalemia of 2.6 this a.m., as well as hypomagnesemia 1.5 today, repleted, potassium is 3.7on discharge, we'll discharge on oral supplements  Hyponatremia - Continue to volume depletion, and currently improved with IV fluid, sodium was 129 on admission, today is 134  Hx Hypothyroidism - Continue with Synthroid  Thrombocytopenia - Stable, most likely related to alcohol abuse   Alcohol dependence - No evidence of withdrawals during hospital stay, counseled at length, will discharge with thiamine, like acid and multivitamins .  Sick sinus syndrome status post pacemaker - Followed by EP as an outpatient(questionable vegetation versus fibrin clot last admission in February, but no evidence of infection then, so there was no indication for antibiotic)    Discharge Condition:  Stable   Follow UP  Follow-up Information    Lora Paula, MD Follow up in 1 week(s).   Specialty:  Family Medicine Contact information: [address] [place] [address] [phone]             Discharge Instructions  and  Discharge Medications     Discharge Instructions    Discharge instructions    Complete by:  As directed    Follow with Primary MD Lora Paula, MD in 7 days   Get CBC, CMP,checked  by Primary MD next visit.    Activity: As tolerated with Full fall precautions use walker/cane & assistance as needed   Disposition Home    Diet: Heart Healthy , Carb Modified .  For Heart failure patients - Check your Weight same time everyday, if you gain over 2 pounds, or you develop in leg swelling, experience more shortness of breath or chest pain, call your Primary MD immediately. Follow Cardiac Low Salt Diet and 1.5 lit/day fluid restriction.   On your next visit with your primary care physician please Get Medicines reviewed and  adjusted.   Please request your Prim.MD to go over all Hospital Tests and Procedure/Radiological results at the follow up, please get all Hospital records sent to your Prim MD by signing hospital release before you go home.   If you experience worsening of your admission symptoms, develop shortness of breath, life threatening emergency, suicidal or homicidal thoughts you must seek medical attention immediately by calling 911 or calling your MD immediately  if symptoms less severe.  You Must read complete instructions/literature along with all the possible adverse reactions/side effects for all the Medicines you take and that have been prescribed to you. Take any new Medicines after you have completely understood and accpet all the possible adverse reactions/side effects.   Do not drive, operating heavy machinery, perform activities at heights, swimming or participation in water activities or provide baby sitting services if your were admitted for syncope or siezures until  you have seen by Primary MD or a Neurologist and advised to do so again.  Do not drive when taking Pain medications.    Do not take more than prescribed Pain, Sleep and Anxiety Medications  Special Instructions: If you have smoked or chewed Tobacco  in the last 2 yrs please stop smoking, stop any regular Alcohol  and or any Recreational drug use.  Wear Seat belts while driving.   Please note  You were cared for by a hospitalist during your hospital stay. If you have any questions about your discharge medications or the care you received while you were in the hospital after you are discharged, you can call the unit and asked to speak with the hospitalist on call if the hospitalist that took care of you is not available. Once you are discharged, your primary care physician will handle any further medical issues. Please note that NO REFILLS for any discharge medications will be authorized once you are discharged, as it is  imperative that you return to your primary care physician (or establish a relationship with a primary care physician if you do not have one) for your aftercare needs so that they can reassess your need for medications and monitor your lab values.   Increase activity slowly    Complete by:  As directed        Medication List    STOP taking these medications   gabapentin 100 MG capsule Commonly known as:  NEURONTIN   ketotifen 0.025 % ophthalmic solution Commonly known as:  ZADITOR   sitaGLIPtin 100 MG tablet Commonly known as:  JANUVIA   zolpidem 10 MG tablet Commonly known as:  AMBIEN     TAKE these medications   amLODipine 10 MG tablet Commonly known as:  NORVASC Take 1 tablet (10 mg total) by mouth daily. Start taking on:  10/19/2016   DULoxetine 30 MG capsule Commonly known as:  CYMBALTA Take 1 capsule (30 mg total) by mouth daily.   exenatide 10 MCG/0.04ML Sopn injection Commonly known as:  BYETTA Inject 0.04 mLs (10 mcg total) into the skin 2 (two) times daily with a meal.   folic acid 1 MG tablet Commonly known as:  FOLVITE Take 1 tablet (1 mg total) by mouth daily. Start taking on:  10/19/2016   insulin aspart 100 UNIT/ML injection Commonly known as:  novoLOG Inject 30 Units into the skin 3 (three) times daily with meals.   Insulin Glargine 100 UNIT/ML Solostar Pen Commonly known as:  LANTUS SOLOSTAR Inject 30 Units into the skin daily at 10 pm.   levothyroxine 50 MCG tablet Commonly known as:  SYNTHROID, LEVOTHROID Take 50 mcg by mouth daily before breakfast.   multivitamin with minerals Tabs tablet Take 1 tablet by mouth daily. Start taking on:  10/19/2016   ondansetron 4 MG tablet Commonly known as:  ZOFRAN Take 1 tablet (4 mg total) by mouth every 8 (eight) hours as needed for nausea or vomiting.   potassium chloride 10 MEQ tablet Commonly known as:  K-DUR Please take 40 meq oral daily for 3 days, then 20 meq oral daily for 2 weeks.    rivaroxaban 20 MG Tabs tablet Commonly known as:  XARELTO Take 1 tablet (20 mg total) by mouth daily with supper.   thiamine 100 MG tablet Take 1 tablet (100 mg total) by mouth daily. Start taking on:  10/19/2016         Diet and Activity recommendation: See Discharge Instructions above  Consults obtained -  None   Major procedures and Radiology Reports - PLEASE review detailed and final reports for all details, in brief -      Dg Chest 2 View  Result Date: 10/17/2016 CLINICAL DATA:  Worsening chest pain EXAM: CHEST  2 VIEW COMPARISON:  10/14/2016 FINDINGS: Cardiac shadow is stable. Pacing device is noted and stable in appearance. The lungs are well aerated bilaterally. No focal infiltrate or sizable effusion is seen. No bony abnormality is noted. IMPRESSION: No active cardiopulmonary disease. Electronically Signed   By: Alcide Clever M.D.   On: 10/17/2016 10:56   Ct Head Wo Contrast  Result Date: 10/17/2016 CLINICAL DATA:  Dizziness with nausea and vomiting 3 days. EXAM: CT HEAD WITHOUT CONTRAST TECHNIQUE: Contiguous axial images were obtained from the base of the skull through the vertex without intravenous contrast. COMPARISON:  02/17/2016 FINDINGS: Brain: The ventricles, cisterns and other CSF spaces are within normal. There is chronic ischemic microvascular disease unchanged. No mass, mass effect, shift of midline structures or acute hemorrhage. Vascular: Within normal. Skull: Within normal. Sinuses/Orbits: Within normal. Other: Subtle soft tissue swelling over the left supraorbital region. Subtle small foci of air along the lateral aspect of the sella and just lateral to the sphenoid sinus bilaterally which may be related to recent trauma, although no evidence of fracture. IMPRESSION: No acute intracranial findings. Chronic ischemic microvascular disease. Subtle soft tissue swelling over the left supraorbital region. Subtle foci of air adjacent the sella and sphenoid sinus  which may be the result of trauma, although no evidence of fracture. Electronically Signed   By: Elberta Fortis M.D.   On: 10/17/2016 15:57   Ct Angio Chest/abd/pel For Dissection W And/or Wo Contrast  Result Date: 10/14/2016 CLINICAL DATA:  Pain and emesis EXAM: CT ANGIOGRAPHY CHEST, ABDOMEN AND PELVIS TECHNIQUE: Multidetector CT imaging through the chest, abdomen and pelvis was performed using the standard protocol during bolus administration of intravenous contrast. Multiplanar reconstructed images and MIPs were obtained and reviewed to evaluate the vascular anatomy. CONTRAST:  100 mL Isovue 370. COMPARISON:  02/18/2016 FINDINGS: CTA CHEST FINDINGS Cardiovascular: The thoracic aorta is well visualized within normal branching pattern. No evidence of aneurysmal dilatation or dissection is seen. No coronary calcifications are noted. The cardiac structures are within normal limits. The pulmonary artery as visualized is within normal limits. Mediastinum/Nodes: The thoracic inlet is unremarkable. No hilar, mediastinal or axillary lymphadenopathy is identified. Lungs/Pleura: The lungs are well aerated bilaterally. A tiny 3 mm nodule is noted in the right upper lobe best seen on image number 12 of series 11. No other focal nodules are seen. Musculoskeletal: No chest wall abnormality. No acute or significant osseous findings. Review of the MIP images confirms the above findings. CTA ABDOMEN AND PELVIS FINDINGS VASCULAR Aorta: Well visualized without aneurysmal dilatation. Mild calcifications are noted at the level of the aortic bifurcation. Celiac: Widely patent SMA: Widely patent Renals: Single bilaterally and patent IMA: Widely patent Iliacs: Mild calcific changes are noted although the bar injuries are patent. No aneurysmal dilatation is seen. Veins: Within normal limits. Review of the MIP images confirms the above findings. NON-VASCULAR Hepatobiliary: Diffusely fatty infiltrated. The gallbladder is within normal  limits. Pancreas: Unremarkable. No pancreatic ductal dilatation or surrounding inflammatory changes. Spleen: Normal in size without focal abnormality. Adrenals/Urinary Tract: The right adrenal gland demonstrates a 17 mm hypodensity consistent with an adenoma. The left adrenal gland it is hypertrophic. Kidneys demonstrate no renal calculi or obstructive changes. No focal mass  lesion is noted. The bladder is well distended Stomach/Bowel: Stomach is within normal limits. Appendix appears normal. No evidence of bowel wall thickening, distention, or inflammatory changes. Lymphatic: No significant lymphadenopathy is noted. Reproductive: Prostate is unremarkable. Other: No abdominal wall hernia or abnormality. No abdominopelvic ascites. Musculoskeletal: No acute or significant osseous findings. Review of the MIP images confirms the above findings. IMPRESSION: No acute arterial abnormality is noted. No findings to correspond with the given clinical history are noted. Fatty liver. Tiny right apical nodule. No follow-up needed if patient is low-risk. Non-contrast chest CT can be considered in 12 months if patient is high-risk. This recommendation follows the consensus statement: Guidelines for Management of Incidental Pulmonary Nodules Detected on CT Images: From the Fleischner Society 2017; Radiology 2017; 284:228-243. Electronically Signed   By: Alcide CleverMark  Lukens M.D.   On: 10/14/2016 12:52    Micro Results     No results found for this or any previous visit (from the past 240 hour(s)).     Today   Subjective:   Patrick Small today has no headache,no chest or  abdominal pain, reports is feeling much better today, denies any nausea or vomiting, tolerating soft diet, feels much better wants to go home today.   Objective:   Blood pressure (!) 139/98, pulse (!) 59, temperature 98.1 F (36.7 C), temperature source Oral, resp. rate 18, height 6\' 1"  (1.854 m), weight 81.6 kg (180 lb), SpO2 100  %.   Intake/Output Summary (Last 24 hours) at 10/18/16 1242 Last data filed at 10/18/16 1100  Gross per 24 hour  Intake              685 ml  Output             1850 ml  Net            -1165 ml    Exam Awake Alert, Oriented x 3, No new F.N deficits, Normal affect [place].AT,PERRAL Supple Neck,No JVD, No cervical lymphadenopathy appriciated.  Symmetrical Chest wall movement, Good air movement bilaterally, CTAB RRR,No Gallops,Rubs or new Murmurs, No Parasternal Heave +ve B.Sounds, Abd Soft, Non tender, No organomegaly appriciated, No rebound -guarding or rigidity. No Cyanosis, Clubbing or edema, No new Rash or bruise  Data Review   CBC w Diff: Lab Results  Component Value Date   WBC 6.4 10/18/2016   HGB 12.0 (L) 10/18/2016   HCT 32.9 (L) 10/18/2016   PLT 94 (L) 10/18/2016   LYMPHOPCT 25 10/17/2016   MONOPCT 8 10/17/2016   EOSPCT 0 10/17/2016   BASOPCT 0 10/17/2016    CMP: Lab Results  Component Value Date   NA 134 (L) 10/18/2016   K 2.6 (LL) 10/18/2016   CL 96 (L) 10/18/2016   CO2 29 10/18/2016   BUN 20 10/18/2016   CREATININE 1.47 (H) 10/18/2016   CREATININE 0.79 03/04/2016   PROT 8.7 (H) 10/17/2016   ALBUMIN 4.8 10/17/2016   BILITOT 2.1 (H) 10/17/2016   ALKPHOS 306 (H) 10/17/2016   AST 359 (H) 10/17/2016   ALT 154 (H) 10/17/2016  .   Total Time in preparing paper work, data evaluation and todays exam - 35 minutes  Lelani Garnett M.D on 10/18/2016 at 12:42 PM  [facility]   Office  [phone]

## 2016-10-18 NOTE — Progress Notes (Signed)
CRITICAL VALUE ALERT  Critical value received:  K+2.6  Date of notification:  10/18/16  Time of notification:  0650  Critical value read back: Yes   Nurse who received alert: Governor SpeckingJerry Vira Chaplin RN  MD notified (1st page):  I. Izola PriceMyers (Floor Coverage)   Time of first page:  [phone]  MD notified (2nd page):  Time of second page:  Responding MD:    Time MD responded:

## 2016-10-19 LAB — HEMOGLOBIN A1C
HEMOGLOBIN A1C: 6.2 % — AB (ref 4.8–5.6)
MEAN PLASMA GLUCOSE: 131 mg/dL

## 2016-10-27 ENCOUNTER — Encounter: Payer: Self-pay | Admitting: Family Medicine

## 2016-10-27 ENCOUNTER — Telehealth: Payer: Self-pay | Admitting: Family Medicine

## 2016-10-27 NOTE — Telephone Encounter (Signed)
Please inform patient,  Personal care services form received and filled out Since last OV was > 90 days ago Will need to wait to his HFU appy on 11/04/2016 to complete for and [facility] [place] at [phone] or [phone] (toll free)

## 2016-10-28 NOTE — Telephone Encounter (Signed)
Pt was called on 10/31 and a VM was left informing pt of paperwork being filled out on next OV.

## 2016-11-04 ENCOUNTER — Ambulatory Visit: Payer: Medicaid Other | Attending: Family Medicine | Admitting: Family Medicine

## 2016-11-04 ENCOUNTER — Encounter: Payer: Self-pay | Admitting: Family Medicine

## 2016-11-04 VITALS — BP 126/82 | HR 60 | Temp 98.7°F | Ht 73.0 in | Wt 192.8 lb

## 2016-11-04 DIAGNOSIS — H538 Other visual disturbances: Secondary | ICD-10-CM | POA: Insufficient documentation

## 2016-11-04 DIAGNOSIS — F329 Major depressive disorder, single episode, unspecified: Secondary | ICD-10-CM | POA: Insufficient documentation

## 2016-11-04 DIAGNOSIS — I10 Essential (primary) hypertension: Secondary | ICD-10-CM

## 2016-11-04 DIAGNOSIS — F32A Depression, unspecified: Secondary | ICD-10-CM

## 2016-11-04 DIAGNOSIS — Z7901 Long term (current) use of anticoagulants: Secondary | ICD-10-CM | POA: Insufficient documentation

## 2016-11-04 DIAGNOSIS — E1165 Type 2 diabetes mellitus with hyperglycemia: Secondary | ICD-10-CM | POA: Diagnosis not present

## 2016-11-04 DIAGNOSIS — E039 Hypothyroidism, unspecified: Secondary | ICD-10-CM | POA: Diagnosis not present

## 2016-11-04 DIAGNOSIS — Z794 Long term (current) use of insulin: Secondary | ICD-10-CM | POA: Diagnosis not present

## 2016-11-04 DIAGNOSIS — IMO0002 Reserved for concepts with insufficient information to code with codable children: Secondary | ICD-10-CM

## 2016-11-04 DIAGNOSIS — E118 Type 2 diabetes mellitus with unspecified complications: Secondary | ICD-10-CM

## 2016-11-04 DIAGNOSIS — G4700 Insomnia, unspecified: Secondary | ICD-10-CM | POA: Insufficient documentation

## 2016-11-04 DIAGNOSIS — K7010 Alcoholic hepatitis without ascites: Secondary | ICD-10-CM | POA: Diagnosis not present

## 2016-11-04 DIAGNOSIS — F17210 Nicotine dependence, cigarettes, uncomplicated: Secondary | ICD-10-CM | POA: Insufficient documentation

## 2016-11-04 DIAGNOSIS — E1142 Type 2 diabetes mellitus with diabetic polyneuropathy: Secondary | ICD-10-CM

## 2016-11-04 LAB — CBC
HEMATOCRIT: 34.2 % — AB (ref 38.5–50.0)
Hemoglobin: 11.4 g/dL — ABNORMAL LOW (ref 13.2–17.1)
MCH: 33 pg (ref 27.0–33.0)
MCHC: 33.3 g/dL (ref 32.0–36.0)
MCV: 99.1 fL (ref 80.0–100.0)
MPV: 10.2 fL (ref 7.5–12.5)
PLATELETS: 196 10*3/uL (ref 140–400)
RBC: 3.45 MIL/uL — AB (ref 4.20–5.80)
RDW: 14.6 % (ref 11.0–15.0)
WBC: 4.7 10*3/uL (ref 3.8–10.8)

## 2016-11-04 LAB — COMPLETE METABOLIC PANEL WITH GFR
ALT: 29 U/L (ref 9–46)
AST: 28 U/L (ref 10–35)
Albumin: 3.9 g/dL (ref 3.6–5.1)
Alkaline Phosphatase: 150 U/L — ABNORMAL HIGH (ref 40–115)
BUN: 5 mg/dL — ABNORMAL LOW (ref 7–25)
CHLORIDE: 102 mmol/L (ref 98–110)
CO2: 26 mmol/L (ref 20–31)
Calcium: 9 mg/dL (ref 8.6–10.3)
Creat: 0.89 mg/dL (ref 0.70–1.33)
Glucose, Bld: 157 mg/dL — ABNORMAL HIGH (ref 65–99)
Potassium: 3.6 mmol/L (ref 3.5–5.3)
Sodium: 136 mmol/L (ref 135–146)
Total Bilirubin: 0.6 mg/dL (ref 0.2–1.2)
Total Protein: 6.5 g/dL (ref 6.1–8.1)

## 2016-11-04 LAB — TSH: TSH: 4.18 m[IU]/L (ref 0.40–4.50)

## 2016-11-04 LAB — GLUCOSE, POCT (MANUAL RESULT ENTRY): POC Glucose: 168 mg/dl — AB (ref 70–99)

## 2016-11-04 MED ORDER — ONDANSETRON HCL 4 MG PO TABS: 4.0000 mg | ORAL_TABLET | Freq: Three times a day (TID) | ORAL | 1 refills | Status: DC | PRN

## 2016-11-04 MED ORDER — AMLODIPINE BESYLATE 10 MG PO TABS: 10.0000 mg | ORAL_TABLET | Freq: Every day | ORAL | 11 refills | Status: DC

## 2016-11-04 MED ORDER — ZOLPIDEM TARTRATE 10 MG PO TABS: 10.0000 mg | ORAL_TABLET | Freq: Every evening | ORAL | 2 refills | Status: DC | PRN

## 2016-11-04 MED ORDER — DULOXETINE HCL 30 MG PO CPEP: 30.0000 mg | ORAL_CAPSULE | Freq: Every day | ORAL | 5 refills | Status: DC

## 2016-11-04 NOTE — Progress Notes (Signed)
Subjective:  Patient ID: Patrick Small, male    DOB: 05/30/1961  Age: 55 y.o. MRN: [id number]  CC: Diabetes and Hypertension   HPI Patrick Small presents for   1. CHRONIC HYPERTENSION: taking Norvasc. No HA, CP or SOB. No swelling.   2. Chronic diabetes: compliant with regimen. He has numbness and pain in feet. Some blurry vision. No low sugars. Due for eye exam. Foot exam done today.  3. HFU: nausea and vomiting from alcoholic hepatitis. He was hospitalized from 10/20-10/21 for nausea and vomiting. He has elevated liver enzymes. Normal lipase. This occurred in setting of him stopping alcohol. He reports that he drank 12-24 oz per night and stopped 3 weeks ago. He has insomnia. He still has some nausea. No emesis for past week.   4. Depression: chronic. With irritability and insomnia. He is not taking Cymbalta. He denies suicidal ideation.    Social History  Substance Use Topics  . Smoking status: Current Every Day Smoker    Packs/day: 6.00    Types: Cigarettes  . Smokeless tobacco: Never Used  . Alcohol use 0.0 oz/week     Comment: only on weekends    Outpatient Medications Prior to Visit  Medication Sig Dispense Refill  . amLODipine (NORVASC) 10 MG tablet Take 1 tablet (10 mg total) by mouth daily. 30 tablet 0  . DULoxetine (CYMBALTA) 30 MG capsule Take 1 capsule (30 mg total) by mouth daily. 30 capsule 2  . exenatide (BYETTA) 10 MCG/0.04ML SOPN injection Inject 0.04 mLs (10 mcg total) into the skin 2 (two) times daily with a meal. 2.4 mL 5  . folic acid (FOLVITE) 1 MG tablet Take 1 tablet (1 mg total) by mouth daily. 30 tablet 0  . insulin aspart (NOVOLOG) 100 UNIT/ML injection Inject 30 Units into the skin 3 (three) times daily with meals.    . Insulin Glargine (LANTUS SOLOSTAR) 100 UNIT/ML Solostar Pen Inject 30 Units into the skin daily at 10 pm. 15 mL 5  . levothyroxine (SYNTHROID, LEVOTHROID) 50 MCG tablet Take 50 mcg by mouth daily before breakfast.    . Multiple  Vitamin (MULTIVITAMIN WITH MINERALS) TABS tablet Take 1 tablet by mouth daily. 30 tablet 0  . ondansetron (ZOFRAN) 4 MG tablet Take 1 tablet (4 mg total) by mouth every 8 (eight) hours as needed for nausea or vomiting. 12 tablet 0  . potassium chloride (K-DUR) 10 MEQ tablet Please take 40 meq oral daily for 3 days, then 20 meq oral daily for 2 weeks. 40 tablet 0  . rivaroxaban (XARELTO) 20 MG TABS tablet Take 1 tablet (20 mg total) by mouth daily with supper. 30 tablet 2  . thiamine 100 MG tablet Take 1 tablet (100 mg total) by mouth daily. 30 tablet 0   No facility-administered medications prior to visit.     ROS Review of Systems  Constitutional: Negative for chills, fatigue, fever and unexpected weight change.  Eyes: Positive for visual disturbance.  Respiratory: Negative for cough and shortness of breath.   Cardiovascular: Negative for chest pain, palpitations and leg swelling.  Gastrointestinal: Positive for nausea. Negative for abdominal pain, blood in stool, constipation, diarrhea and vomiting.  Endocrine: Negative for polydipsia, polyphagia and polyuria.  Musculoskeletal: Positive for arthralgias and joint swelling. Negative for back pain, gait problem, myalgias and neck pain.  Skin: Negative for rash.  Allergic/Immunologic: Negative for immunocompromised state.  Hematological: Negative for adenopathy. Does not bruise/bleed easily.  Psychiatric/Behavioral: Positive for dysphoric mood and sleep disturbance. Negative  for suicidal ideas. The patient is nervous/anxious.     Objective:  BP (!) 141/87 (BP Location: Right Arm, Patient Position: Sitting, Cuff Size: Small)   Pulse 60   Temp 98.7 F (37.1 C) (Oral)   Ht 6\' 1"  (1.854 m)   Wt 192 lb 12.8 oz (87.5 kg)   SpO2 99%   BMI 25.44 kg/m   BP/Weight 11/04/2016 10/18/2016 10/17/2016  Systolic BP 126 139 -  Diastolic BP 82 98 -  Wt. (Lbs) 192.8 - 180  BMI 25.44 - 23.75    Physical Exam  Constitutional: He appears  well-developed and well-nourished. No distress.  HENT:  Head: Normocephalic and atraumatic.  Neck: Normal range of motion. Neck supple.  Cardiovascular: Normal rate, regular rhythm, normal heart sounds and intact distal pulses.   Pulmonary/Chest: Effort normal and breath sounds normal.  Abdominal: Soft. Bowel sounds are normal. He exhibits no distension and no mass. There is no tenderness. There is no rebound and no guarding.  Musculoskeletal: He exhibits no edema.  Neurological: He is alert.  Skin: Skin is warm and dry. No rash noted. No erythema.  Psychiatric: He has a normal mood and affect.   Lab Results  Component Value Date   HGBA1C 6.2 (H) 10/18/2016   CBG 168  Dep[facility] screen [facility] 2/9 11/04/2016 04/29/2016 03/04/2016  Decreased Interest 2 0 1  Down, Depressed, Hopeless 2 3 2   PHQ - 2 Score 4 3 3   Altered sleeping 3 3 3   Tired, decreased energy 1 3 1   Change in appetite 1 - 1  Feeling bad or failure about yourself  3 3 3   Trouble concentrating 1 1 2   Moving slowly or fidgety/restless 1 0 1  Suicidal thoughts 0 0 0  PHQ-9 Score 14 13 14    GAD 7 : Generalized Anxiety Score 11/04/2016 04/29/2016 03/04/2016  Nervous, Anxious, on Edge 2 1 1   Control/stop worrying 3 2 3   Worry too much - different things 3 3 3   Trouble relaxing 2 3 2   Restless 1 0 2  Easily annoyed or irritable 2 3 2   Afraid - awful might happen 3 3 3   Total GAD 7 Score 16 15 16       Assessment & Plan:   Jodi was seen today for diabetes and hypertension.  Diagnoses and all orders for this visit:  Uncontrolled type 2 diabetes mellitus with complication, with long-term current use of insulin (HCC) -     POCT glucose (manual entry) -     Ambulatory referral to Ophthalmology  Depression, unspecified depression type -     DULoxetine (CYMBALTA) 30 MG capsule; Take 1 capsule (30 mg total) by mouth daily.  Essential hypertension -     amLODipine (NORVASC) 10 MG tablet; Take 1 tablet (10 mg total) by mouth  daily.  Alcoholic hepatitis without ascites -     Discontinue: ondansetron (ZOFRAN) 4 MG tablet; Take 1 tablet (4 mg total) by mouth every 8 (eight) hours as needed for nausea or vomiting. -     COMPLETE METABOLIC PANEL WITH GFR -     ondansetron (ZOFRAN) 4 MG tablet; Take 1 tablet (4 mg total) by mouth every 8 (eight) hours as needed for nausea or vomiting. -     CBC  Hypothyroidism, unspecified type -     TSH  Insomnia, unspecified type -     zolpidem (AMBIEN) 10 MG tablet; Take 1 tablet (10 mg total) by mouth at bedtime as needed for sleep.  No orders of the defined types were placed in this encounter.   Follow-up: Return in about 3 months (around 02/04/2017) for HTN and DM2 .   Dessa Phi MD

## 2016-11-04 NOTE — Assessment & Plan Note (Signed)
Restart cymbalta along with ambien for insomnia

## 2016-11-04 NOTE — Assessment & Plan Note (Signed)
Controlled. Continue current regimen. 

## 2016-11-04 NOTE — Patient Instructions (Addendum)
Patrick Small was seen today for diabetes and hypertension.  Diagnoses and all orders for this visit:  Uncontrolled type 2 diabetes mellitus with complication, with long-term current use of insulin (HCC) -     POCT glucose (manual entry) -     Ambulatory referral to Ophthalmology  Depression, unspecified depression type -     DULoxetine (CYMBALTA) 30 MG capsule; Take 1 capsule (30 mg total) by mouth daily.  Essential hypertension -     amLODipine (NORVASC) 10 MG tablet; Take 1 tablet (10 mg total) by mouth daily.  Alcoholic hepatitis without ascites -     ondansetron (ZOFRAN) 4 MG tablet; Take 1 tablet (4 mg total) by mouth every 8 (eight) hours as needed for nausea or vomiting. -     COMPLETE METABOLIC PANEL WITH GFR  Hypothyroidism, unspecified type -     TSH   F/u in 3 months for HTN and DM2   Dr. Armen PickupFunches

## 2016-11-04 NOTE — Progress Notes (Signed)
Pt is here today to follow up on HTN and diabetes.  Pt declined flu shot. 

## 2016-11-04 NOTE — Assessment & Plan Note (Signed)
Controlled now Continue current regimen

## 2016-11-07 ENCOUNTER — Telehealth: Payer: Self-pay

## 2016-11-07 NOTE — Telephone Encounter (Signed)
Pt was called and a VM was left informing pt to return phone call for lab results. 

## 2016-11-07 NOTE — Telephone Encounter (Signed)
Office visit complete

## 2016-11-12 ENCOUNTER — Telehealth: Payer: Self-pay

## 2016-11-12 NOTE — Telephone Encounter (Signed)
Pt was called and informed of lab results. 

## 2016-11-13 ENCOUNTER — Telehealth: Payer: Self-pay | Admitting: Family Medicine

## 2016-11-13 NOTE — Telephone Encounter (Signed)
Patient called the office to request medication refill for Tru test strips. [facility]   Thank you.

## 2016-11-14 MED ORDER — GLUCOSE BLOOD VI STRP: ORAL_STRIP | 12 refills | Status: DC

## 2016-11-14 NOTE — Telephone Encounter (Signed)
Test strips refilled

## 2016-12-11 ENCOUNTER — Other Ambulatory Visit: Payer: Self-pay | Admitting: Family Medicine

## 2016-12-11 DIAGNOSIS — Z794 Long term (current) use of insulin: Principal | ICD-10-CM

## 2016-12-11 DIAGNOSIS — E1165 Type 2 diabetes mellitus with hyperglycemia: Principal | ICD-10-CM

## 2016-12-11 DIAGNOSIS — E118 Type 2 diabetes mellitus with unspecified complications: Secondary | ICD-10-CM

## 2016-12-11 DIAGNOSIS — IMO0002 Reserved for concepts with insufficient information to code with codable children: Secondary | ICD-10-CM

## 2016-12-11 MED ORDER — LEVOTHYROXINE SODIUM 50 MCG PO TABS: 50.0000 ug | ORAL_TABLET | Freq: Every day | ORAL | 5 refills | Status: DC

## 2016-12-11 MED ORDER — INSULIN ASPART 100 UNIT/ML ~~LOC~~ SOLN: 30.0000 [IU] | Freq: Three times a day (TID) | SUBCUTANEOUS | 5 refills | Status: DC

## 2016-12-11 MED ORDER — INSULIN GLARGINE 100 UNIT/ML SOLOSTAR PEN: 30.0000 [IU] | PEN_INJECTOR | Freq: Every day | SUBCUTANEOUS | 5 refills | Status: DC

## 2016-12-11 MED ORDER — EXENATIDE 10 MCG/0.04ML ~~LOC~~ SOPN: 10.0000 ug | PEN_INJECTOR | Freq: Two times a day (BID) | SUBCUTANEOUS | 5 refills | Status: DC

## 2016-12-15 ENCOUNTER — Other Ambulatory Visit: Payer: Self-pay | Admitting: Family Medicine

## 2016-12-15 DIAGNOSIS — Z86718 Personal history of other venous thrombosis and embolism: Secondary | ICD-10-CM

## 2016-12-15 MED ORDER — RIVAROXABAN 20 MG PO TABS: 20.0000 mg | ORAL_TABLET | Freq: Every day | ORAL | 5 refills | Status: DC

## 2017-01-06 ENCOUNTER — Other Ambulatory Visit: Payer: Self-pay | Admitting: *Deleted

## 2017-01-06 MED ORDER — FOLIC ACID 1 MG PO TABS: 1.0000 mg | ORAL_TABLET | Freq: Every day | ORAL | 0 refills | Status: DC

## 2017-01-30 ENCOUNTER — Ambulatory Visit: Payer: Medicaid Other | Attending: Family Medicine | Admitting: Family Medicine

## 2017-01-30 ENCOUNTER — Encounter: Payer: Self-pay | Admitting: Family Medicine

## 2017-01-30 VITALS — BP 164/115 | HR 78 | Temp 98.4°F | Resp 16 | Wt 188.6 lb

## 2017-01-30 DIAGNOSIS — Z794 Long term (current) use of insulin: Secondary | ICD-10-CM

## 2017-01-30 DIAGNOSIS — I10 Essential (primary) hypertension: Secondary | ICD-10-CM | POA: Diagnosis not present

## 2017-01-30 DIAGNOSIS — J029 Acute pharyngitis, unspecified: Secondary | ICD-10-CM | POA: Diagnosis present

## 2017-01-30 DIAGNOSIS — F17210 Nicotine dependence, cigarettes, uncomplicated: Secondary | ICD-10-CM | POA: Insufficient documentation

## 2017-01-30 DIAGNOSIS — E1142 Type 2 diabetes mellitus with diabetic polyneuropathy: Secondary | ICD-10-CM

## 2017-01-30 DIAGNOSIS — R1319 Other dysphagia: Secondary | ICD-10-CM | POA: Insufficient documentation

## 2017-01-30 DIAGNOSIS — R1310 Dysphagia, unspecified: Secondary | ICD-10-CM | POA: Diagnosis not present

## 2017-01-30 DIAGNOSIS — Z7901 Long term (current) use of anticoagulants: Secondary | ICD-10-CM | POA: Insufficient documentation

## 2017-01-30 LAB — CBC
HCT: 37.7 % — ABNORMAL LOW (ref 38.5–50.0)
HEMOGLOBIN: 13 g/dL — AB (ref 13.2–17.1)
MCH: 33.4 pg — ABNORMAL HIGH (ref 27.0–33.0)
MCHC: 34.5 g/dL (ref 32.0–36.0)
MCV: 96.9 fL (ref 80.0–100.0)
MPV: 10.6 fL (ref 7.5–12.5)
Platelets: 165 10*3/uL (ref 140–400)
RBC: 3.89 MIL/uL — ABNORMAL LOW (ref 4.20–5.80)
RDW: 14.1 % (ref 11.0–15.0)
WBC: 6.1 10*3/uL (ref 3.8–10.8)

## 2017-01-30 LAB — BASIC METABOLIC PANEL WITH GFR
BUN: 9 mg/dL (ref 7–25)
CALCIUM: 9.8 mg/dL (ref 8.6–10.3)
CO2: 28 mmol/L (ref 20–31)
CREATININE: 0.8 mg/dL (ref 0.70–1.33)
Chloride: 96 mmol/L — ABNORMAL LOW (ref 98–110)
GFR, Est African American: >89 mL/min (ref >=60)
Glucose, Bld: 264 mg/dL — ABNORMAL HIGH (ref 65–99)
Potassium: 3.7 mmol/L (ref 3.5–5.3)
Sodium: 131 mmol/L — ABNORMAL LOW (ref 135–146)

## 2017-01-30 LAB — GLUCOSE, POCT (MANUAL RESULT ENTRY): POC Glucose: 276 mg/dl — AB (ref 70–99)

## 2017-01-30 LAB — HEMOCCULT GUIAC POC 1CARD (OFFICE): Fecal Occult Blood, POC: NEGATIVE

## 2017-01-30 MED ORDER — LOSARTAN POTASSIUM 50 MG PO TABS
50.0000 mg | ORAL_TABLET | Freq: Every day | ORAL | 3 refills | Status: DC
Start: 2017-01-30 — End: 2017-02-20

## 2017-01-30 MED ORDER — CLONIDINE HCL 0.1 MG PO TABS
0.1000 mg | ORAL_TABLET | Freq: Once | ORAL | Status: AC
  Administered 2017-01-30: 0.1 mg via ORAL

## 2017-01-30 MED ORDER — PANTOPRAZOLE SODIUM 40 MG PO TBEC: 40.0000 mg | DELAYED_RELEASE_TABLET | Freq: Every day | ORAL | 3 refills | Status: DC

## 2017-01-30 NOTE — Progress Notes (Signed)
Subjective:  Patient ID: Patrick Small, male    DOB: 04/23/1961  Age: 56 y.o. MRN: [id number]  CC: Sore Throat   HPI Patrick Small presents for    1. Swallowing problem: for past week. Regurgitating liquids. Having pain with swallowing as well. Denies heart burn or reflux. Has hx of alcohol abuse and smoking. He drinks 2 shot of Brandy every 2-3 weeks per reports. Denies blood in stools.    2. CHRONIC HYPERTENSION: taking Norvasc. No HA, CP or SOB. No swelling.   3. Chronic diabetes: compliant with regimen. He has numbness and pain in feet. Some blurry vision. No low sugars. Drinking soda prior to and during office visit.   Social History  Substance Use Topics  . Smoking status: Current Every Day Smoker    Packs/day: 6.00    Types: Cigarettes  . Smokeless tobacco: Never Used  . Alcohol use 0.0 oz/week     Comment: only on weekends    Outpatient Medications Prior to Visit  Medication Sig Dispense Refill  . amLODipine (NORVASC) 10 MG tablet Take 1 tablet (10 mg total) by mouth daily. 30 tablet 11  . DULoxetine (CYMBALTA) 30 MG capsule Take 1 capsule (30 mg total) by mouth daily. 30 capsule 5  . exenatide (BYETTA) 10 MCG/0.04ML SOPN injection Inject 0.04 mLs (10 mcg total) into the skin 2 (two) times daily with a meal. 2.4 mL 5  . folic acid (FOLVITE) 1 MG tablet Take 1 tablet (1 mg total) by mouth daily. 30 tablet 0  . glucose blood (TRUE METRIX BLOOD GLUCOSE TEST) test strip Use as instructed 100 each 12  . insulin aspart (NOVOLOG) 100 UNIT/ML injection Inject 30 Units into the skin 3 (three) times daily with meals. 10 mL 5  . Insulin Glargine (LANTUS SOLOSTAR) 100 UNIT/ML Solostar Pen Inject 30 Units into the skin daily at 10 pm. 15 mL 5  . levothyroxine (SYNTHROID, LEVOTHROID) 50 MCG tablet Take 1 tablet (50 mcg total) by mouth daily before breakfast. 30 tablet 5  . Multiple Vitamin (MULTIVITAMIN WITH MINERALS) TABS tablet Take 1 tablet by mouth daily. 30 tablet 0  .  ondansetron (ZOFRAN) 4 MG tablet Take 1 tablet (4 mg total) by mouth every 8 (eight) hours as needed for nausea or vomiting. 30 tablet 1  . potassium chloride (K-DUR) 10 MEQ tablet Please take 40 meq oral daily for 3 days, then 20 meq oral daily for 2 weeks. 40 tablet 0  . rivaroxaban (XARELTO) 20 MG TABS tablet Take 1 tablet (20 mg total) by mouth daily with supper. 30 tablet 5  . thiamine 100 MG tablet Take 1 tablet (100 mg total) by mouth daily. 30 tablet 0  . zolpidem (AMBIEN) 10 MG tablet Take 1 tablet (10 mg total) by mouth at bedtime as needed for sleep. 30 tablet 2   No facility-administered medications prior to visit.     ROS Review of Systems  Constitutional: Negative for chills, fatigue, fever and unexpected weight change.  HENT: Positive for sore throat and trouble swallowing.   Eyes: Positive for visual disturbance.  Respiratory: Negative for cough and shortness of breath.   Cardiovascular: Negative for chest pain, palpitations and leg swelling.  Gastrointestinal: Negative for abdominal pain, blood in stool, constipation, diarrhea, nausea and vomiting.  Endocrine: Negative for polydipsia, polyphagia and polyuria.  Musculoskeletal: Positive for arthralgias and joint swelling. Negative for back pain, gait problem, myalgias and neck pain.  Skin: Negative for rash.  Allergic/Immunologic: Negative for immunocompromised state.  Hematological: Negative for adenopathy. Does not bruise/bleed easily.  Psychiatric/Behavioral: Positive for dysphoric mood and sleep disturbance. Negative for suicidal ideas. The patient is nervous/anxious.     Objective:  BP (!) 164/115   Pulse 78   Temp 98.4 F (36.9 C) (Oral)   Resp 16   Wt 188 lb 9.6 oz (85.5 kg)   SpO2 96%   BMI 24.88 kg/m   BP/Weight 01/30/2017 11/04/2016 10/18/2016  Systolic BP 164 126 139  Diastolic BP 115 82 98  Wt. (Lbs) 188.6 192.8 -  BMI 24.88 25.44 -   Wt Readings from Last 3 Encounters:  01/30/17 188 lb 9.6 oz (85.5  kg)  11/04/16 192 lb 12.8 oz (87.5 kg)  10/17/16 180 lb (81.6 kg)    Physical Exam  Constitutional: He appears well-developed and well-nourished. No distress.  HENT:  Head: Normocephalic and atraumatic.  Neck: Normal range of motion. Neck supple.  Cardiovascular: Normal rate, regular rhythm, normal heart sounds and intact distal pulses.   Pulmonary/Chest: Effort normal and breath sounds normal.  Abdominal: Soft. Bowel sounds are normal. He exhibits no distension and no mass. There is no tenderness. There is no rebound and no guarding.  Genitourinary: Rectal exam shows guaiac negative stool.  Musculoskeletal: He exhibits no edema.  Neurological: He is alert.  Skin: Skin is warm and dry. No rash noted. No erythema.  Psychiatric: He has a normal mood and affect.   Lab Results  Component Value Date   HGBA1C 6.2 (H) 10/18/2016   CBG 276   Depression screen [facility] 2/9 01/30/2017 11/04/2016 04/29/2016  Decreased Interest 3 2 0  Down, Depressed, Hopeless 3 2 3   PHQ - 2 Score 6 4 3   Altered sleeping 3 3 3   Tired, decreased energy 1 1 3   Change in appetite 0 1 -  Feeling bad or failure about yourself  3 3 3   Trouble concentrating 1 1 1   Moving slowly or fidgety/restless 1 1 0  Suicidal thoughts 0 0 0  PHQ-9 Score 15 14 13    GAD 7 : Generalized Anxiety Score 01/30/2017 11/04/2016 04/29/2016 03/04/2016  Nervous, Anxious, on Edge 2 2 1 1   Control/stop worrying 3 3 2 3   Worry too much - different things 3 3 3 3   Trouble relaxing 3 2 3 2   Restless 1 1 0 2  Easily annoyed or irritable 2 2 3 2   Afraid - awful might happen 3 3 3 3   Total GAD 7 Score 17 16 15 16       Assessment & Plan:   Patrick Small was seen today for sore throat.  Diagnoses and all orders for this visit:  Diabetic peripheral neuropathy associated with type 2 diabetes mellitus (HCC) -     POCT glucose (manual entry)  Essential hypertension -     cloNIDine (CATAPRES) tablet 0.1 mg; Take 1 tablet (0.1 mg total) by mouth once. -      losartan (COZAAR) 50 MG tablet; Take 1 tablet (50 mg total) by mouth daily. -     BASIC METABOLIC PANEL WITH GFR  Odynophagia -     pantoprazole (PROTONIX) 40 MG tablet; Take 1 tablet (40 mg total) by mouth daily. -     CBC -     Cancel: Fecal Occult Blood, Guaiac -     DG Esophagus; Future -     Hemoccult - 1 Card (office)  Esophageal dysphagia -     pantoprazole (PROTONIX) 40 MG tablet; Take 1 tablet (40 mg total)  by mouth daily. -     CBC -     Cancel: Fecal Occult Blood, Guaiac -     DG Esophagus; Future -     Hemoccult - 1 Card (office)   No orders of the defined types were placed in this encounter.   Follow-up: Return in about 4 weeks (around 02/27/2017) for HTN and swallowing difficulties .   Dessa Phi MD

## 2017-01-30 NOTE — Patient Instructions (Addendum)
Patrick Small was seen today for sore throat.  Diagnoses and all orders for this visit:  Diabetic peripheral neuropathy associated with type 2 diabetes mellitus (HCC) -     POCT glucose (manual entry)  Essential hypertension -     cloNIDine (CATAPRES) tablet 0.1 mg; Take 1 tablet (0.1 mg total) by mouth once. -     losartan (COZAAR) 50 MG tablet; Take 1 tablet (50 mg total) by mouth daily. -     BASIC METABOLIC PANEL WITH GFR  Odynophagia -     pantoprazole (PROTONIX) 40 MG tablet; Take 1 tablet (40 mg total) by mouth daily. -     CBC -     Cancel: Fecal Occult Blood, Guaiac -     DG Esophagus; Future -     Hemoccult - 1 Card (office)  Esophageal dysphagia -     pantoprazole (PROTONIX) 40 MG tablet; Take 1 tablet (40 mg total) by mouth daily. -     CBC -     Cancel: Fecal Occult Blood, Guaiac -     DG Esophagus; Future -     Hemoccult - 1 Card (office)   Start protonix for swallowing difficulties Avoid alchol and limit smoking as much as possible  You will be called with swallow study results  F/u in 4 weeks for HTN diabetes and swallowing difficulties   Dr. Armen Pickup[employer]ches

## 2017-01-30 NOTE — Progress Notes (Signed)
Pt is in the office today for a sore throat Pt is aware that his sugar is 276  Pt is drinking a faygo soda Pt states he knows what to do when he gets home

## 2017-02-01 DIAGNOSIS — R1319 Other dysphagia: Secondary | ICD-10-CM | POA: Insufficient documentation

## 2017-02-01 DIAGNOSIS — R1310 Dysphagia, unspecified: Secondary | ICD-10-CM | POA: Insufficient documentation

## 2017-02-01 NOTE — Assessment & Plan Note (Signed)
HTN uncontrolled Compliant with norvasc 10 mg daily Add losartan 50 mg daily  Check BMP with GFR

## 2017-02-01 NOTE — Assessment & Plan Note (Signed)
Difficulty and painful swallowing  Heme negative stools Patient high risk given smoking and alcohol abuse hx of ulceration, malignancy, gastritis   Plan: protonix Swallow study

## 2017-02-01 NOTE — Assessment & Plan Note (Signed)
Hyperglycemia Drinking soda Counseled patient to reduce sugar intake in diet

## 2017-02-04 ENCOUNTER — Telehealth: Payer: Self-pay

## 2017-02-04 NOTE — Telephone Encounter (Signed)
Pt was called and informed to return phone call for lab results. 

## 2017-02-09 ENCOUNTER — Telehealth: Payer: Self-pay

## 2017-02-09 NOTE — Telephone Encounter (Signed)
Pt was called and a VM was left informing pt to return phone call for lab results. 

## 2017-02-11 ENCOUNTER — Telehealth: Payer: Self-pay

## 2017-02-11 NOTE — Telephone Encounter (Signed)
Pt was called and a VM was left informing pt to return phone call for lab results. 

## 2017-02-15 ENCOUNTER — Emergency Department (HOSPITAL_COMMUNITY)
Admission: EM | Admit: 2017-02-15 | Discharge: 2017-02-15 | Disposition: A | Discharge status: Home / Self Care | Payer: Medicaid Other | Attending: Emergency Medicine | Admitting: Emergency Medicine

## 2017-02-15 ENCOUNTER — Encounter (HOSPITAL_COMMUNITY): Payer: Self-pay | Admitting: Emergency Medicine

## 2017-02-15 ENCOUNTER — Emergency Department (HOSPITAL_COMMUNITY): Payer: Medicaid Other

## 2017-02-15 DIAGNOSIS — E039 Hypothyroidism, unspecified: Secondary | ICD-10-CM | POA: Diagnosis not present

## 2017-02-15 DIAGNOSIS — I10 Essential (primary) hypertension: Secondary | ICD-10-CM | POA: Diagnosis not present

## 2017-02-15 DIAGNOSIS — F17210 Nicotine dependence, cigarettes, uncomplicated: Secondary | ICD-10-CM | POA: Diagnosis not present

## 2017-02-15 DIAGNOSIS — Z7902 Long term (current) use of antithrombotics/antiplatelets: Secondary | ICD-10-CM | POA: Insufficient documentation

## 2017-02-15 DIAGNOSIS — R112 Nausea with vomiting, unspecified: Secondary | ICD-10-CM

## 2017-02-15 DIAGNOSIS — Z79899 Other long term (current) drug therapy: Secondary | ICD-10-CM | POA: Insufficient documentation

## 2017-02-15 DIAGNOSIS — R109 Unspecified abdominal pain: Secondary | ICD-10-CM | POA: Diagnosis present

## 2017-02-15 DIAGNOSIS — E1140 Type 2 diabetes mellitus with diabetic neuropathy, unspecified: Secondary | ICD-10-CM | POA: Diagnosis not present

## 2017-02-15 DIAGNOSIS — Z794 Long term (current) use of insulin: Secondary | ICD-10-CM | POA: Insufficient documentation

## 2017-02-15 DIAGNOSIS — N390 Urinary tract infection, site not specified: Secondary | ICD-10-CM | POA: Diagnosis not present

## 2017-02-15 LAB — URINALYSIS, ROUTINE W REFLEX MICROSCOPIC
Bilirubin Urine: NEGATIVE
Glucose, UA: >=500 mg/dL — AB
Ketones, ur: 80 mg/dL — AB
Leukocytes, UA: NEGATIVE
NITRITE: NEGATIVE
PROTEIN: 100 mg/dL — AB
SPECIFIC GRAVITY, URINE: 1.024 (ref 1.005–1.030)
pH: 5 (ref 5.0–8.0)

## 2017-02-15 LAB — CBC WITH DIFFERENTIAL/PLATELET
BASOS ABS: 0 10*3/uL (ref 0.0–0.1)
BASOS PCT: 0 %
EOS PCT: 0 %
Eosinophils Absolute: 0 10*3/uL (ref 0.0–0.7)
HEMATOCRIT: 38.7 % — AB (ref 39.0–52.0)
HEMOGLOBIN: 14.5 g/dL (ref 13.0–17.0)
LYMPHS PCT: 20 %
Lymphs Abs: 2.1 10*3/uL (ref 0.7–4.0)
MCH: 32.9 pg (ref 26.0–34.0)
MCHC: 37.5 g/dL — ABNORMAL HIGH (ref 30.0–36.0)
MCV: 87.8 fL (ref 78.0–100.0)
MONO ABS: 0.6 10*3/uL (ref 0.1–1.0)
MONOS PCT: 6 %
NEUTROS ABS: 7.4 10*3/uL (ref 1.7–7.7)
Neutrophils Relative %: 73 %
Platelets: 144 10*3/uL — ABNORMAL LOW (ref 150–400)
RBC: 4.41 MIL/uL (ref 4.22–5.81)
RDW: 12.7 % (ref 11.5–15.5)
WBC: 10.1 10*3/uL (ref 4.0–10.5)

## 2017-02-15 LAB — COMPREHENSIVE METABOLIC PANEL
ALK PHOS: 266 U/L — AB (ref 38–126)
ALT: 64 U/L — ABNORMAL HIGH (ref 17–63)
AST: 83 U/L — AB (ref 15–41)
Albumin: 4.4 g/dL (ref 3.5–5.0)
Anion gap: 17 — ABNORMAL HIGH (ref 5–15)
BUN: 10 mg/dL (ref 6–20)
CALCIUM: 9.8 mg/dL (ref 8.9–10.3)
CO2: 17 mmol/L — ABNORMAL LOW (ref 22–32)
CREATININE: 0.82 mg/dL (ref 0.61–1.24)
Chloride: 97 mmol/L — ABNORMAL LOW (ref 101–111)
Glucose, Bld: 219 mg/dL — ABNORMAL HIGH (ref 65–99)
Potassium: 4 mmol/L (ref 3.5–5.1)
Sodium: 131 mmol/L — ABNORMAL LOW (ref 135–145)
Total Bilirubin: 1.8 mg/dL — ABNORMAL HIGH (ref 0.3–1.2)
Total Protein: 8.4 g/dL — ABNORMAL HIGH (ref 6.5–8.1)

## 2017-02-15 LAB — LIPASE, BLOOD: LIPASE: 16 U/L (ref 11–51)

## 2017-02-15 LAB — TROPONIN I

## 2017-02-15 MED ORDER — ONDANSETRON HCL 4 MG/2ML IJ SOLN
4.0000 mg | Freq: Once | INTRAMUSCULAR | Status: AC
  Administered 2017-02-15: 4 mg via INTRAVENOUS
  Filled 2017-02-15: qty 2

## 2017-02-15 MED ORDER — SODIUM CHLORIDE 0.9 % IV BOLUS (SEPSIS)
1000.0000 mL | Freq: Once | INTRAVENOUS | Status: AC
  Administered 2017-02-15: 1000 mL via INTRAVENOUS

## 2017-02-15 MED ORDER — DEXTROSE 5 % IV SOLN
1.0000 g | Freq: Once | INTRAVENOUS | Status: AC
  Administered 2017-02-15: 1 g via INTRAVENOUS
  Filled 2017-02-15: qty 10

## 2017-02-15 MED ORDER — AZITHROMYCIN 250 MG PO TABS
1000.0000 mg | ORAL_TABLET | Freq: Once | ORAL | Status: AC
Start: 2017-02-15 — End: 2017-02-15
  Administered 2017-02-15: 1000 mg via ORAL
  Filled 2017-02-15: qty 4

## 2017-02-15 MED ORDER — HYDROMORPHONE HCL 1 MG/ML IJ SOLN
1.0000 mg | Freq: Once | INTRAMUSCULAR | Status: AC
  Administered 2017-02-15: 1 mg via INTRAVENOUS
  Filled 2017-02-15: qty 1

## 2017-02-15 MED ORDER — SODIUM CHLORIDE 0.9 % IV SOLN
Freq: Once | INTRAVENOUS | Status: AC
  Administered 2017-02-15: 20:00:00 via INTRAVENOUS

## 2017-02-15 MED ORDER — CEPHALEXIN 500 MG PO CAPS: 1000.0000 mg | ORAL_CAPSULE | Freq: Two times a day (BID) | ORAL | 0 refills | Status: DC

## 2017-02-15 MED ORDER — LEVOFLOXACIN IN D5W 500 MG/100ML IV SOLN
500.0000 mg | Freq: Once | INTRAVENOUS | Status: DC
  Filled 2017-02-15: qty 100

## 2017-02-15 MED ORDER — OXYCODONE HCL 5 MG PO TABS: 5.0000 mg | ORAL_TABLET | ORAL | 0 refills | Status: DC | PRN

## 2017-02-15 MED ORDER — DOXYCYCLINE HYCLATE 100 MG PO CAPS: 100.0000 mg | ORAL_CAPSULE | Freq: Two times a day (BID) | ORAL | 0 refills | Status: DC

## 2017-02-15 MED ORDER — PANTOPRAZOLE SODIUM 40 MG IV SOLR
40.0000 mg | Freq: Once | INTRAVENOUS | Status: AC
  Administered 2017-02-15: 40 mg via INTRAVENOUS
  Filled 2017-02-15: qty 40

## 2017-02-15 NOTE — ED Provider Notes (Signed)
[facility]-EMERGENCY DEPT Provider Note   CSN: [id number] Arrival date & time: 02/15/17  1700     History   Chief Complaint Chief Complaint  Patient presents with  . Back Pain  . Emesis    HPI Patrick Small is a 56 y.o. male.  HPI Patient reports have pretty sudden onset of right sided flank and back pain. It started about 9 AM. He reports the pain is severe and sharp. He has associated nausea and vomiting. He reports after vomiting now he has burning pain in his central chest. His niece thinks is due to reflux from vomiting. No associated cough or shortness of breath. Patient reports that he has noted a little bit of whitish "salty" penile discharge. He denies that it was greenish yellow. He reports he has not had any STD since he was a teenager. Patient does report prior history of kidney stone. Patient is sexually active. He does consume alcohol. Past Medical History:  Diagnosis Date  . Acid reflux   . Acne rosacea   . Diabetes mellitus without complication (HCC) Dx 2010  . DVT (deep venous thrombosis) (HCC) 2012 and 2013    Left and Right leg, no history of PE    . Hyperlipidemia Dx 2010  . Hypertension Dx 2010  . Mood disorder (HCC)   . Plantar fasciitis   . Substance abuse    last alchohol intake 01/24/2013  . Thyroid disease     Patient Active Problem List   Diagnosis Date Noted  . Odynophagia 02/01/2017  . Esophageal dysphagia 02/01/2017  . Nausea and vomiting 10/17/2016  . Pain in joint of left wrist 04/29/2016  . Diabetes type 2, controlled (HCC) 03/04/2016  . Chronic low back pain 03/04/2016  . Diabetic peripheral neuropathy associated with type 2 diabetes mellitus (HCC) 03/04/2016  . Transaminasemia   . Pacemaker infection (HCC)   . Sinus node dysfunction (HCC)   . Hyponatremia 02/18/2016  . Syncope 02/18/2016  . Elevated lactic acid level 02/18/2016  . Depression 11/16/2014  . Atrial fibrillation (HCC) 10/05/2014  . Anticoagulated on Coumadin 10/05/2014    . High cholesterol 09/26/2014  . Hypertension 09/25/2014  . Personal history of DVT (deep vein thrombosis) 09/25/2014  . Pacemaker 09/25/2014  . Irregular heartbeat 09/25/2014  . Hypothyroidism 09/25/2014    Past Surgical History:  Procedure Laterality Date  . PACEMAKER INSERTION  2014  . TEE WITHOUT CARDIOVERSION N/A 02/22/2016   Procedure: TRANSESOPHAGEAL ECHOCARDIOGRAM (TEE);  Surgeon: Chilton Si, MD;  Location: [facility] ENDOSCOPY;  Service: Cardiovascular;  Laterality: N/A;       Home Medications    Prior to Admission medications   Medication Sig Start Date End Date Taking? Authorizing Provider  amLODipine (NORVASC) 10 MG tablet Take 1 tablet (10 mg total) by mouth daily. 11/04/16   Josalyn Funches, MD  DULoxetine (CYMBALTA) 30 MG capsule Take 1 capsule (30 mg total) by mouth daily. 11/04/16   Josalyn Funches, MD  exenatide (BYETTA) 10 MCG/0.04ML SOPN injection Inject 0.04 mLs (10 mcg total) into the skin 2 (two) times daily with a meal. 12/11/16   Josalyn Funches, MD  folic acid (FOLVITE) 1 MG tablet Take 1 tablet (1 mg total) by mouth daily. 01/06/17   Josalyn Funches, MD  glucose blood (TRUE METRIX BLOOD GLUCOSE TEST) test strip Use as instructed 11/14/16   Josalyn Funches, MD  insulin aspart (NOVOLOG) 100 UNIT/ML injection Inject 30 Units into the skin 3 (three) times daily with meals. 12/11/16   Dessa Phi, MD  Insulin Glargine (LANTUS SOLOSTAR) 100 UNIT/ML Solostar Pen Inject 30 Units into the skin daily at 10 pm. 12/11/16   Dessa PhiJosalyn Funches, MD  levothyroxine (SYNTHROID, LEVOTHROID) 50 MCG tablet Take 1 tablet (50 mcg total) by mouth daily before breakfast. 12/11/16   Dessa PhiJosalyn Funches, MD  losartan (COZAAR) 50 MG tablet Take 1 tablet (50 mg total) by mouth daily. 01/30/17   Dessa PhiJosalyn Funches, MD  Multiple Vitamin (MULTIVITAMIN WITH MINERALS) TABS tablet Take 1 tablet by mouth daily. 10/19/16   Leana Roeawood S Elgergawy, MD  ondansetron (ZOFRAN) 4 MG tablet Take 1 tablet (4 mg total)  by mouth every 8 (eight) hours as needed for nausea or vomiting. 11/04/16   Josalyn Funches, MD  pantoprazole (PROTONIX) 40 MG tablet Take 1 tablet (40 mg total) by mouth daily. 01/30/17   Dessa PhiJosalyn Funches, MD  potassium chloride (K-DUR) 10 MEQ tablet Please take 40 meq oral daily for 3 days, then 20 meq oral daily for 2 weeks. 10/18/16   Leana Roeawood S Elgergawy, MD  rivaroxaban (XARELTO) 20 MG TABS tablet Take 1 tablet (20 mg total) by mouth daily with supper. 12/15/16   Dessa PhiJosalyn Funches, MD  thiamine 100 MG tablet Take 1 tablet (100 mg total) by mouth daily. 10/19/16   Leana Roeawood S Elgergawy, MD  zolpidem (AMBIEN) 10 MG tablet Take 1 tablet (10 mg total) by mouth at bedtime as needed for sleep. 11/04/16 12/04/16  Dessa PhiJosalyn Funches, MD    Family History Family History  Problem Relation Age of Onset  . Diabetes Mother   . Diabetes Father   . Diabetes Sister     Social History Social History  Substance Use Topics  . Smoking status: Current Every Day Smoker    Packs/day: 6.00    Types: Cigarettes  . Smokeless tobacco: Never Used  . Alcohol use 0.0 oz/week     Comment: only on weekends     Allergies   Penicillins; Bacitracin; and Septra [sulfamethoxazole-trimethoprim]   Review of Systems Review of Systems  10 Systems reviewed and are negative for acute change except as noted in the HPI.  Physical Exam Updated Vital Signs BP 123/77   Pulse 65   Temp 98 F (36.7 C) (Oral)   Resp 17   Wt 188 lb (85.3 kg)   SpO2 100%   BMI 24.80 kg/m   Physical Exam  Constitutional: He is oriented to person, place, and time. He appears well-developed and well-nourished.  Patient was initially asleep upon entering the room. After awakened he however reported he was having severe pain. No respiratory distress.  HENT:  Head: Normocephalic and atraumatic.  Eyes: Conjunctivae are normal.  Neck: Neck supple.  Cardiovascular: Normal rate and regular rhythm.   No murmur heard. Pulmonary/Chest: Effort normal  and breath sounds normal. No respiratory distress.  Abdominal: Soft. He exhibits no distension. There is tenderness.  Moderate suprapubic tenderness and far right flank tenderness to palpation. No guarding.  Genitourinary: Penis normal.  Genitourinary Comments: No evidence of penile discharge. No lesions of the penis. Testicles are nontender. No scrotal swelling.  Musculoskeletal: He exhibits no edema.  Neurological: He is alert and oriented to person, place, and time. He exhibits normal muscle tone. Coordination normal.  Skin: Skin is warm and dry.  Psychiatric: He has a normal mood and affect.  Nursing note and vitals reviewed.    ED Treatments / Results  Labs (all labs ordered are listed, but only abnormal results are displayed) Labs Reviewed  COMPREHENSIVE METABOLIC PANEL - Abnormal; Notable for  the following:       Result Value   Sodium 131 (*)    Chloride 97 (*)    CO2 17 (*)    Glucose, Bld 219 (*)    Total Protein 8.4 (*)    AST 83 (*)    ALT 64 (*)    Alkaline Phosphatase 266 (*)    Total Bilirubin 1.8 (*)    Anion gap 17 (*)    All other components within normal limits  CBC WITH DIFFERENTIAL/PLATELET - Abnormal; Notable for the following:    HCT 38.7 (*)    MCHC 37.5 (*)    Platelets 144 (*)    All other components within normal limits  URINALYSIS, ROUTINE W REFLEX MICROSCOPIC - Abnormal; Notable for the following:    Glucose, UA >=500 (*)    Hgb urine dipstick SMALL (*)    Ketones, ur 80 (*)    Protein, ur 100 (*)    Bacteria, UA RARE (*)    Squamous Epithelial / LPF 0-5 (*)    All other components within normal limits  URINE CULTURE  LIPASE, BLOOD  TROPONIN I  RPR  HIV ANTIBODY (ROUTINE TESTING)  GC/CHLAMYDIA PROBE AMP ([place]) NOT AT [facility]    EKG  EKG Interpretation None       Radiology Ct Renal Stone Study  Result Date: 02/15/2017 CLINICAL DATA:  Acute onset of right lower back pain and vomiting. Initial encounter. EXAM: CT ABDOMEN AND  PELVIS WITHOUT CONTRAST TECHNIQUE: Multidetector CT imaging of the abdomen and pelvis was performed following the standard protocol without IV contrast. COMPARISON:  MRCP performed 02/08/2010, and CT of the abdomen and pelvis from 02/03/2010 FINDINGS: Lower chest: The visualized lung bases are grossly clear. The visualized portions of the mediastinum are unremarkable. A pacemaker lead is noted ending at the right ventricle. Hepatobiliary: The liver is unremarkable in appearance. The gallbladder is unremarkable in appearance. The common bile duct remains normal in caliber. Pancreas: The pancreas is within normal limits. Spleen: The spleen is unremarkable in appearance. Adrenals/Urinary Tract: The adrenal glands are unremarkable in appearance. The kidneys are within normal limits. There is no evidence of hydronephrosis. No renal or ureteral stones are identified. No perinephric stranding is seen. Stomach/Bowel: The stomach is unremarkable in appearance. The small bowel is within normal limits. The appendix is normal in caliber, without evidence of appendicitis. The colon is unremarkable in appearance. Vascular/Lymphatic: Scattered calcification is seen along the abdominal aorta and its branches. The abdominal aorta is otherwise grossly unremarkable. The inferior vena cava is grossly unremarkable. No retroperitoneal lymphadenopathy is seen. No pelvic sidewall lymphadenopathy is identified. Reproductive: Mild bladder wall thickening may reflect mild cystitis. The prostate remains normal in size, with scattered calcification. Other: No additional soft tissue abnormalities are seen. Musculoskeletal: No acute osseous abnormalities are identified. The visualized musculature is unremarkable in appearance. IMPRESSION: 1. Mild bladder wall thickening may reflect mild cystitis. 2. Scattered aortic atherosclerosis. Electronically Signed   By: Roanna Raider M.D.   On: 02/15/2017 20:02    Procedures Procedures (including  critical care time)  Medications Ordered in ED Medications  HYDROmorphone (DILAUDID) injection 1 mg (1 mg Intravenous Given 02/15/17 1933)  ondansetron (ZOFRAN) injection 4 mg (4 mg Intravenous Given 02/15/17 1931)  pantoprazole (PROTONIX) injection 40 mg (40 mg Intravenous Given 02/15/17 1934)  0.9 %  sodium chloride infusion ( Intravenous New Bag/Given 02/15/17 1931)  HYDROmorphone (DILAUDID) injection 1 mg (1 mg Intravenous Given 02/15/17 2227)  cefTRIAXone (ROCEPHIN) 1  g in dextrose 5 % 50 mL IVPB (1 g Intravenous New Bag/Given 02/15/17 2227)  azithromycin (ZITHROMAX) tablet 1,000 mg (1,000 mg Oral Given 02/15/17 2226)  sodium chloride 0.9 % bolus 1,000 mL (1,000 mLs Intravenous New Bag/Given 02/15/17 2226)     Initial Impression / Assessment and Plan / ED Course  I have reviewed the triage vital signs and the nursing notes.  Pertinent labs & imaging results that were available during my care of the patient were reviewed by me and considered in my medical decision making (see chart for details).      Final Clinical Impressions(s) / ED Diagnoses   Final diagnoses:  Lower urinary tract infectious disease  Non-intractable vomiting with nausea, unspecified vomiting type  Flank pain   Patient's pain is right sided, predominantly in the flank and suprapubic region. Urine does have positive white cells in it. Patient does report he has had some penile discharge, although he does not believe it to be purulent. He is sexually active. Patient we treated for UTI and possible STD exposure. Patient has mild elevation in LFTs. He has no positive findings on CT scan. Vital signs have normalized and patient is clinically well appearance. Patient does have significant alcohol use history and enzymes more elevated in the past. Hepatitis workup was negative. CT does not identify any hepatic or biliary anomalies. Patient will be treated with antibiotics for possible STD and return precautions are  reviewed. New Prescriptions New Prescriptions   No medications on file     Arby Barrette, MD 02/15/17 2317

## 2017-02-15 NOTE — Discharge Instructions (Signed)
Follow up with your doctor this week for recheck . Avoid all alcohol.

## 2017-02-15 NOTE — ED Triage Notes (Signed)
Pt c/o lower right back pain onset 9 am today. Pt also had one episode of emesis. Pt reports history of kidney stones and renal problems but does not feel it is the same.

## 2017-02-16 ENCOUNTER — Telehealth: Payer: Self-pay

## 2017-02-16 ENCOUNTER — Other Ambulatory Visit: Payer: Self-pay | Admitting: Family Medicine

## 2017-02-16 DIAGNOSIS — G4700 Insomnia, unspecified: Secondary | ICD-10-CM

## 2017-02-16 LAB — GC/CHLAMYDIA PROBE AMP (~~LOC~~) NOT AT ARMC
Chlamydia: NEGATIVE
NEISSERIA GONORRHEA: NEGATIVE

## 2017-02-16 LAB — RPR: RPR: NONREACTIVE

## 2017-02-16 LAB — HIV ANTIBODY (ROUTINE TESTING W REFLEX): HIV Screen 4th Generation wRfx: NONREACTIVE

## 2017-02-16 MED ORDER — ZOLPIDEM TARTRATE 10 MG PO TABS: 10.0000 mg | ORAL_TABLET | Freq: Every evening | ORAL | 2 refills | Status: DC | PRN

## 2017-02-16 NOTE — Telephone Encounter (Signed)
Pt was called and informed of lab results. 

## 2017-02-17 ENCOUNTER — Telehealth: Payer: Self-pay | Admitting: Family Medicine

## 2017-02-17 LAB — URINE CULTURE

## 2017-02-17 NOTE — Telephone Encounter (Signed)
Pt called requesting to speak to nurse regarding advise on what medication he should still be taking after hospital visit. Please f/up

## 2017-02-17 NOTE — Telephone Encounter (Signed)
Pt was called and there was no answer a VM was left informing pt to return phone call.

## 2017-02-19 ENCOUNTER — Inpatient Hospital Stay: Payer: Self-pay

## 2017-02-20 ENCOUNTER — Encounter: Payer: Self-pay | Admitting: Family Medicine

## 2017-02-20 ENCOUNTER — Ambulatory Visit: Payer: Medicaid Other | Attending: Family Medicine | Admitting: Family Medicine

## 2017-02-20 VITALS — BP 123/79 | HR 70 | Temp 97.9°F | Ht 73.0 in | Wt 179.0 lb

## 2017-02-20 DIAGNOSIS — I10 Essential (primary) hypertension: Secondary | ICD-10-CM | POA: Insufficient documentation

## 2017-02-20 DIAGNOSIS — E876 Hypokalemia: Secondary | ICD-10-CM

## 2017-02-20 DIAGNOSIS — Z794 Long term (current) use of insulin: Secondary | ICD-10-CM | POA: Diagnosis not present

## 2017-02-20 DIAGNOSIS — G8929 Other chronic pain: Secondary | ICD-10-CM | POA: Diagnosis not present

## 2017-02-20 DIAGNOSIS — IMO0002 Reserved for concepts with insufficient information to code with codable children: Secondary | ICD-10-CM

## 2017-02-20 DIAGNOSIS — E1142 Type 2 diabetes mellitus with diabetic polyneuropathy: Secondary | ICD-10-CM

## 2017-02-20 DIAGNOSIS — Z86718 Personal history of other venous thrombosis and embolism: Secondary | ICD-10-CM | POA: Diagnosis not present

## 2017-02-20 DIAGNOSIS — D696 Thrombocytopenia, unspecified: Secondary | ICD-10-CM | POA: Diagnosis not present

## 2017-02-20 DIAGNOSIS — N3000 Acute cystitis without hematuria: Secondary | ICD-10-CM

## 2017-02-20 DIAGNOSIS — F17210 Nicotine dependence, cigarettes, uncomplicated: Secondary | ICD-10-CM | POA: Diagnosis not present

## 2017-02-20 DIAGNOSIS — E1165 Type 2 diabetes mellitus with hyperglycemia: Secondary | ICD-10-CM | POA: Diagnosis not present

## 2017-02-20 DIAGNOSIS — M545 Low back pain: Secondary | ICD-10-CM | POA: Insufficient documentation

## 2017-02-20 DIAGNOSIS — Z7901 Long term (current) use of anticoagulants: Secondary | ICD-10-CM | POA: Insufficient documentation

## 2017-02-20 LAB — COMPLETE METABOLIC PANEL WITH GFR
ALK PHOS: 222 U/L — AB (ref 40–115)
ALT: 31 U/L (ref 9–46)
AST: 33 U/L (ref 10–35)
Albumin: 4.1 g/dL (ref 3.6–5.1)
BUN: 5 mg/dL — ABNORMAL LOW (ref 7–25)
CALCIUM: 9.7 mg/dL (ref 8.6–10.3)
CO2: 29 mmol/L (ref 20–31)
CREATININE: 0.94 mg/dL (ref 0.70–1.33)
Chloride: 92 mmol/L — ABNORMAL LOW (ref 98–110)
GFR, Est Non African American: >89 mL/min (ref >=60)
Glucose, Bld: 260 mg/dL — ABNORMAL HIGH (ref 65–99)
Potassium: 3.2 mmol/L — ABNORMAL LOW (ref 3.5–5.3)
Sodium: 129 mmol/L — ABNORMAL LOW (ref 135–146)
Total Bilirubin: 0.8 mg/dL (ref 0.2–1.2)
Total Protein: 7.6 g/dL (ref 6.1–8.1)

## 2017-02-20 LAB — HEMOCCULT GUIAC POC 1CARD (OFFICE): FECAL OCCULT BLD: NEGATIVE

## 2017-02-20 LAB — GLUCOSE, POCT (MANUAL RESULT ENTRY): POC Glucose: 339 mg/dl — AB (ref 70–99)

## 2017-02-20 LAB — POCT URINALYSIS DIPSTICK
Bilirubin, UA: NEGATIVE
Glucose, UA: 500
Ketones, UA: NEGATIVE
Leukocytes, UA: NEGATIVE
Nitrite, UA: NEGATIVE
PROTEIN UA: NEGATIVE
SPEC GRAV UA: 1.01
Urobilinogen, UA: 1
pH, UA: 6

## 2017-02-20 LAB — POCT GLYCOSYLATED HEMOGLOBIN (HGB A1C): Hemoglobin A1C: 13

## 2017-02-20 MED ORDER — CYCLOBENZAPRINE HCL 10 MG PO TABS: 10.0000 mg | ORAL_TABLET | Freq: Three times a day (TID) | ORAL | 0 refills | Status: DC | PRN

## 2017-02-20 NOTE — Progress Notes (Addendum)
Subjective:  Patient ID: Patrick Small, male    DOB: 06-Jun-1961  Age: 56 y.o. MRN: [id number]  CC: Follow-up   HPI Patrick Small has diabetes, HTN, chronic low back pain, hx of DVT on xarelto, hx of alcohol abuse he   presents for    1. ED f/u UTI: he was seen in the ED on 02/15/2017 for back pain and emesis. He was determined to have UTI. He was treated with keflex. Urine culture grew out multiple species. CBC normal except for low platelets 144. CMP with slight low sodium 131, elevated glucose 219, slightly elevated liver enzymes and elevated alk phos. He has not drank anything since the super bowl.   Today, he reports he is still having pain on the R side of his back. Pain is severe. No fever or chills. No nausea or emesis. No trauma to back. No abdominal pain. No pain with urination. No blood in urine. He is taking doxycyline 100 mg twice daily for 10 day course and keflex 1000 mg BID, 7 day course.   CT renal stone study 02/15/2017: IMPRESSION: 1. Mild bladder wall thickening may reflect mild cystitis. 2. Scattered aortic atherosclerosis   2. DM2: intermittently compliant with regimen. High sugars recently. No medications now.    Social History  Substance Use Topics  . Smoking status: Current Every Day Smoker    Packs/day: 6.00    Types: Cigarettes  . Smokeless tobacco: Never Used  . Alcohol use 0.0 oz/week     Comment: only on weekends    Outpatient Medications Prior to Visit  Medication Sig Dispense Refill  . amLODipine (NORVASC) 10 MG tablet Take 1 tablet (10 mg total) by mouth daily. 30 tablet 11  . cephALEXin (KEFLEX) 500 MG capsule Take 2 capsules (1,000 mg total) by mouth 2 (two) times daily. 28 capsule 0  . doxycycline (VIBRAMYCIN) 100 MG capsule Take 1 capsule (100 mg total) by mouth 2 (two) times daily. 20 capsule 0  . DULoxetine (CYMBALTA) 30 MG capsule Take 1 capsule (30 mg total) by mouth daily. 30 capsule 5  . exenatide (BYETTA) 10 MCG/0.04ML SOPN  injection Inject 0.04 mLs (10 mcg total) into the skin 2 (two) times daily with a meal. 2.4 mL 5  . folic acid (FOLVITE) 1 MG tablet Take 1 tablet (1 mg total) by mouth daily. 30 tablet 0  . glucose blood (TRUE METRIX BLOOD GLUCOSE TEST) test strip Use as instructed 100 each 12  . insulin aspart (NOVOLOG) 100 UNIT/ML injection Inject 30 Units into the skin 3 (three) times daily with meals. 10 mL 5  . Insulin Glargine (LANTUS SOLOSTAR) 100 UNIT/ML Solostar Pen Inject 30 Units into the skin daily at 10 pm. 15 mL 5  . levothyroxine (SYNTHROID, LEVOTHROID) 50 MCG tablet Take 1 tablet (50 mcg total) by mouth daily before breakfast. 30 tablet 5  . Multiple Vitamin (MULTIVITAMIN WITH MINERALS) TABS tablet Take 1 tablet by mouth daily. 30 tablet 0  . ondansetron (ZOFRAN) 4 MG tablet Take 1 tablet (4 mg total) by mouth every 8 (eight) hours as needed for nausea or vomiting. 30 tablet 1  . oxyCODONE (ROXICODONE) 5 MG immediate release tablet Take 1 tablet (5 mg total) by mouth every 4 (four) hours as needed for severe pain. 15 tablet 0  . rivaroxaban (XARELTO) 20 MG TABS tablet Take 1 tablet (20 mg total) by mouth daily with supper. 30 tablet 5  . thiamine 100 MG tablet Take 1 tablet (100 mg total)  by mouth daily. 30 tablet 0  . zolpidem (AMBIEN) 10 MG tablet Take 1 tablet (10 mg total) by mouth at bedtime as needed for sleep. 30 tablet 2  . losartan (COZAAR) 50 MG tablet Take 1 tablet (50 mg total) by mouth daily. (Patient not taking: Reported on 02/20/2017) 90 tablet 3  . pantoprazole (PROTONIX) 40 MG tablet Take 1 tablet (40 mg total) by mouth daily. (Patient not taking: Reported on 02/20/2017) 30 tablet 3  . potassium chloride (K-DUR) 10 MEQ tablet Please take 40 meq oral daily for 3 days, then 20 meq oral daily for 2 weeks. (Patient not taking: Reported on 02/20/2017) 40 tablet 0   No facility-administered medications prior to visit.     ROS Review of Systems  Constitutional: Negative for chills,  fatigue, fever and unexpected weight change.  HENT: Positive for sore throat and trouble swallowing.   Eyes: Negative for visual disturbance.  Respiratory: Negative for cough and shortness of breath.   Cardiovascular: Negative for chest pain, palpitations and leg swelling.  Gastrointestinal: Negative for abdominal pain, blood in stool, constipation, diarrhea, nausea and vomiting.  Endocrine: Negative for polydipsia, polyphagia and polyuria.  Musculoskeletal: Positive for joint swelling. Negative for arthralgias, back pain, gait problem, myalgias and neck pain.  Skin: Negative for rash.  Allergic/Immunologic: Negative for immunocompromised state.  Hematological: Negative for adenopathy. Does not bruise/bleed easily.  Psychiatric/Behavioral: Negative for dysphoric mood, sleep disturbance and suicidal ideas. The patient is not nervous/anxious.     Objective:  BP 123/79 (BP Location: Left Arm, Patient Position: Sitting, Cuff Size: Small)   Pulse 70   Temp 97.9 F (36.6 C) (Oral)   Ht 6' 1"  (1.854 m)   Wt 179 lb (81.2 kg)   SpO2 100%   BMI 23.62 kg/m   BP/Weight 02/20/2017 03/07/4075 8/0/8811  Systolic BP 031 594 585  Diastolic BP 79 91 929  Wt. (Lbs) 179 188 188.6  BMI 23.62 24.8 24.88    Physical Exam  Constitutional: He appears well-developed and well-nourished. No distress.  HENT:  Head: Normocephalic and atraumatic.  Neck: Normal range of motion. Neck supple.  Cardiovascular: Normal rate, regular rhythm, normal heart sounds and intact distal pulses.   Pulmonary/Chest: Effort normal and breath sounds normal.  Musculoskeletal: He exhibits no edema.       Back:  Neurological: He is alert.  Skin: Skin is warm and dry. No rash noted. No erythema.  Psychiatric: He has a normal mood and affect.   UA: 500 glucose, otherwise negative  Lab Results  Component Value Date   HGBA1C 6.2 (H) 10/18/2016   Lab Results  Component Value Date   HGBA1C 13.0 02/20/2017    CBG 339    Patient took his home dose of novolog 30 U   Assessment & Plan:   Darryn was seen today for follow-up.  Diagnoses and all orders for this visit:  Acute cystitis without hematuria -     POCT urinalysis dipstick  Controlled type 2 diabetes mellitus with diabetic polyneuropathy, with long-term current use of insulin (HCC) -     Glucose (CBG) -     POCT glycosylated hemoglobin (Hb A1C)  Chronic right-sided low back pain without sciatica -     cyclobenzaprine (FLEXERIL) 10 MG tablet; Take 1 tablet (10 mg total) by mouth 3 (three) times daily as needed for muscle spasms. -     COMPLETE METABOLIC PANEL WITH GFR -     Hemoccult - 1 Card (office)  No orders of the defined types were placed in this encounter.   Follow-up: No Follow-up on file.   Boykin Nearing MD

## 2017-02-20 NOTE — Patient Instructions (Addendum)
Patrick Small was seen today for follow-up.  Diagnoses and all orders for this visit:  Acute cystitis without hematuria -     POCT urinalysis dipstick  Controlled type 2 diabetes mellitus with diabetic polyneuropathy, with long-term current use of insulin (HCC) -     Glucose (CBG) -     POCT glycosylated hemoglobin (Hb A1C)  Chronic right-sided low back pain without sciatica -     cyclobenzaprine (FLEXERIL) 10 MG tablet; Take 1 tablet (10 mg total) by mouth 3 (three) times daily as needed for muscle spasms. -     COMPLETE METABOLIC PANEL WITH GFR   Your urine is negative for evidence of ongoing infection Finish keflex and doxcycline  Take flexeril muscle relaxer for R sided low back pain  Diabetes blood sugar goals  Fasting (in AM before breakfast, 8 hrs of no eating or drinking (except water or unsweetened coffee or tea): 90-130 2 hrs after meals: < 160,   No low sugars: nothing < 70    F/u in 3 weeks for CBG check with clinical pharmacologist  F/u with me in 6 weeks for low back pain  Dr. Armen PickupFunches

## 2017-02-20 NOTE — Assessment & Plan Note (Signed)
Diabetes control has declined Patient with high sugars A1c 13 Plan: Continue regimen Take as prescribed Monitor sugars Close f/u for CBG check

## 2017-02-20 NOTE — Assessment & Plan Note (Signed)
MSK pain UA normal today U Cx was negative  Finish antibiotics  Flexeril for pain

## 2017-02-23 MED ORDER — POTASSIUM CHLORIDE CRYS ER 20 MEQ PO TBCR: 40.0000 meq | EXTENDED_RELEASE_TABLET | Freq: Every day | ORAL | 0 refills | Status: DC

## 2017-02-23 NOTE — Addendum Note (Signed)
Addended by: Dessa PhiFUNCHES, Wenonah Milo on: 02/23/2017 05:29 PM   Modules accepted: Orders

## 2017-02-25 ENCOUNTER — Telehealth: Payer: Self-pay

## 2017-02-25 NOTE — Telephone Encounter (Signed)
Pt was called and informed of lab results and to stop medication.

## 2017-02-25 NOTE — Telephone Encounter (Signed)
Pt was called and a VM was left informing pt to return phone call for lab results. 

## 2017-02-26 ENCOUNTER — Other Ambulatory Visit: Payer: Self-pay | Admitting: Family Medicine

## 2017-02-26 MED ORDER — FOLIC ACID 1 MG PO TABS: 1.0000 mg | ORAL_TABLET | Freq: Every day | ORAL | 11 refills | Status: DC

## 2017-03-05 ENCOUNTER — Other Ambulatory Visit: Payer: Self-pay | Admitting: Pharmacist

## 2017-03-05 DIAGNOSIS — G8929 Other chronic pain: Secondary | ICD-10-CM

## 2017-03-05 DIAGNOSIS — M545 Low back pain: Principal | ICD-10-CM

## 2017-03-05 MED ORDER — CYCLOBENZAPRINE HCL 10 MG PO TABS: 10.0000 mg | ORAL_TABLET | Freq: Three times a day (TID) | ORAL | 0 refills | Status: DC | PRN

## 2017-03-12 ENCOUNTER — Ambulatory Visit: Payer: Self-pay | Admitting: Pharmacist

## 2017-03-24 ENCOUNTER — Ambulatory Visit: Payer: Medicaid Other | Attending: Family Medicine | Admitting: Family Medicine

## 2017-03-24 ENCOUNTER — Encounter: Payer: Self-pay | Admitting: Family Medicine

## 2017-03-24 VITALS — BP 132/88 | HR 78 | Temp 98.0°F | Ht 73.0 in | Wt 182.0 lb

## 2017-03-24 DIAGNOSIS — L719 Rosacea, unspecified: Secondary | ICD-10-CM | POA: Insufficient documentation

## 2017-03-24 DIAGNOSIS — G8929 Other chronic pain: Secondary | ICD-10-CM | POA: Insufficient documentation

## 2017-03-24 DIAGNOSIS — Z86718 Personal history of other venous thrombosis and embolism: Secondary | ICD-10-CM | POA: Diagnosis not present

## 2017-03-24 DIAGNOSIS — E119 Type 2 diabetes mellitus without complications: Secondary | ICD-10-CM | POA: Diagnosis present

## 2017-03-24 DIAGNOSIS — Z794 Long term (current) use of insulin: Secondary | ICD-10-CM | POA: Diagnosis not present

## 2017-03-24 DIAGNOSIS — Z9119 Patient's noncompliance with other medical treatment and regimen: Secondary | ICD-10-CM | POA: Insufficient documentation

## 2017-03-24 DIAGNOSIS — G4700 Insomnia, unspecified: Secondary | ICD-10-CM | POA: Diagnosis not present

## 2017-03-24 DIAGNOSIS — E1165 Type 2 diabetes mellitus with hyperglycemia: Secondary | ICD-10-CM | POA: Diagnosis not present

## 2017-03-24 DIAGNOSIS — Z7901 Long term (current) use of anticoagulants: Secondary | ICD-10-CM | POA: Insufficient documentation

## 2017-03-24 DIAGNOSIS — M545 Low back pain: Secondary | ICD-10-CM | POA: Diagnosis present

## 2017-03-24 DIAGNOSIS — Z950 Presence of cardiac pacemaker: Secondary | ICD-10-CM | POA: Insufficient documentation

## 2017-03-24 DIAGNOSIS — E1140 Type 2 diabetes mellitus with diabetic neuropathy, unspecified: Secondary | ICD-10-CM | POA: Insufficient documentation

## 2017-03-24 DIAGNOSIS — E118 Type 2 diabetes mellitus with unspecified complications: Secondary | ICD-10-CM

## 2017-03-24 DIAGNOSIS — I4891 Unspecified atrial fibrillation: Secondary | ICD-10-CM | POA: Insufficient documentation

## 2017-03-24 DIAGNOSIS — I10 Essential (primary) hypertension: Secondary | ICD-10-CM | POA: Insufficient documentation

## 2017-03-24 DIAGNOSIS — E1142 Type 2 diabetes mellitus with diabetic polyneuropathy: Secondary | ICD-10-CM

## 2017-03-24 DIAGNOSIS — E785 Hyperlipidemia, unspecified: Secondary | ICD-10-CM | POA: Diagnosis not present

## 2017-03-24 DIAGNOSIS — K219 Gastro-esophageal reflux disease without esophagitis: Secondary | ICD-10-CM | POA: Insufficient documentation

## 2017-03-24 DIAGNOSIS — IMO0002 Reserved for concepts with insufficient information to code with codable children: Secondary | ICD-10-CM

## 2017-03-24 LAB — GLUCOSE, POCT (MANUAL RESULT ENTRY)
POC GLUCOSE: 436 mg/dL — AB (ref 70–99)
POC Glucose: 426 mg/dl — AB (ref 70–99)

## 2017-03-24 MED ORDER — GLUCOSE BLOOD VI STRP: ORAL_STRIP | 12 refills | Status: DC

## 2017-03-24 MED ORDER — GABAPENTIN 300 MG PO CAPS: 300.0000 mg | ORAL_CAPSULE | Freq: Two times a day (BID) | ORAL | 3 refills | Status: DC

## 2017-03-24 MED ORDER — INSULIN ASPART 100 UNIT/ML ~~LOC~~ SOLN
15.0000 [IU] | Freq: Once | SUBCUTANEOUS | Status: AC
  Administered 2017-03-24: 15 [IU] via SUBCUTANEOUS

## 2017-03-24 NOTE — Progress Notes (Signed)
Subjective:  Patient ID: Patrick Small, male    DOB: [birth year]  Age: 56 y.o. MRN: [id number]  CC: Diabetes; Back Pain (lower); and Foot Pain (bilateral)   HPI Patrick Small is a 56 year old male with a history of uncontrolled type 2 diabetes mellitus (A1c 15.0), hypertension, atrial fibrillation (on anticoagulation with Xarelto), chronic back pain, insomnia who presents today for a pharmacy visit and was found to have hyperglycemia of 426.  He endorses not taking his Lantus, Byetta or NovoLog and had two meatball subs for lunch; it is now 2 hours post his lunch and 15 units of NovoLog has been administered. He has been noncompliant with his medications and sometimes does not take his Lantus, Byetta or NovoLog and has not been compliant with checking his sugars either.  Complains of numbness and burning pains in both feet; states he tried gabapentin in the past which did not help and he currently does not take anything for neuropathy. Also has chronic low back pain but complains of right-sided back pain  Past Medical History:  Diagnosis Date  . Acid reflux   . Acne rosacea   . Diabetes mellitus without complication (HCC) Dx 2010  . DVT (deep venous thrombosis) (HCC) 2012 and 2013    Left and Right leg, no history of PE    . Hyperlipidemia Dx 2010  . Hypertension Dx 2010  . Mood disorder (HCC)   . Plantar fasciitis   . Substance abuse    last alchohol intake 01/24/2013  . Thyroid disease     Past Surgical History:  Procedure Laterality Date  . PACEMAKER INSERTION  2014  . TEE WITHOUT CARDIOVERSION N/A 02/22/2016   Procedure: TRANSESOPHAGEAL ECHOCARDIOGRAM (TEE);  Surgeon: Chilton Si, MD;  Location: [facility] ENDOSCOPY;  Service: Cardiovascular;  Laterality: N/A;    Allergies  Allergen Reactions  . Penicillins Hives    Has patient had a PCN reaction causing immediate rash, facial/tongue/throat swelling, SOB or lightheadedness with hypotension: Yes Has patient had a PCN  reaction causing severe rash involving mucus membranes or skin necrosis: No Has patient had a PCN reaction that required hospitalization: No Has patient had a PCN reaction occurring within the last 10 years: No If all of the above answers are "NO", then may proceed with Cephalosporin use.   . Bacitracin Itching, Swelling and Rash  . Septra [Sulfamethoxazole-Trimethoprim] Itching, Swelling and Rash     Outpatient Medications Prior to Visit  Medication Sig Dispense Refill  . amLODipine (NORVASC) 10 MG tablet Take 1 tablet (10 mg total) by mouth daily. 30 tablet 11  . cephALEXin (KEFLEX) 500 MG capsule Take 2 capsules (1,000 mg total) by mouth 2 (two) times daily. 28 capsule 0  . cyclobenzaprine (FLEXERIL) 10 MG tablet Take 1 tablet (10 mg total) by mouth 3 (three) times daily as needed for muscle spasms. 30 tablet 0  . doxycycline (VIBRAMYCIN) 100 MG capsule Take 1 capsule (100 mg total) by mouth 2 (two) times daily. 20 capsule 0  . exenatide (BYETTA) 10 MCG/0.04ML SOPN injection Inject 0.04 mLs (10 mcg total) into the skin 2 (two) times daily with a meal. 2.4 mL 5  . folic acid (FOLVITE) 1 MG tablet Take 1 tablet (1 mg total) by mouth daily. 30 tablet 11  . glucose blood (TRUE METRIX BLOOD GLUCOSE TEST) test strip Use as instructed 100 each 12  . insulin aspart (NOVOLOG) 100 UNIT/ML injection Inject 30 Units into the skin 3 (three) times daily with meals. 10 mL 5  .  Insulin Glargine (LANTUS SOLOSTAR) 100 UNIT/ML Solostar Pen Inject 30 Units into the skin daily at 10 pm. 15 mL 5  . levothyroxine (SYNTHROID, LEVOTHROID) 50 MCG tablet Take 1 tablet (50 mcg total) by mouth daily before breakfast. 30 tablet 5  . Multiple Vitamin (MULTIVITAMIN WITH MINERALS) TABS tablet Take 1 tablet by mouth daily. 30 tablet 0  . ondansetron (ZOFRAN) 4 MG tablet Take 1 tablet (4 mg total) by mouth every 8 (eight) hours as needed for nausea or vomiting. 30 tablet 1  . oxyCODONE (ROXICODONE) 5 MG immediate release  tablet Take 1 tablet (5 mg total) by mouth every 4 (four) hours as needed for severe pain. 15 tablet 0  . potassium chloride SA (K-DUR,KLOR-CON) 20 MEQ tablet Take 2 tablets (40 mEq total) by mouth daily. 10 tablet 0  . rivaroxaban (XARELTO) 20 MG TABS tablet Take 1 tablet (20 mg total) by mouth daily with supper. 30 tablet 5  . thiamine 100 MG tablet Take 1 tablet (100 mg total) by mouth daily. 30 tablet 0  . zolpidem (AMBIEN) 10 MG tablet Take 1 tablet (10 mg total) by mouth at bedtime as needed for sleep. 30 tablet 2   No facility-administered medications prior to visit.     ROS Review of Systems  Constitutional: Negative for activity change and appetite change.  HENT: Negative for sinus pressure and sore throat.   Eyes: Negative for visual disturbance.  Respiratory: Negative for cough, chest tightness and shortness of breath.   Cardiovascular: Negative for chest pain and leg swelling.  Gastrointestinal: Negative for abdominal distention, abdominal pain, constipation and diarrhea.  Endocrine: Negative.   Genitourinary: Negative for dysuria.  Musculoskeletal: Positive for back pain. Negative for joint swelling and myalgias.  Skin: Negative for rash.  Allergic/Immunologic: Negative.   Neurological: Positive for numbness. Negative for weakness and light-headedness.  Psychiatric/Behavioral: Negative for dysphoric mood and suicidal ideas.    Objective:  BP 132/88 (BP Location: Right Arm, Patient Position: Sitting, Cuff Size: Small)   Pulse 78   Temp 98 F (36.7 C) (Oral)   Ht 6\' 1"  (1.854 m)   Wt 182 lb (82.6 kg)   SpO2 99%   BMI 24.01 kg/m   BP/Weight 03/24/2017 02/20/2017 02/15/2017  Systolic BP 132 123 104  Diastolic BP 88 79 91  Wt. (Lbs) 182 179 188  BMI 24.01 23.62 24.8      Physical Exam  Constitutional: He is oriented to person, place, and time. He appears well-developed and well-nourished.  Cardiovascular: Normal rate, normal heart sounds and intact distal pulses.    No murmur heard. Pulmonary/Chest: Effort normal and breath sounds normal. He has no wheezes. He has no rales. He exhibits no tenderness.  Abdominal: Soft. Bowel sounds are normal. He exhibits no distension and no mass. There is no tenderness.  Musculoskeletal: He exhibits tenderness (tenderness on palpation of right side of back and on range of motion).  Neurological: He is alert and oriented to person, place, and time.  Skin: Skin is warm and dry.  Psychiatric: He has a normal mood and affect.     Lab Results  Component Value Date   HGBA1C 13.0 02/20/2017    Assessment & Plan:   1. Uncontrolled type 2 diabetes mellitus with complication, with long-term current use of insulin (HCC) Uncontrolled with A1c of 13.0 Hyperglycemia of 426; 15 units of NovoLog administered and blood sugar rechecked Poor compliance - patient takes medication sporadically He is on a significantly high dose of NovoLog  which can cause severe hypoglycemia I have provided him with a NovoLog sliding scale to use until his visit with his PCP at which time fixed dose of NovoLog will be decided as well as need to increase dose of Lantus. Meanwhile continue Lantus at current dose as well as Byetta NovoLog scale: 0-150 equals 0 units of insulin, 151-200 equals 2 units, 201-250 equals 4 units, 251-300 equals 6 units, 301-350 equals 8 units, 351-400 equals 10 units, greater than 400 equals 12 units. - Glucose (CBG) - insulin aspart (novoLOG) injection 15 Units; Inject 0.15 mLs (15 Units total) into the skin once.  2. Diabetic peripheral neuropathy associated with type 2 diabetes mellitus (HCC) Placed on gabapentin  If he fails this he may need to be evaluated for Lyrica Improvement in glycemic control will also help with symptoms  3. Chronic right-sided low back pain without sciatica Currently on Flexeril Advised to apply heat Hopefully gabapentin will help with symptoms.   Meds ordered this encounter    Medications  . insulin aspart (novoLOG) injection 15 Units  . gabapentin (NEURONTIN) 300 MG capsule    Sig: Take 1 capsule (300 mg total) by mouth 2 (two) times daily.    Dispense:  60 capsule    Refill:  3  . glucose blood (ACCU-CHEK AVIVA) test strip    Sig: Use as instructed 3 times daily    Dispense:  100 each    Refill:  12    Follow-up: Return in about 3 weeks (around 04/14/2017) for Follow-up on diabetes mellitus with Dr Armen PickupFunches.   Jaclyn ShaggyEnobong Amao MD

## 2017-03-24 NOTE — Patient Instructions (Signed)
Diabetic Neuropathy Diabetic neuropathy is a nerve disease or nerve damage that is caused by diabetes mellitus. About half of all people with diabetes mellitus have some form of nerve damage. Nerve damage is more common in those who have had diabetes mellitus for many years and who generally have not had good control of their blood sugar (glucose) level. Diabetic neuropathy is a common complication of diabetes mellitus. There are three common types of diabetic neuropathy and a fourth type that is less common and less understood:  Peripheral neuropathy-This is the most common type of diabetic neuropathy. It causes damage to the nerves of the feet and legs first and then eventually the hands and arms. The damage affects the ability to sense touch.  Autonomic neuropathy-This type causes damage to the autonomic nervous system, which controls the following functions: ? Heartbeat. ? Body temperature. ? Blood pressure. ? Urination. ? Digestion. ? Sweating. ? Sexual function.  Focal neuropathy-Focal neuropathy can be painful and unpredictable and occurs most often in older adults with diabetes mellitus. It involves a specific nerve or one area and often comes on suddenly. It usually does not cause long-term problems.  Radiculoplexus neuropathy- Sometimes called lumbosacral radiculoplexus neuropathy, radiculoplexus neuropathy affects the nerves of the thighs, hips, buttocks, or legs. It is more common in people with type 2 diabetes mellitus and in older men. It is characterized by debilitating pain, weakness, and atrophy, usually in the thigh muscles.  What are the causes? The cause of peripheral, autonomic, and focal neuropathies is diabetes mellitus that is uncontrolled and high glucose levels. The cause of radiculoplexus neuropathy is unknown. However, it is thought to be caused by inflammation related to uncontrolled glucose levels. What are the signs or symptoms? Peripheral Neuropathy Peripheral  neuropathy develops slowly over time. When the nerves of the feet and legs no longer work there may be:  Burning, stabbing, or aching pain in the legs or feet.  Inability to feel pressure or pain in your feet. This can lead to: ? Thick calluses over pressure areas. ? Pressure sores. ? Ulcers.  Foot deformities.  Reduced ability to feel temperature changes.  Muscle weakness.  Autonomic Neuropathy The symptoms of autonomic neuropathy vary depending on which nerves are affected. Symptoms may include:  Problems with digestion, such as: ? Feeling sick to your stomach (nausea). ? Vomiting. ? Bloating. ? Constipation. ? Diarrhea. ? Abdominal pain.  Difficulty with urination. This occurs if you lose your ability to sense when your bladder is full. Problems include: ? Urine leakage (incontinence). ? Inability to empty your bladder completely (retention).  Rapid or irregular heartbeat (palpitations).  Blood pressure drops when you stand up (orthostatic hypotension). When you stand up you may feel: ? Dizzy. ? Weak. ? Faint.  In men, inability to attain and maintain an erection.  In women, vaginal dryness and problems with decreased sexual desire and arousal.  Problems with body temperature regulation.  Increased or decreased sweating.  Focal Neuropathy  Abnormal eye movements or abnormal alignment of both eyes.  Weakness in the wrist.  Foot drop. This results in an inability to lift the foot properly and abnormal walking or foot movement.  Paralysis on one side of your face (Bell palsy).  Chest or abdominal pain. Radiculoplexus Neuropathy  Sudden, severe pain in your hip, thigh, or buttocks.  Weakness and wasting of thigh muscles.  Difficulty rising from a seated position.  Abdominal swelling.  Unexplained weight loss (usually more than 10 lb [4.5 kg]). How is   this diagnosed? Peripheral Neuropathy Your senses may be tested. Sensory function testing can be  done with:  A light touch using a monofilament.  A vibration with tuning fork.  A sharp sensation with a pin prick.  Other tests that can help diagnose neuropathy are:  Nerve conduction velocity. This test checks the transmission of an electrical current through a nerve.  Electromyography. This shows how muscles respond to electrical signals transmitted by nearby nerves.  Quantitative sensory testing. This is used to assess how your nerves respond to vibrations and changes in temperature.  Autonomic Neuropathy Diagnosis is often based on reported symptoms. Tell your health care provider if you experience:  Dizziness.  Constipation.  Diarrhea.  Inappropriate urination or inability to urinate.  Inability to get or maintain an erection.  Tests that may be done include:  Electrocardiography or Holter monitor. These are tests that can help show problems with the heart rate or heart rhythm.  An X-ray exam may be done.  Focal Neuropathy Diagnosis is made based on your symptoms and what your health care provider finds during your exam. Other tests may be done. They may include:  Nerve conduction velocities. This checks the transmission of electrical current through a nerve.  Electromyography. This shows how muscles respond to electrical signals transmitted by nearby nerves.  Quantitative sensory testing. This test is used to assess how your nerves respond to vibration and changes in temperature.  Radiculoplexus Neuropathy  Often the first thing is to eliminate any other issue or problems that might be the cause, as there is no standard test for diagnosis.  X-ray exam of your spine and lumbar region.  Spinal tap to rule out cancer.  MRI to rule out other lesions. How is this treated? Once nerve damage occurs, it cannot be reversed. The goal of treatment is to keep the disease or nerve damage from getting worse and affecting more nerve fibers. Controlling your blood  glucose level is the key. Most people with radiculoplexus neuropathy see at least a partial improvement over time. You will need to keep your blood glucose and HbA1c levels in the target range determined by your health care provider. Things that help control blood glucose levels include:  Blood glucose monitoring.  Meal planning.  Physical activity.  Diabetes medicine.  Over time, maintaining lower blood glucose levels helps lessen symptoms. Sometimes, prescription pain medicine is needed. Follow these instructions at home:  Do not smoke.  Keep your blood glucose level in the range that you and your health care provider have determined acceptable for you.  Keep your blood pressure level in the range that you and your health care provider have determined acceptable for you.  Eat a well-balanced diet.  Be physically active every day. Include strength training and balance exercises.  Protect your feet. ? Check your feet every day for sores, cuts, blisters, or signs of infection. ? Wear padded socks and supportive shoes. Use orthotic inserts, if necessary. ? Regularly check the insides of your shoes for worn spots. Make sure there are no rocks or other items inside your shoes before you put them on. Contact a health care provider if:  You have burning, stabbing, or aching pain in the legs or feet.  You are unable to feel pressure or pain in your feet.  You develop problems with digestion such as: ? Nausea. ? Vomiting. ? Bloating. ? Constipation. ? Diarrhea. ? Abdominal pain.  You have difficulty with urination, such as: ? Incontinence. ? Retention.    You have palpitations.  You develop orthostatic hypotension. When you stand up you may feel: ? Dizzy. ? Weak. ? Faint.  You cannot attain and maintain an erection (in men).  You have vaginal dryness and problems with decreased sexual desire and arousal (in women).  You have severe pain in your thighs, legs, or  buttocks.  You have unexplained weight loss. This information is not intended to replace advice given to you by your health care provider. Make sure you discuss any questions you have with your health care provider. Document Released: 02/23/2002 Document Revised: 05/22/2016 Document Reviewed: 05/26/2013 Elsevier Interactive Patient Education  2017 [employer]  

## 2017-03-24 NOTE — Progress Notes (Signed)
    S:   Patient arrives in good spirits for diabetes management. He was referred by Dr. Armen PickupFunches on 02/20/17.  He reports that he has not taken his medications today.  He reports eating two meatball subs a few hours ago for lunch.   O:  Physical Exam   ROS   CBG = 426  A/P: Diabetes, uncontrolled with current CBG of 426. Patient to be worked in to see Dr. Venetia NightAmao today for further management.

## 2017-04-03 ENCOUNTER — Ambulatory Visit: Payer: Self-pay | Admitting: Family Medicine

## 2017-04-21 ENCOUNTER — Ambulatory Visit: Payer: Medicaid Other | Attending: Family Medicine | Admitting: Family Medicine

## 2017-04-21 ENCOUNTER — Encounter: Payer: Self-pay | Admitting: Family Medicine

## 2017-04-21 VITALS — BP 105/71 | HR 75 | Temp 98.2°F

## 2017-04-21 DIAGNOSIS — I10 Essential (primary) hypertension: Secondary | ICD-10-CM | POA: Diagnosis not present

## 2017-04-21 DIAGNOSIS — Z794 Long term (current) use of insulin: Secondary | ICD-10-CM | POA: Insufficient documentation

## 2017-04-21 DIAGNOSIS — Z79899 Other long term (current) drug therapy: Secondary | ICD-10-CM | POA: Insufficient documentation

## 2017-04-21 DIAGNOSIS — E1142 Type 2 diabetes mellitus with diabetic polyneuropathy: Secondary | ICD-10-CM | POA: Insufficient documentation

## 2017-04-21 DIAGNOSIS — G4733 Obstructive sleep apnea (adult) (pediatric): Secondary | ICD-10-CM

## 2017-04-21 DIAGNOSIS — M545 Low back pain: Secondary | ICD-10-CM | POA: Diagnosis not present

## 2017-04-21 DIAGNOSIS — Z86718 Personal history of other venous thrombosis and embolism: Secondary | ICD-10-CM | POA: Diagnosis not present

## 2017-04-21 DIAGNOSIS — Z7901 Long term (current) use of anticoagulants: Secondary | ICD-10-CM | POA: Insufficient documentation

## 2017-04-21 DIAGNOSIS — E039 Hypothyroidism, unspecified: Secondary | ICD-10-CM | POA: Diagnosis not present

## 2017-04-21 DIAGNOSIS — H538 Other visual disturbances: Secondary | ICD-10-CM | POA: Diagnosis not present

## 2017-04-21 DIAGNOSIS — IMO0002 Reserved for concepts with insufficient information to code with codable children: Secondary | ICD-10-CM

## 2017-04-21 DIAGNOSIS — G8929 Other chronic pain: Secondary | ICD-10-CM | POA: Diagnosis not present

## 2017-04-21 DIAGNOSIS — F17210 Nicotine dependence, cigarettes, uncomplicated: Secondary | ICD-10-CM | POA: Diagnosis not present

## 2017-04-21 DIAGNOSIS — G4730 Sleep apnea, unspecified: Secondary | ICD-10-CM | POA: Insufficient documentation

## 2017-04-21 DIAGNOSIS — E1165 Type 2 diabetes mellitus with hyperglycemia: Secondary | ICD-10-CM | POA: Diagnosis not present

## 2017-04-21 DIAGNOSIS — F1011 Alcohol abuse, in remission: Secondary | ICD-10-CM | POA: Diagnosis not present

## 2017-04-21 LAB — POCT URINALYSIS DIPSTICK
Bilirubin, UA: NEGATIVE
Blood, UA: NEGATIVE
Glucose, UA: 500
KETONES UA: NEGATIVE
LEUKOCYTES UA: NEGATIVE
Nitrite, UA: NEGATIVE
PH UA: 5.5 (ref 5.0–8.0)
PROTEIN UA: NEGATIVE
Spec Grav, UA: <=1.005 — AB (ref 1.010–1.025)
UROBILINOGEN UA: 0.2 U/dL

## 2017-04-21 LAB — POCT UA - MICROALBUMIN
CREATININE, POC: 10 mg/dL
Microalbumin Ur, POC: 10 mg/L

## 2017-04-21 LAB — GLUCOSE, POCT (MANUAL RESULT ENTRY)

## 2017-04-21 LAB — POCT GLYCOSYLATED HEMOGLOBIN (HGB A1C): HEMOGLOBIN A1C: 13.7

## 2017-04-21 MED ORDER — INSULIN GLARGINE 100 UNIT/ML SOLOSTAR PEN: 45.0000 [IU] | PEN_INJECTOR | Freq: Every day | SUBCUTANEOUS | 5 refills | Status: DC

## 2017-04-21 MED ORDER — INSULIN ASPART 100 UNIT/ML ~~LOC~~ SOLN
20.0000 [IU] | Freq: Once | SUBCUTANEOUS | Status: AC
  Administered 2017-04-21: 20 [IU] via SUBCUTANEOUS

## 2017-04-21 MED ORDER — GABAPENTIN 300 MG PO CAPS: ORAL_CAPSULE | ORAL | 3 refills | Status: DC

## 2017-04-21 NOTE — Progress Notes (Signed)
Subjective:  Patient ID: Patrick Small, male    DOB: 1961/05/25  Age: 56 y.o. MRN: [id number]  CC: Diabetes   HPI Pervis Trickett diabetes, HTN, hypothyroidism,  chronic low back pain, hx of DVT on xarelto, hx of alcohol abuse in remission now, OSA no CPAP, he presents for   1. CHRONIC DIABETES  Disease Monitoring  Blood Sugar Ranges:   Fasting:   Postprandial:   Polyuria: no   Visual problems: yes   Medication Compliance: yes  Medication Side Effects  Hypoglycemia: no   Reports feeling "high" and blurry vision. Eating peanut butter and jelly sandwiches and fruit cocktail in a can.   Social History  Substance Use Topics  . Smoking status: Current Every Day Smoker    Packs/day: 6.00    Types: Cigarettes  . Smokeless tobacco: Never Used  . Alcohol use 0.0 oz/week     Comment: only on weekends    Outpatient Medications Prior to Visit  Medication Sig Dispense Refill  . amLODipine (NORVASC) 10 MG tablet Take 1 tablet (10 mg total) by mouth daily. 30 tablet 11  . cephALEXin (KEFLEX) 500 MG capsule Take 2 capsules (1,000 mg total) by mouth 2 (two) times daily. 28 capsule 0  . cyclobenzaprine (FLEXERIL) 10 MG tablet Take 1 tablet (10 mg total) by mouth 3 (three) times daily as needed for muscle spasms. 30 tablet 0  . doxycycline (VIBRAMYCIN) 100 MG capsule Take 1 capsule (100 mg total) by mouth 2 (two) times daily. 20 capsule 0  . exenatide (BYETTA) 10 MCG/0.04ML SOPN injection Inject 0.04 mLs (10 mcg total) into the skin 2 (two) times daily with a meal. 2.4 mL 5  . folic acid (FOLVITE) 1 MG tablet Take 1 tablet (1 mg total) by mouth daily. 30 tablet 11  . gabapentin (NEURONTIN) 300 MG capsule Take 1 capsule (300 mg total) by mouth 2 (two) times daily. 60 capsule 3  . glucose blood (ACCU-CHEK AVIVA) test strip Use as instructed 3 times daily 100 each 12  . glucose blood (TRUE METRIX BLOOD GLUCOSE TEST) test strip Use as instructed 100 each 12  . insulin aspart (NOVOLOG) 100  UNIT/ML injection Inject 30 Units into the skin 3 (three) times daily with meals. 10 mL 5  . Insulin Glargine (LANTUS SOLOSTAR) 100 UNIT/ML Solostar Pen Inject 30 Units into the skin daily at 10 pm. 15 mL 5  . levothyroxine (SYNTHROID, LEVOTHROID) 50 MCG tablet Take 1 tablet (50 mcg total) by mouth daily before breakfast. 30 tablet 5  . Multiple Vitamin (MULTIVITAMIN WITH MINERALS) TABS tablet Take 1 tablet by mouth daily. 30 tablet 0  . ondansetron (ZOFRAN) 4 MG tablet Take 1 tablet (4 mg total) by mouth every 8 (eight) hours as needed for nausea or vomiting. 30 tablet 1  . oxyCODONE (ROXICODONE) 5 MG immediate release tablet Take 1 tablet (5 mg total) by mouth every 4 (four) hours as needed for severe pain. 15 tablet 0  . potassium chloride SA (K-DUR,KLOR-CON) 20 MEQ tablet Take 2 tablets (40 mEq total) by mouth daily. 10 tablet 0  . rivaroxaban (XARELTO) 20 MG TABS tablet Take 1 tablet (20 mg total) by mouth daily with supper. 30 tablet 5  . thiamine 100 MG tablet Take 1 tablet (100 mg total) by mouth daily. 30 tablet 0  . zolpidem (AMBIEN) 10 MG tablet Take 1 tablet (10 mg total) by mouth at bedtime as needed for sleep. 30 tablet 2   No facility-administered medications prior to  visit.     ROS Review of Systems  Constitutional: Negative for chills, fatigue, fever and unexpected weight change.  Eyes: Positive for visual disturbance.  Respiratory: Negative for cough and shortness of breath.   Cardiovascular: Negative for chest pain, palpitations and leg swelling.  Gastrointestinal: Negative for abdominal pain, blood in stool, constipation, diarrhea, nausea and vomiting.  Endocrine: Negative for polydipsia, polyphagia and polyuria.  Musculoskeletal: Positive for arthralgias and back pain. Negative for gait problem, myalgias and neck pain.  Skin: Negative for rash.  Allergic/Immunologic: Negative for immunocompromised state.  Hematological: Negative for adenopathy. Does not bruise/bleed  easily.  Psychiatric/Behavioral: Negative for dysphoric mood, sleep disturbance and suicidal ideas. The patient is not nervous/anxious.     Objective:  BP 105/71   Pulse 75   Temp 98.2 F (36.8 C) (Oral)   SpO2 98%   BP/Weight 04/21/2017 03/24/2017 02/20/2017  Systolic BP 105 132 123  Diastolic BP 71 88 79  Wt. (Lbs) - 182 179  BMI - 24.01 23.62    Physical Exam  Constitutional: He appears well-developed and well-nourished. No distress.  HENT:  Head: Normocephalic and atraumatic.  Neck: Normal range of motion. Neck supple.  Cardiovascular: Normal rate, regular rhythm, normal heart sounds and intact distal pulses.   Pulmonary/Chest: Effort normal and breath sounds normal.  Musculoskeletal: He exhibits no edema.  Neurological: He is alert.  Skin: Skin is warm and dry. No rash noted. No erythema.  Psychiatric: He has a normal mood and affect.   CBG HI Lab Results  Component Value Date   HGBA1C 13.0 02/20/2017    Lab Results  Component Value Date   TSH 4.18 11/04/2016    Treated with novolog 20 U x one UA: 500 glucose, negative ketones  Repeat CBG HI   Assessment & Plan:   Carlon was seen today for diabetes.  Diagnoses and all orders for this visit:  Uncontrolled type 2 diabetes mellitus with diabetic polyneuropathy, with long-term current use of insulin (HCC) -     POCT glucose (manual entry) -     POCT glycosylated hemoglobin (Hb A1C) -     POCT urinalysis dipstick -     insulin aspart (novoLOG) injection 20 Units; Inject 0.2 mLs (20 Units total) into the skin once. -     Ambulatory referral to Endocrinology -     Cancel: Microalbumin/Creatinine Ratio, Urine -     Insulin Glargine (LANTUS SOLOSTAR) 100 UNIT/ML Solostar Pen; Inject 45 Units into the skin daily at 10 pm. -     POCT UA - Microalbumin  Diabetic peripheral neuropathy associated with type 2 diabetes mellitus (HCC) -     gabapentin (NEURONTIN) 300 MG capsule; Take 300 mg in the morning and 600 mg  before bed  Chronic right-sided low back pain without sciatica -     gabapentin (NEURONTIN) 300 MG capsule; Take 300 mg in the morning and 600 mg before bed -     Ambulatory referral to Physical Therapy  Obstructive sleep apnea syndrome     Meds ordered this encounter  Medications  . insulin aspart (novoLOG) injection 20 Units    Follow-up: Return in about 6 weeks (around 06/02/2017) for diabetes .   Dessa Phi MD

## 2017-04-21 NOTE — Patient Instructions (Addendum)
Willmar was seen today for diabetes.  Diagnoses and all orders for this visit:  Uncontrolled type 2 diabetes mellitus with diabetic polyneuropathy, with long-term current use of insulin (HCC) -     POCT glucose (manual entry) -     POCT glycosylated hemoglobin (Hb A1C) -     POCT urinalysis dipstick -     insulin aspart (novoLOG) injection 20 Units; Inject 0.2 mLs (20 Units total) into the skin once. -     Ambulatory referral to Endocrinology -     Microalbumin/Creatinine Ratio, Urine -     Insulin Glargine (LANTUS SOLOSTAR) 100 UNIT/ML Solostar Pen; Inject 45 Units into the skin daily at 10 pm.  Diabetic peripheral neuropathy associated with type 2 diabetes mellitus (HCC) -     gabapentin (NEURONTIN) 300 MG capsule; Take 300 mg in the morning and 600 mg before bed  Chronic right-sided low back pain without sciatica -     gabapentin (NEURONTIN) 300 MG capsule; Take 300 mg in the morning and 600 mg before bed -     Ambulatory referral to Physical Therapy  Obstructive sleep apnea syndrome  You will be called about referrals  Call the dentist to postpone extractions for 2 weeks for now  Return in one week for CBG check with clinical pharmacologist with plan to increase lantus to 60 U if needed f/u with me in 6 weeks for diabetes   Dr. Armen Pickup

## 2017-04-23 NOTE — Assessment & Plan Note (Signed)
Chronic pain Gabapentin 300 mg in AM and 600 mg in PM Physical therapy

## 2017-04-23 NOTE — Assessment & Plan Note (Signed)
Chronic diabetes uncontrolled with hyperglycemia Treated with insulin in office No ketones in urine, no pending DKA  Plan: lantus 45 U daily, increase from 30 U Patient had diarrhea in the past with metformin, refuses XR Endocrinology referral

## 2017-04-28 ENCOUNTER — Ambulatory Visit: Payer: Self-pay | Admitting: Pharmacist

## 2017-04-28 NOTE — Progress Notes (Deleted)
    S:     No chief complaint on file.   Patient arrives ***.  Presents for diabetes evaluation, education, and management at the request of Dr. Armen Pickup. Patient was referred on 04/21/17.  Patient was last seen by Primary Care Provider on 04/21/17.   Patient {Actions; denies-reports:120008} adherence with medications.  Current diabetes medications include: Lantus 45 units daily, Novolog 30 TID, Byetta 10 mcg BID. Had diarrhea with metformin in the past and refuses XR  Patient {Actions; denies-reports:120008} hypoglycemic events.  Patient reported dietary habits: Eats *** meals/day Breakfast:*** Lunch:*** Dinner:*** Snacks:*** Drinks:***  Patient reported exercise habits:    Patient {Actions; denies-reports:120008} nocturia.  Patient {Actions; denies-reports:120008} neuropathy. Patient {Actions; denies-reports:120008} visual changes. Patient {Actions; denies-reports:120008} self foot exams.    O:  Physical Exam   ROS   Lab Results  Component Value Date   HGBA1C 13.7 04/21/2017   There were no vitals filed for this visit.  Home fasting CBG: ***  2 hour post-prandial/random CBG: ***.  10 year ASCVD risk: ***.  A/P: Diabetes longstanding currently UNcontrolled based on A1c of 13.7. Patient {Actions; denies-reports:120008} hypoglycemic events and is able to verbalize appropriate hypoglycemia management plan. Patient {Actions; denies-reports:120008} adherence with medication. Control is suboptimal due to ***.  Per Dr. Armen Pickup, increase Lantus to 60 units daily.   Next A1C anticipated July 2018.    Written patient instructions provided.  Total time in face to face counseling *** minutes.   Follow up in Pharmacist Clinic Visit ***.   Next visit with Dr. Armen Pickup 06/02/17

## 2017-04-30 ENCOUNTER — Ambulatory Visit: Payer: Medicaid Other | Attending: Family Medicine

## 2017-05-13 ENCOUNTER — Encounter: Payer: Self-pay | Admitting: Family Medicine

## 2017-05-19 ENCOUNTER — Other Ambulatory Visit: Payer: Self-pay | Admitting: *Deleted

## 2017-05-19 MED ORDER — LEVOTHYROXINE SODIUM 50 MCG PO TABS: 50.0000 ug | ORAL_TABLET | Freq: Every day | ORAL | 4 refills | Status: DC

## 2017-05-21 ENCOUNTER — Other Ambulatory Visit: Payer: Self-pay | Admitting: Family Medicine

## 2017-05-21 DIAGNOSIS — G4700 Insomnia, unspecified: Secondary | ICD-10-CM

## 2017-05-21 MED ORDER — ZOLPIDEM TARTRATE 10 MG PO TABS: 10.0000 mg | ORAL_TABLET | Freq: Every evening | ORAL | 2 refills | Status: DC | PRN

## 2017-06-02 ENCOUNTER — Ambulatory Visit: Payer: Self-pay | Admitting: Family Medicine

## 2017-06-15 ENCOUNTER — Telehealth: Payer: Self-pay | Admitting: Family Medicine

## 2017-06-15 NOTE — Telephone Encounter (Signed)
Pt. Came to facility requesting to speak with PCP. Pt. States that his PCP needs to call Psychologist, clinicalTyra Clymer from [facility]. Pt. States she needs help with assistance but was not able to give a lot of information. A copy of Tyra Clymer information will be put in PCP box. Please f/u with pt.

## 2017-06-15 NOTE — Telephone Encounter (Signed)
Please call Personal assistantTyra Clymer  Director of Emergency Assistance  [facility]  and inquire about needs Phone # [phone] Fax [phone]

## 2017-06-15 NOTE — Telephone Encounter (Signed)
Will route to PCP 

## 2017-07-24 ENCOUNTER — Encounter: Payer: Self-pay | Admitting: Physician Assistant

## 2017-07-24 NOTE — Progress Notes (Deleted)
Cardiology Office Note Date:  07/24/2017  Patient ID:  Patrick Small, DOB 08/29/1961, MRN [id number] PCP:  Dessa PhiFunches, Josalyn, MD  Cardiologist:  Dr. Elberta Fortisamnitz  ***refresh   Chief Complaint: annual visit  History of Present Illness: Patrick BombardDarryl Mattice is a 56 y.o. 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  TEE demonstrated vegetation vs thrombin. Without symptoms of infection, device extraction was not recommended. Blood cultures were negative early 2017.  He comes in today to be seen for Dr. Elberta Fortisamnitz, last saw A. Glory BuffSeiler last year in May, no changes were made to his tx.  *** symptoms ** bleeding/xarelto *** labs? lipids *** smoking?   Device History: BSX dual chamber PPM [place]  Past Medical History:  Diagnosis Date  . Acid reflux   . Acne rosacea   . Diabetes mellitus without complication (HCC) Dx 2010  . DVT (deep venous thrombosis) (HCC) 2012 and 2013    Left and Right leg, no history of PE    . Hyperlipidemia Dx 2010  . Hypertension Dx 2010  . Mood disorder (HCC)   . Plantar fasciitis   . Substance abuse    last alchohol intake 01/24/2013  . Thyroid disease     Past Surgical History:  Procedure Laterality Date  . PACEMAKER INSERTION  2014  . TEE WITHOUT CARDIOVERSION N/A 02/22/2016   Procedure: TRANSESOPHAGEAL ECHOCARDIOGRAM (TEE);  Surgeon: Chilton Siiffany [place], MD;  [facility];  Service: Cardiovascular;  Laterality: N/A;    Current Outpatient Prescriptions  Medication Sig Dispense Refill  . amLODipine (NORVASC) 10 MG tablet Take 1 tablet (10 mg total) by mouth daily. 30 tablet 11  . cyclobenzaprine (FLEXERIL) 10 MG tablet Take 1 tablet (10 mg total) by mouth 3 (three) times daily as needed for muscle spasms. 30 tablet 0  . exenatide (BYETTA) 10 MCG/0.04ML SOPN injection Inject 0.04 mLs (10 mcg total) into the skin 2 (two) times daily with a  meal. 2.4 mL 5  . folic acid (FOLVITE) 1 MG tablet Take 1 tablet (1 mg total) by mouth daily. 30 tablet 11  . gabapentin (NEURONTIN) 300 MG capsule Take 300 mg in the morning and 600 mg before bed 90 capsule 3  . glucose blood (ACCU-CHEK AVIVA) test strip Use as instructed 3 times daily 100 each 12  . glucose blood (TRUE METRIX BLOOD GLUCOSE TEST) test strip Use as instructed 100 each 12  . insulin aspart (NOVOLOG) 100 UNIT/ML injection Inject 30 Units into the skin 3 (three) times daily with meals. 10 mL 5  . Insulin Glargine (LANTUS SOLOSTAR) 100 UNIT/ML Solostar Pen Inject 45 Units into the skin daily at 10 pm. 15 mL 5  . levothyroxine (SYNTHROID, LEVOTHROID) 50 MCG tablet Take 1 tablet (50 mcg total) by mouth daily before breakfast. 30 tablet 4  . Multiple Vitamin (MULTIVITAMIN WITH MINERALS) TABS tablet Take 1 tablet by mouth daily. 30 tablet 0  . ondansetron (ZOFRAN) 4 MG tablet Take 1 tablet (4 mg total) by mouth every 8 (eight) hours as needed for nausea or vomiting. 30 tablet 1  . potassium chloride SA (K-DUR,KLOR-CON) 20 MEQ tablet Take 2 tablets (40 mEq total) by mouth daily. 10 tablet 0  . rivaroxaban (XARELTO) 20 MG TABS tablet Take 1 tablet (20 mg total) by mouth daily with supper. 30 tablet 5  . thiamine 100 MG tablet Take 1 tablet (100 mg total)  by mouth daily. 30 tablet 0  . zolpidem (AMBIEN) 10 MG tablet Take 1 tablet (10 mg total) by mouth at bedtime as needed for sleep. 30 tablet 2   No current facility-administered medications for this visit.     Allergies:   Penicillins; Bacitracin; and Septra [sulfamethoxazole-trimethoprim]   Social History:  The patient  reports that he has been smoking Cigarettes.  He has been smoking about 6.00 packs per day. He has never used smokeless tobacco. He reports that he drinks alcohol. He reports that he uses drugs, including Marijuana.   Family History:  The patient's family history includes Diabetes in his father, mother, and  sister.  ROS:  Please see the history of present illness.  All other systems are reviewed and otherwise negative.   PHYSICAL EXAM: *** VS:  There were no vitals taken for this visit. BMI: There is no height or weight on file to calculate BMI. Well nourished, well developed, in no acute distress  HEENT: normocephalic, atraumatic  Neck: no JVD, carotid bruits or masses Cardiac:  *** RRR; no significant murmurs, no rubs, or gallops Lungs:  *** CTA b/l, no wheezing, rhonchi or rales  Abd: soft, nontender MS: no deformity or *** atrophy Ext: *** no edema  Skin: warm and dry, no rash Neuro:  No gross deficits appreciated Psych: euthymic mood, full affect  *** PPM site is stable, no tethering or discomfort   EKG:  Done today shows *** PPM interrogation done today by industry and reviewed by myself: ***   02/22/16: TEE Study Conclusions - Left ventricle: Systolic function was normal. The estimated   ejection fraction was in the range of 55% to 60%. Wall motion was   normal; there were no regional wall motion abnormalities. - Mitral valve: Mildly calcified annulus. - Left atrium: No evidence of thrombus in the atrial cavity or   appendage. No evidence of thrombus in the atrial cavity or   appendage. - Right ventricle: Pacer wire or catheter noted in right ventricle   with two mobile targets on the RA side of the lead. There is a   0.37 cm x 0.86 cm target superiorly and a 2.2 cm x 0.39 cm lesion   closer to the valve. - Right atrium: Pacer wire or catheter noted in right atrium. No   evidence of thrombus in the atrial cavity or appendage. - Atrial septum: No defect or patent foramen ovale was identified   by color flow Doppler. Impressions - There is a 2.2 cm x 0.4 cm mobile target on the RV pacemaker lead   as well as a 0.4 x 0.9cm target supereiorly on the same lead. The   RA lead is unaffected. The differential includes thrombus, fibrin   sheath and vegetation.   Recent  Labs: 10/18/2016: Magnesium 1.5 11/04/2016: TSH 4.18 02/15/2017: Hemoglobin 14.5; Platelets 144 02/20/2017: ALT 31; BUN 5; Creat 0.94; Potassium 3.2; Sodium 129  No results found for requested labs within last 8760 hours.   CrCl cannot be calculated (Patient's most recent lab result is older than the maximum 21 days allowed.).   Wt Readings from Last 3 Encounters:  03/24/17 182 lb (82.6 kg)  02/20/17 179 lb (81.2 kg)  02/15/17 188 lb (85.3 kg)     Other studies reviewed: Additional studies/records reviewed today include: summarized above  ASSESSMENT AND PLAN:  1. PPM (SSSx)     ***  2. HTN     ***  3.    Disposition: F/u with ***  Current medicines are reviewed at length with the patient today.  The patient did not have any concerns regarding medicines.***  Signed, Sherrilee Gilles, PA-C 07/24/2017 5:55 AM     [facility] [address] [place] [address] [phone] (office)  [phone] (fax)

## 2017-08-02 NOTE — Progress Notes (Deleted)
Cardiology Office Note Date:  08/02/2017  Patient ID:  Patrick Small, DOB 11/10/1961, MRN [id number] PCP:  Dessa PhiFunches, Josalyn, MD  Cardiologist:  Dr. Elberta Fortisamnitz  ***refresh   Chief Complaint: annual visit  History of Present Illness: Patrick Small is a 56 y.o. 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  TEE demonstrated vegetation vs thrombin. Without symptoms of infection, device extraction was not recommended. Blood cultures were negative early 2017.  He comes in today to be seen for Dr. Elberta Fortisamnitz, last saw A. Glory BuffSeiler last year in May, no changes were made to his tx.  *** symptoms ** bleeding/xarelto *** labs? lipids *** smoking?   Device History: BSX dual chamber PPM implanted 2014 [place]SS [place])  Past Medical History:  Diagnosis Date  . Acid reflux   . Acne rosacea   . Diabetes mellitus without complication (HCC) Dx 2010  . DVT (deep venous thrombosis) (HCC) 2012 and 2013    Left and Right leg, no history of PE    . Hyperlipidemia Dx 2010  . Hypertension Dx 2010  . Mood disorder (HCC)   . Plantar fasciitis   . Substance abuse    last alchohol intake 01/24/2013  . Thyroid disease     Past Surgical History:  Procedure Laterality Date  . PACEMAKER INSERTION  2014  . TEE WITHOUT CARDIOVERSION N/A 02/22/2016   Procedure: TRANSESOPHAGEAL ECHOCARDIOGRAM (TEE);  Surgeon: Chilton Siiffany [place], MD;  [facility];  Service: Cardiovascular;  Laterality: N/A;    Current Outpatient Prescriptions  Medication Sig Dispense Refill  . amLODipine (NORVASC) 10 MG tablet Take 1 tablet (10 mg total) by mouth daily. 30 tablet 11  . cyclobenzaprine (FLEXERIL) 10 MG tablet Take 1 tablet (10 mg total) by mouth 3 (three) times daily as needed for muscle spasms. 30 tablet 0  . exenatide (BYETTA) 10 MCG/0.04ML SOPN injection Inject 0.04 mLs (10 mcg total) into the skin 2 (two) times daily with a  meal. 2.4 mL 5  . folic acid (FOLVITE) 1 MG tablet Take 1 tablet (1 mg total) by mouth daily. 30 tablet 11  . gabapentin (NEURONTIN) 300 MG capsule Take 300 mg in the morning and 600 mg before bed 90 capsule 3  . glucose blood (ACCU-CHEK AVIVA) test strip Use as instructed 3 times daily 100 each 12  . glucose blood (TRUE METRIX BLOOD GLUCOSE TEST) test strip Use as instructed 100 each 12  . insulin aspart (NOVOLOG) 100 UNIT/ML injection Inject 30 Units into the skin 3 (three) times daily with meals. 10 mL 5  . Insulin Glargine (LANTUS SOLOSTAR) 100 UNIT/ML Solostar Pen Inject 45 Units into the skin daily at 10 pm. 15 mL 5  . levothyroxine (SYNTHROID, LEVOTHROID) 50 MCG tablet Take 1 tablet (50 mcg total) by mouth daily before breakfast. 30 tablet 4  . Multiple Vitamin (MULTIVITAMIN WITH MINERALS) TABS tablet Take 1 tablet by mouth daily. 30 tablet 0  . ondansetron (ZOFRAN) 4 MG tablet Take 1 tablet (4 mg total) by mouth every 8 (eight) hours as needed for nausea or vomiting. 30 tablet 1  . potassium chloride SA (K-DUR,KLOR-CON) 20 MEQ tablet Take 2 tablets (40 mEq total) by mouth daily. 10 tablet 0  . rivaroxaban (XARELTO) 20 MG TABS tablet Take 1 tablet (20 mg total) by mouth daily with supper. 30 tablet 5  . thiamine 100 MG tablet Take 1 tablet (100 mg total)  by mouth daily. 30 tablet 0  . zolpidem (AMBIEN) 10 MG tablet Take 1 tablet (10 mg total) by mouth at bedtime as needed for sleep. 30 tablet 2   No current facility-administered medications for this visit.     Allergies:   Penicillins; Bacitracin; and Septra [sulfamethoxazole-trimethoprim]   Social History:  The patient  reports that he has been smoking Cigarettes.  He has been smoking about 6.00 packs per day. He has never used smokeless tobacco. He reports that he drinks alcohol. He reports that he uses drugs, including Marijuana.   Family History:  The patient's family history includes Diabetes in his father, mother, and  sister.  ROS:  Please see the history of present illness.  All other systems are reviewed and otherwise negative.   PHYSICAL EXAM: *** VS:  There were no vitals taken for this visit. BMI: There is no height or weight on file to calculate BMI. Well nourished, well developed, in no acute distress  HEENT: normocephalic, atraumatic  Neck: no JVD, carotid bruits or masses Cardiac:  *** RRR; no significant murmurs, no rubs, or gallops Lungs:  *** CTA b/l, no wheezing, rhonchi or rales  Abd: soft, nontender MS: no deformity or *** atrophy Ext: *** no edema  Skin: warm and dry, no rash Neuro:  No gross deficits appreciated Psych: euthymic mood, full affect  *** PPM site is stable, no tethering or discomfort   EKG:  Done today shows *** PPM interrogation done today by industry and reviewed by myself: ***   02/22/16: TEE Study Conclusions - Left ventricle: Systolic function was normal. The estimated   ejection fraction was in the range of 55% to 60%. Wall motion was   normal; there were no regional wall motion abnormalities. - Mitral valve: Mildly calcified annulus. - Left atrium: No evidence of thrombus in the atrial cavity or   appendage. No evidence of thrombus in the atrial cavity or   appendage. - Right ventricle: Pacer wire or catheter noted in right ventricle   with two mobile targets on the RA side of the lead. There is a   0.37 cm x 0.86 cm target superiorly and a 2.2 cm x 0.39 cm lesion   closer to the valve. - Right atrium: Pacer wire or catheter noted in right atrium. No   evidence of thrombus in the atrial cavity or appendage. - Atrial septum: No defect or patent foramen ovale was identified   by color flow Doppler. Impressions - There is a 2.2 cm x 0.4 cm mobile target on the RV pacemaker lead   as well as a 0.4 x 0.9cm target supereiorly on the same lead. The   RA lead is unaffected. The differential includes thrombus, fibrin   sheath and vegetation.   Recent  Labs: 10/18/2016: Magnesium 1.5 11/04/2016: TSH 4.18 02/15/2017: Hemoglobin 14.5; Platelets 144 02/20/2017: ALT 31; BUN 5; Creat 0.94; Potassium 3.2; Sodium 129  No results found for requested labs within last 8760 hours.   CrCl cannot be calculated (Patient's most recent lab result is older than the maximum 21 days allowed.).   Wt Readings from Last 3 Encounters:  03/24/17 182 lb (82.6 kg)  02/20/17 179 lb (81.2 kg)  02/15/17 188 lb (85.3 kg)     Other studies reviewed: Additional studies/records reviewed today include: summarized above  ASSESSMENT AND PLAN:  1. PPM (SSSx)     ***  2. HTN     ***  3.    Disposition: F/u with ***  Current medicines are reviewed at length with the patient today.  The patient did not have any concerns regarding medicines.***  Judith BlonderSigned, Camrin Gearheart Ursy, PA-C 08/02/2017 9:46 AM     [facility] [address] [place] [address] [phone] (office)  [phone] (fax)

## 2017-08-03 ENCOUNTER — Encounter: Payer: Self-pay | Admitting: Physician Assistant

## 2017-08-04 ENCOUNTER — Encounter: Payer: Self-pay | Admitting: Physician Assistant

## 2017-08-19 ENCOUNTER — Encounter: Payer: Self-pay | Admitting: Physician Assistant

## 2017-08-19 NOTE — Progress Notes (Deleted)
Cardiology Office Note Date:  08/19/2017  Patient ID:  Patrick Small, DOB [birth year], MRN [id number] PCP:  Dessa Phi, MD  Cardiologist:  Dr. Elberta Fortis  ***refresh   Chief Complaint: annual visit  History of Present Illness: Patrick Small is a 56 y.o. 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  TEE demonstrated vegetation vs thrombin. Without symptoms of infection, device extraction was not recommended. Blood cultures were negative early 2017.  He comes in today to be seen for Dr. Elberta Fortis, last saw A. Glory Buff last year in May, no changes were made to his tx.  *** symptoms ** bleeding/xarelto *** labs? lipids *** smoking? *** remotes going to [facility] cardiology? Brooke Pace, MD 07/28/17 Device Findings:  Please see downloaded PDF file of transmission under Media Tab for full details of device interrogation to include, when applicable, battery status/charge time, lead trend data, and programmed parameters.   Battery Status Adequate Battery Voltage  Lead Trends Lead Trends Stable  9 episodes of NSVT, egms show short runs of 1:1 SVT, PACs, and PVCs, < 6 seconds  Device History: BSX dual chamber PPM [place]  Past Medical History:  Diagnosis Date  . Acid reflux   . Acne rosacea   . Diabetes mellitus without complication (HCC) Dx 2010  . DVT (deep venous thrombosis) (HCC) 2012 and 2013    Left and Right leg, no history of PE    . Hyperlipidemia Dx 2010  . Hypertension Dx 2010  . Mood disorder (HCC)   . Plantar fasciitis   . Substance abuse    last alchohol intake 01/24/2013  . Thyroid disease     Past Surgical History:  Procedure Laterality Date  . PACEMAKER INSERTION  2014  . TEE WITHOUT CARDIOVERSION N/A 02/22/2016   Procedure: TRANSESOPHAGEAL ECHOCARDIOGRAM (TEE);  Surgeon: Chilton Si, MD;  Location: [facility] ENDOSCOPY;   Service: Cardiovascular;  Laterality: N/A;    Current Outpatient Prescriptions  Medication Sig Dispense Refill  . amLODipine (NORVASC) 10 MG tablet Take 1 tablet (10 mg total) by mouth daily. 30 tablet 11  . cyclobenzaprine (FLEXERIL) 10 MG tablet Take 1 tablet (10 mg total) by mouth 3 (three) times daily as needed for muscle spasms. 30 tablet 0  . exenatide (BYETTA) 10 MCG/0.04ML SOPN injection Inject 0.04 mLs (10 mcg total) into the skin 2 (two) times daily with a meal. 2.4 mL 5  . folic acid (FOLVITE) 1 MG tablet Take 1 tablet (1 mg total) by mouth daily. 30 tablet 11  . gabapentin (NEURONTIN) 300 MG capsule Take 300 mg in the morning and 600 mg before bed 90 capsule 3  . glucose blood (ACCU-CHEK AVIVA) test strip Use as instructed 3 times daily 100 each 12  . glucose blood (TRUE METRIX BLOOD GLUCOSE TEST) test strip Use as instructed 100 each 12  . insulin aspart (NOVOLOG) 100 UNIT/ML injection Inject 30 Units into the skin 3 (three) times daily with meals. 10 mL 5  . Insulin Glargine (LANTUS SOLOSTAR) 100 UNIT/ML Solostar Pen Inject 45 Units into the skin daily at 10 pm. 15 mL 5  . levothyroxine (SYNTHROID, LEVOTHROID) 50 MCG tablet Take 1 tablet (50 mcg total) by mouth daily before breakfast. 30 tablet 4  . Multiple Vitamin (MULTIVITAMIN WITH MINERALS) TABS tablet Take 1 tablet by mouth daily. 30 tablet 0  . ondansetron (ZOFRAN) 4 MG tablet Take 1  tablet (4 mg total) by mouth every 8 (eight) hours as needed for nausea or vomiting. 30 tablet 1  . potassium chloride SA (K-DUR,KLOR-CON) 20 MEQ tablet Take 2 tablets (40 mEq total) by mouth daily. 10 tablet 0  . rivaroxaban (XARELTO) 20 MG TABS tablet Take 1 tablet (20 mg total) by mouth daily with supper. 30 tablet 5  . thiamine 100 MG tablet Take 1 tablet (100 mg total) by mouth daily. 30 tablet 0  . zolpidem (AMBIEN) 10 MG tablet Take 1 tablet (10 mg total) by mouth at bedtime as needed for sleep. 30 tablet 2   No current  facility-administered medications for this visit.     Allergies:   Penicillins; Bacitracin; and Septra [sulfamethoxazole-trimethoprim]   Social History:  The patient  reports that he has been smoking Cigarettes.  He has been smoking about 6.00 packs per day. He has never used smokeless tobacco. He reports that he drinks alcohol. He reports that he uses drugs, including Marijuana.   Family History:  The patient's family history includes Diabetes in his father, mother, and sister.  ROS:  Please see the history of present illness.  All other systems are reviewed and otherwise negative.   PHYSICAL EXAM: *** VS:  There were no vitals taken for this visit. BMI: There is no height or weight on file to calculate BMI. Well nourished, well developed, in no acute distress  HEENT: normocephalic, atraumatic  Neck: no JVD, carotid bruits or masses Cardiac:  *** RRR; no significant murmurs, no rubs, or gallops Lungs:  *** CTA b/l, no wheezing, rhonchi or rales  Abd: soft, nontender MS: no deformity or *** atrophy Ext: *** no edema  Skin: warm and dry, no rash Neuro:  No gross deficits appreciated Psych: euthymic mood, full affect  *** PPM site is stable, no tethering or discomfort   EKG:  Done today shows *** PPM interrogation done today by industry and reviewed by myself: ***   02/22/16: TEE Study Conclusions - Left ventricle: Systolic function was normal. The estimated   ejection fraction was in the range of 55% to 60%. Wall motion was   normal; there were no regional wall motion abnormalities. - Mitral valve: Mildly calcified annulus. - Left atrium: No evidence of thrombus in the atrial cavity or   appendage. No evidence of thrombus in the atrial cavity or   appendage. - Right ventricle: Pacer wire or catheter noted in right ventricle   with two mobile targets on the RA side of the lead. There is a   0.37 cm x 0.86 cm target superiorly and a 2.2 cm x 0.39 cm lesion   closer to the  valve. - Right atrium: Pacer wire or catheter noted in right atrium. No   evidence of thrombus in the atrial cavity or appendage. - Atrial septum: No defect or patent foramen ovale was identified   by color flow Doppler. Impressions - There is a 2.2 cm x 0.4 cm mobile target on the RV pacemaker lead   as well as a 0.4 x 0.9cm target supereiorly on the same lead. The   RA lead is unaffected. The differential includes thrombus, fibrin   sheath and vegetation.   Recent Labs: 10/18/2016: Magnesium 1.5 11/04/2016: TSH 4.18 02/15/2017: Hemoglobin 14.5; Platelets 144 02/20/2017: ALT 31; BUN 5; Creat 0.94; Potassium 3.2; Sodium 129  No results found for requested labs within last 8760 hours.   CrCl cannot be calculated (Patient's most recent lab result is older than  the maximum 21 days allowed.).   Wt Readings from Last 3 Encounters:  03/24/17 182 lb (82.6 kg)  02/20/17 179 lb (81.2 kg)  02/15/17 188 lb (85.3 kg)     Other studies reviewed: Additional studies/records reviewed today include: summarized above  ASSESSMENT AND PLAN:  1. PPM (SSSx)     ***  2. HTN     ***  3.    Disposition: F/u with ***  Current medicines are reviewed at length with the patient today.  The patient did not have any concerns regarding medicines.***  Signed, Sherrilee Gilles, PA-C 08/19/2017 5:15 AM     [facility] [address] [place] [address] [phone] (office)  [phone] (fax)

## 2017-08-20 ENCOUNTER — Encounter: Payer: Self-pay | Admitting: Physician Assistant

## 2017-08-26 IMAGING — CR DG CHEST 2V
2 series · 2 of 2 positions shown · non-contrast
Comparison: 10/14/2016

CLINICAL DATA: Worsening chest pain

EXAM:
CHEST  2 VIEW

[w chest pa]
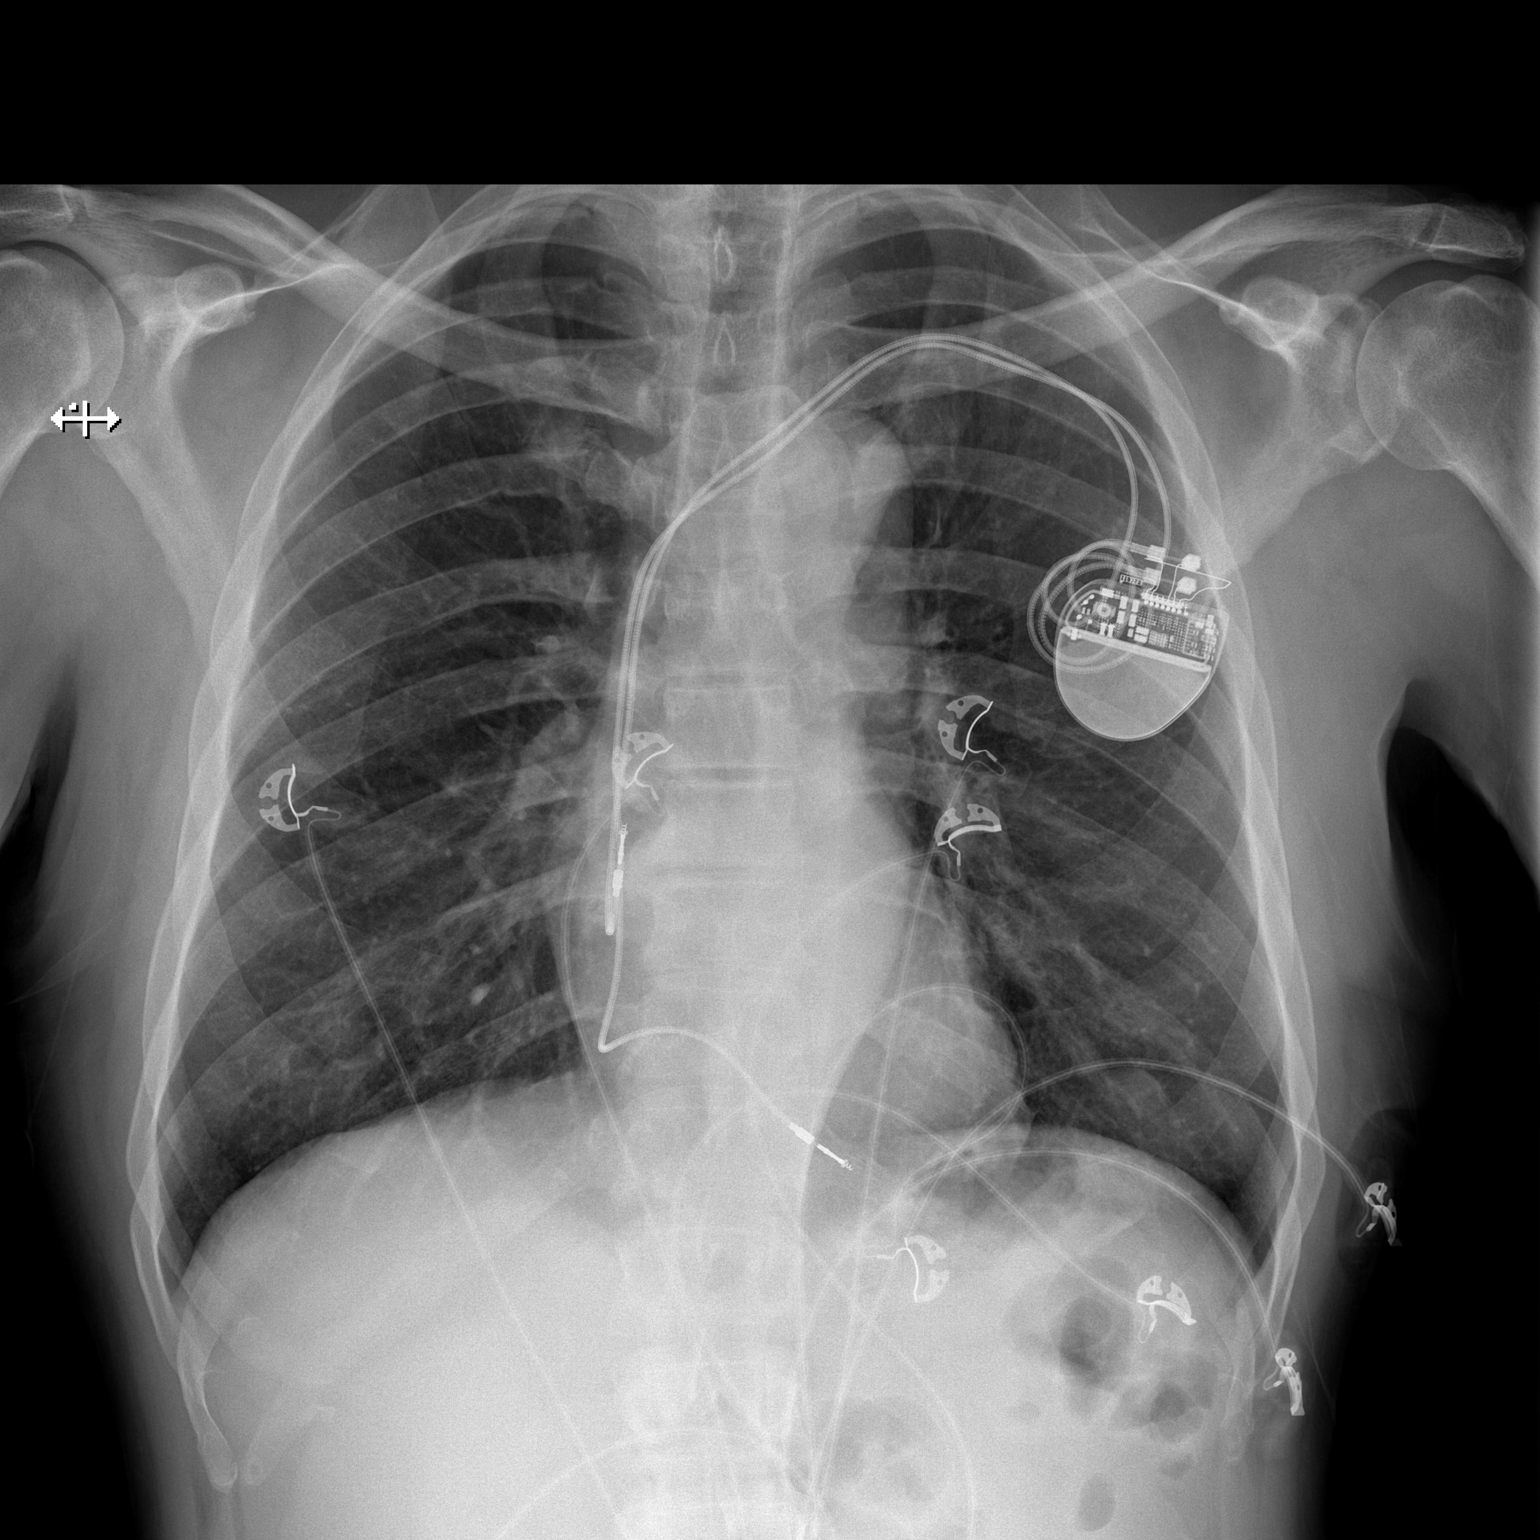

[w chest lat]
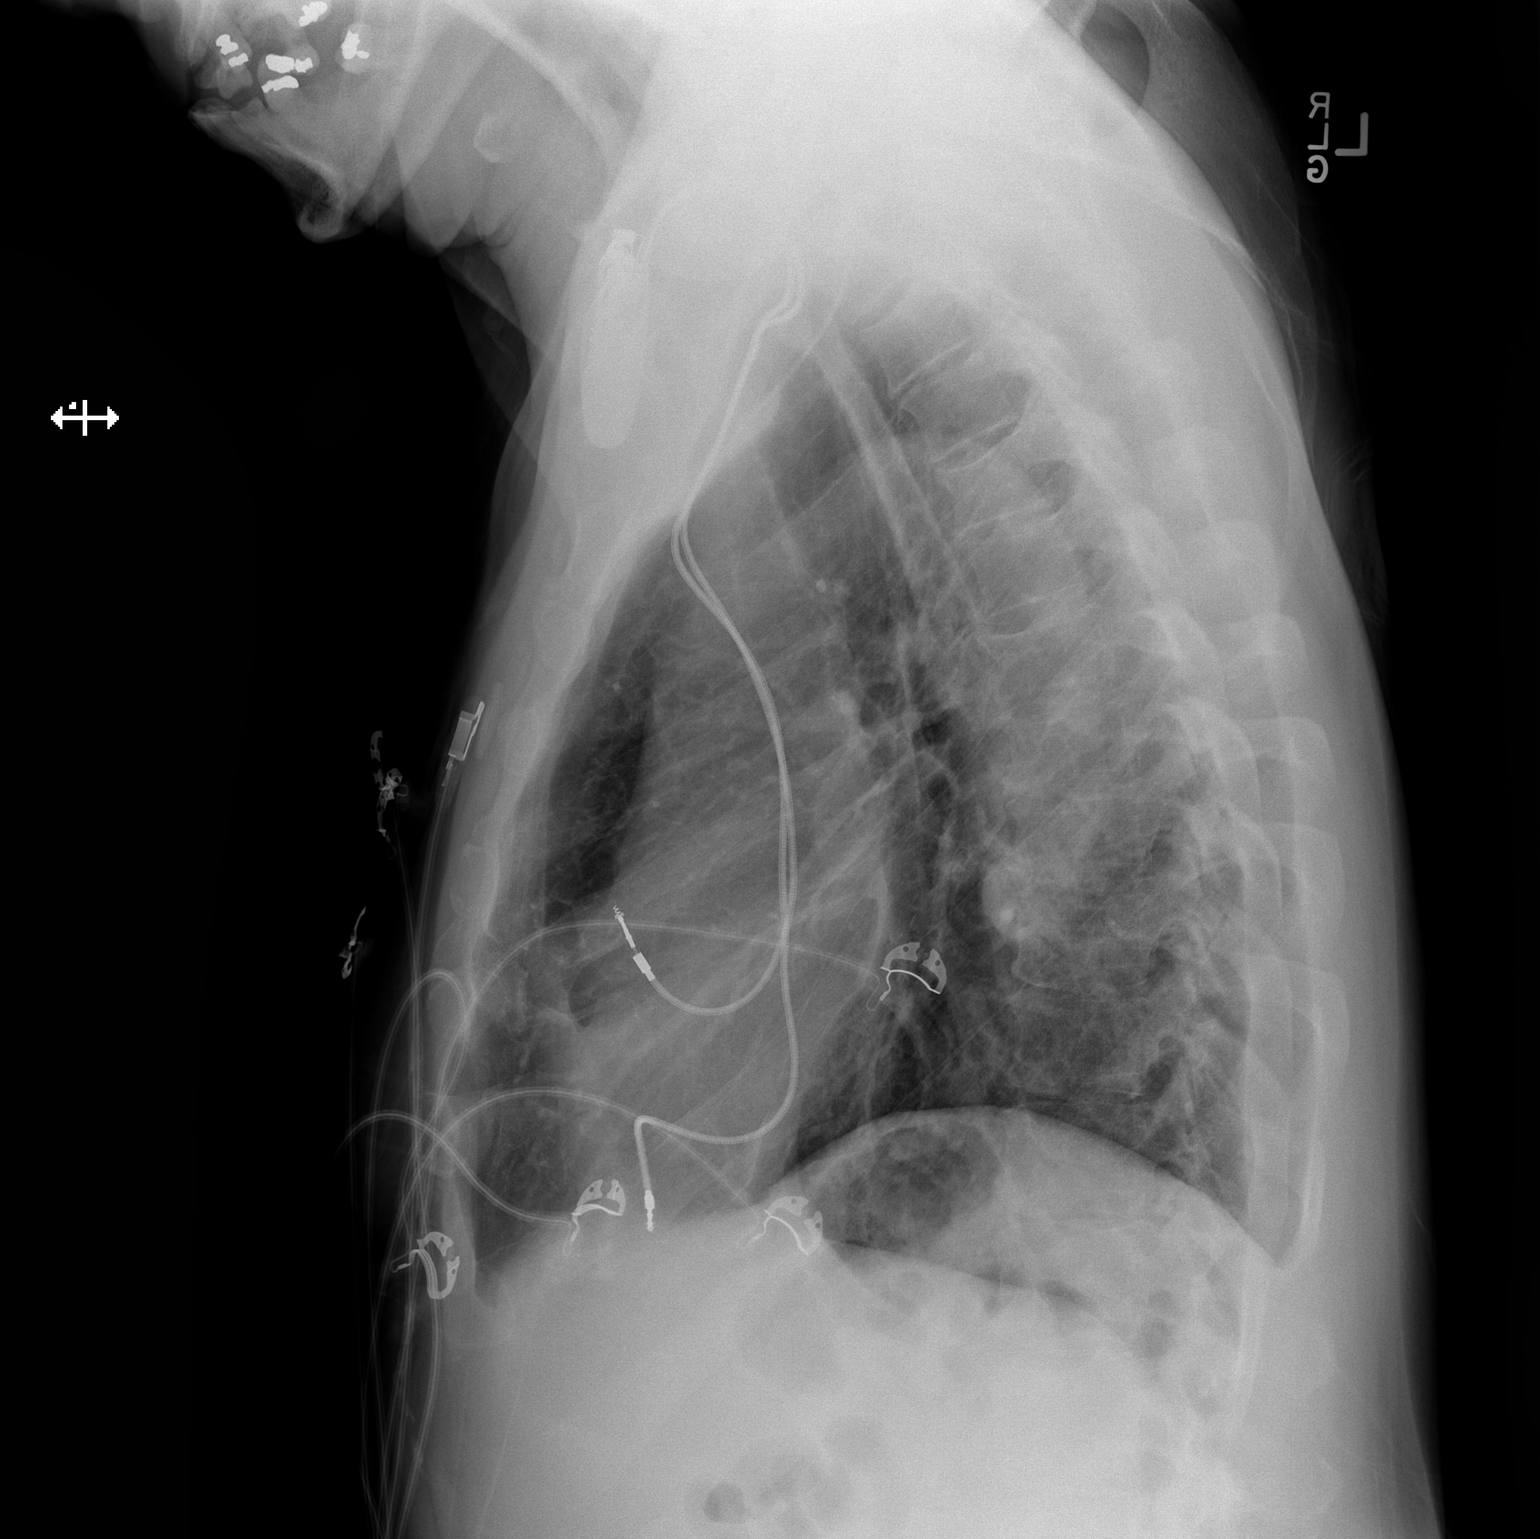

[2 of 2 positions shown; findings below may reference images not displayed]

FINDINGS: Cardiac shadow is stable. Pacing device is noted and stable in
appearance. The lungs are well aerated bilaterally. No focal
infiltrate or sizable effusion is seen. No bony abnormality is
noted.
IMPRESSION: No active cardiopulmonary disease.

## 2017-10-27 ENCOUNTER — Ambulatory Visit: Payer: Self-pay

## 2017-11-04 ENCOUNTER — Ambulatory Visit: Payer: Medicaid Other | Attending: Internal Medicine | Admitting: Physician Assistant

## 2017-11-04 ENCOUNTER — Other Ambulatory Visit: Payer: Self-pay | Admitting: Physician Assistant

## 2017-11-04 ENCOUNTER — Ambulatory Visit (HOSPITAL_COMMUNITY)
Admission: RE | Admit: 2017-11-04 | Discharge: 2017-11-04 | Disposition: A | Discharge status: Home / Self Care | Payer: Medicaid Other | Source: Ambulatory Visit | Attending: Physician Assistant | Admitting: Physician Assistant

## 2017-11-04 VITALS — BP 135/84 | HR 89 | Temp 98.5°F | Resp 18 | Ht 73.0 in | Wt 162.0 lb

## 2017-11-04 DIAGNOSIS — E1165 Type 2 diabetes mellitus with hyperglycemia: Secondary | ICD-10-CM | POA: Insufficient documentation

## 2017-11-04 DIAGNOSIS — S62612A Displaced fracture of proximal phalanx of right middle finger, initial encounter for closed fracture: Secondary | ICD-10-CM | POA: Insufficient documentation

## 2017-11-04 DIAGNOSIS — I10 Essential (primary) hypertension: Secondary | ICD-10-CM | POA: Insufficient documentation

## 2017-11-04 DIAGNOSIS — Z880 Allergy status to penicillin: Secondary | ICD-10-CM | POA: Insufficient documentation

## 2017-11-04 DIAGNOSIS — Z79899 Other long term (current) drug therapy: Secondary | ICD-10-CM | POA: Diagnosis not present

## 2017-11-04 DIAGNOSIS — M79641 Pain in right hand: Secondary | ICD-10-CM | POA: Diagnosis present

## 2017-11-04 DIAGNOSIS — Z882 Allergy status to sulfonamides status: Secondary | ICD-10-CM | POA: Diagnosis not present

## 2017-11-04 DIAGNOSIS — E785 Hyperlipidemia, unspecified: Secondary | ICD-10-CM | POA: Diagnosis not present

## 2017-11-04 DIAGNOSIS — L719 Rosacea, unspecified: Secondary | ICD-10-CM | POA: Diagnosis not present

## 2017-11-04 DIAGNOSIS — Z86718 Personal history of other venous thrombosis and embolism: Secondary | ICD-10-CM | POA: Diagnosis not present

## 2017-11-04 DIAGNOSIS — M7989 Other specified soft tissue disorders: Secondary | ICD-10-CM | POA: Insufficient documentation

## 2017-11-04 DIAGNOSIS — M25561 Pain in right knee: Secondary | ICD-10-CM

## 2017-11-04 DIAGNOSIS — M25562 Pain in left knee: Secondary | ICD-10-CM | POA: Insufficient documentation

## 2017-11-04 DIAGNOSIS — Z7901 Long term (current) use of anticoagulants: Secondary | ICD-10-CM | POA: Insufficient documentation

## 2017-11-04 DIAGNOSIS — E079 Disorder of thyroid, unspecified: Secondary | ICD-10-CM | POA: Diagnosis not present

## 2017-11-04 DIAGNOSIS — K219 Gastro-esophageal reflux disease without esophagitis: Secondary | ICD-10-CM | POA: Diagnosis not present

## 2017-11-04 DIAGNOSIS — Z794 Long term (current) use of insulin: Secondary | ICD-10-CM | POA: Insufficient documentation

## 2017-11-04 DIAGNOSIS — X58XXXA Exposure to other specified factors, initial encounter: Secondary | ICD-10-CM | POA: Diagnosis not present

## 2017-11-04 LAB — GLUCOSE, POCT (MANUAL RESULT ENTRY): POC Glucose: 146 mg/dl — AB (ref 70–99)

## 2017-11-04 LAB — POCT GLYCOSYLATED HEMOGLOBIN (HGB A1C): Hemoglobin A1C: 6.8

## 2017-11-04 MED ORDER — MELOXICAM 15 MG PO TABS: 15.0000 mg | ORAL_TABLET | Freq: Every day | ORAL | 0 refills | Status: DC

## 2017-11-04 MED FILL — MELOXICAM 15 MG TABLET: 15 | 30 days supply | Qty: 30 | Fill #0

## 2017-11-04 NOTE — Progress Notes (Signed)
68 

## 2017-11-04 NOTE — Patient Instructions (Signed)
Check blood sugar fasting and at bedtime 
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
## 2017-11-05 ENCOUNTER — Other Ambulatory Visit: Payer: Self-pay | Admitting: Physician Assistant

## 2017-11-05 DIAGNOSIS — S62302A Unspecified fracture of third metacarpal bone, right hand, initial encounter for closed fracture: Secondary | ICD-10-CM

## 2017-11-05 DIAGNOSIS — R748 Abnormal levels of other serum enzymes: Secondary | ICD-10-CM

## 2017-11-05 DIAGNOSIS — R945 Abnormal results of liver function studies: Principal | ICD-10-CM

## 2017-11-05 DIAGNOSIS — R7989 Other specified abnormal findings of blood chemistry: Secondary | ICD-10-CM

## 2017-11-05 LAB — SEDIMENTATION RATE: Sed Rate: 32 mm/hr — ABNORMAL HIGH (ref 0–30)

## 2017-11-05 LAB — CBC WITH DIFFERENTIAL/PLATELET
BASOS: 2 %
Basophils Absolute: 0.1 10*3/uL (ref 0.0–0.2)
EOS (ABSOLUTE): 0 10*3/uL (ref 0.0–0.4)
EOS: 0 %
HEMATOCRIT: 40.1 % (ref 37.5–51.0)
Hemoglobin: 13.7 g/dL (ref 13.0–17.7)
IMMATURE GRANS (ABS): 0 10*3/uL (ref 0.0–0.1)
IMMATURE GRANULOCYTES: 0 %
LYMPHS: 49 %
Lymphocytes Absolute: 2.1 10*3/uL (ref 0.7–3.1)
MCH: 34.2 pg — ABNORMAL HIGH (ref 26.6–33.0)
MCHC: 34.2 g/dL (ref 31.5–35.7)
MCV: 100 fL — AB (ref 79–97)
MONOCYTES: 8 %
Monocytes Absolute: 0.4 10*3/uL (ref 0.1–0.9)
NEUTROS PCT: 41 %
Neutrophils Absolute: 1.7 10*3/uL (ref 1.4–7.0)
Platelets: 149 10*3/uL — ABNORMAL LOW (ref 150–379)
RBC: 4.01 x10E6/uL — ABNORMAL LOW (ref 4.14–5.80)
RDW: 14.3 % (ref 12.3–15.4)
WBC: 4.2 10*3/uL (ref 3.4–10.8)

## 2017-11-05 LAB — COMPREHENSIVE METABOLIC PANEL
A/G RATIO: 1.4 (ref 1.2–2.2)
ALT: 71 IU/L — AB (ref 0–44)
AST: 126 IU/L — ABNORMAL HIGH (ref 0–40)
Albumin: 4.6 g/dL (ref 3.5–5.5)
Alkaline Phosphatase: 343 IU/L — ABNORMAL HIGH (ref 39–117)
BUN/Creatinine Ratio: 18 (ref 9–20)
BUN: 13 mg/dL (ref 6–24)
Bilirubin Total: 0.9 mg/dL (ref 0.0–1.2)
CALCIUM: 9.4 mg/dL (ref 8.7–10.2)
CO2: 23 mmol/L (ref 20–29)
Chloride: 100 mmol/L (ref 96–106)
Creatinine, Ser: 0.73 mg/dL — ABNORMAL LOW (ref 0.76–1.27)
GFR, EST AFRICAN AMERICAN: 121 mL/min/{1.73_m2} (ref >59)
GFR, EST NON AFRICAN AMERICAN: 104 mL/min/{1.73_m2} (ref >59)
GLUCOSE: 120 mg/dL — AB (ref 65–99)
Globulin, Total: 3.2 g/dL (ref 1.5–4.5)
Potassium: 4.4 mmol/L (ref 3.5–5.2)
Sodium: 142 mmol/L (ref 134–144)
TOTAL PROTEIN: 7.8 g/dL (ref 6.0–8.5)

## 2017-11-09 ENCOUNTER — Telehealth: Payer: Self-pay | Admitting: Family Medicine

## 2017-11-09 NOTE — Telephone Encounter (Signed)
Pt. Called requesting his hand x-ray results that he had done last week. Please f/u with pt.

## 2017-11-09 NOTE — Telephone Encounter (Signed)
Medical Assistant left message on patient's home and cell voicemail. Voicemail states to give a call back to [place] with [facility] at [phone]. [facility] for fracture that was noted on hand xray. Patient was contacted by [facility] on 11/05/17 to schedule an appointment. Patient was advised to return the phone call.

## 2017-11-12 NOTE — Telephone Encounter (Addendum)
Spoke to patient and and informed [facility] to [facility]. He was unable to take the number at the time call was returned. He stated he will call [facility] to get number: Please give [facility] number to [facility]: [phone]

## 2017-11-16 ENCOUNTER — Ambulatory Visit (INDEPENDENT_AMBULATORY_CARE_PROVIDER_SITE_OTHER): Payer: Self-pay | Admitting: Orthopaedic Surgery

## 2017-11-16 ENCOUNTER — Ambulatory Visit (INDEPENDENT_AMBULATORY_CARE_PROVIDER_SITE_OTHER): Payer: Medicaid Other | Admitting: Orthopaedic Surgery

## 2017-11-16 ENCOUNTER — Ambulatory Visit (INDEPENDENT_AMBULATORY_CARE_PROVIDER_SITE_OTHER): Payer: Self-pay

## 2017-11-16 ENCOUNTER — Encounter (INDEPENDENT_AMBULATORY_CARE_PROVIDER_SITE_OTHER): Payer: Self-pay | Admitting: Orthopaedic Surgery

## 2017-11-16 DIAGNOSIS — S62612A Displaced fracture of proximal phalanx of right middle finger, initial encounter for closed fracture: Secondary | ICD-10-CM

## 2017-11-16 NOTE — Progress Notes (Signed)
Office Visit Note   Patient: Patrick BombardDarryl Caldas           Date of Birth: 04/02/1961           MRN: [id number] Visit Date: 11/16/2017              Requested by: Anders SimmondsMcClung, Angela M, PA-C [address] [place], [place] [address] PCP: Dessa PhiFunches, Josalyn, MD   Assessment & Plan: Visit Diagnoses:  1. Closed displaced fracture of proximal phalanx of right middle finger, initial encounter     Plan: Minimally displaced proximal phalanx fracture of the middle finger.  Buddy taped to the ring finger for 4 weeks.  Range of motion as tolerated.  Questions encouraged and answered.  Follow-up as needed.  Follow-Up Instructions: Return if symptoms worsen or fail to improve.   Orders:  No orders of the defined types were placed in this encounter.  No orders of the defined types were placed in this encounter.     Procedures: No procedures performed   Clinical Data: No additional findings.   Subjective: Chief Complaint  Patient presents with  . Right Hand - Pain    Patient is a 56 year old gentleman who comes in with acute right middle finger proximal phalanx fracture from 3 weeks ago.  He states that he is a violent sleeper and may have hit his hand on the headboard.  He complains of pain and swelling.  Denies any numbness and tingling.  Pain does not radiate.    Review of Systems  Constitutional: Negative.   All other systems reviewed and are negative.    Objective: Vital Signs: There were no vitals taken for this visit.  Physical Exam  Constitutional: He is oriented to person, place, and time. He appears well-developed and well-nourished.  HENT:  Head: Normocephalic and atraumatic.  Eyes: Pupils are equal, round, and reactive to light.  Neck: Neck supple.  Pulmonary/Chest: Effort normal.  Abdominal: Soft.  Musculoskeletal: Normal range of motion.  Neurological: He is alert and oriented to person, place, and time.  Skin: Skin is warm.  Psychiatric: He has a normal mood  and affect. His behavior is normal. Judgment and thought content normal.  Nursing note and vitals reviewed.   Ortho Exam Right hand exam shows moderate swelling of the right hand mainly localized over the MCP joint of the middle finger.  Hand is without neurovascular compromise Specialty Comments:  No specialty comments available.  Imaging: No results found.   PMFS History: Patient Active Problem List   Diagnosis Date Noted  . Insomnia 05/21/2017  . Sleep apnea 04/21/2017  . Odynophagia 02/01/2017  . Esophageal dysphagia 02/01/2017  . Pain in joint of left wrist 04/29/2016  . Uncontrolled type 2 diabetes mellitus (HCC) 03/04/2016  . Chronic low back pain 03/04/2016  . Diabetic peripheral neuropathy associated with type 2 diabetes mellitus (HCC) 03/04/2016  . Transaminasemia   . Sinus node dysfunction (HCC)   . Hyponatremia 02/18/2016  . Syncope 02/18/2016  . Elevated lactic acid level 02/18/2016  . Depression 11/16/2014  . Atrial fibrillation (HCC) 10/05/2014  . Anticoagulated on Coumadin 10/05/2014  . High cholesterol 09/26/2014  . Hypertension 09/25/2014  . Personal history of DVT (deep vein thrombosis) 09/25/2014  . Pacemaker 09/25/2014  . Irregular heartbeat 09/25/2014  . Hypothyroidism 09/25/2014   Past Medical History:  Diagnosis Date  . Acid reflux   . Acne rosacea   . Diabetes mellitus without complication (HCC) Dx 2010  . DVT (deep venous thrombosis) (HCC)  2012 and 2013    Left and Right leg, no history of PE    . Hyperlipidemia Dx 2010  . Hypertension Dx 2010  . Mood disorder (HCC)   . Plantar fasciitis   . Substance abuse (HCC)    last alchohol intake 01/24/2013  . Thyroid disease     Family History  Problem Relation Age of Onset  . Diabetes Mother   . Diabetes Father   . Diabetes Sister     Past Surgical History:  Procedure Laterality Date  . PACEMAKER INSERTION  2014  . TRANSESOPHAGEAL ECHOCARDIOGRAM (TEE) N/A 02/22/2016   Performed by  Chilton Siandolph, Tiffany, MD at [facility]   Social History   Occupational History  . Not on file  Tobacco Use  . Smoking status: Current Every Day Smoker    Packs/day: 6.00    Types: Cigarettes  . Smokeless tobacco: Never Used  Substance and Sexual Activity  . Alcohol use: Yes    Alcohol/week: 0.0 oz    Comment: only on weekends  . Drug use: Yes    Types: Marijuana    Comment: last used last month about  03/30/2016  . Sexual activity: Not on file

## 2017-11-25 ENCOUNTER — Other Ambulatory Visit: Payer: Self-pay | Admitting: Pharmacist

## 2017-11-25 DIAGNOSIS — I10 Essential (primary) hypertension: Secondary | ICD-10-CM

## 2017-11-25 MED ORDER — AMLODIPINE BESYLATE 10 MG PO TABS: 10.0000 mg | ORAL_TABLET | Freq: Every day | ORAL | 1 refills | Status: DC

## 2017-12-03 ENCOUNTER — Other Ambulatory Visit: Payer: Self-pay | Admitting: Internal Medicine

## 2017-12-03 ENCOUNTER — Telehealth (INDEPENDENT_AMBULATORY_CARE_PROVIDER_SITE_OTHER): Payer: Self-pay | Admitting: Orthopaedic Surgery

## 2017-12-03 ENCOUNTER — Telehealth: Payer: Self-pay | Admitting: Family Medicine

## 2017-12-03 ENCOUNTER — Telehealth (INDEPENDENT_AMBULATORY_CARE_PROVIDER_SITE_OTHER): Payer: Self-pay

## 2017-12-03 DIAGNOSIS — S62642A Nondisplaced fracture of proximal phalanx of right middle finger, initial encounter for closed fracture: Secondary | ICD-10-CM

## 2017-12-03 NOTE — Telephone Encounter (Signed)
Patient called back.   Per lauren   "Patient called with very beligerent tone stating how bad his finger was hurting. He wanted to know why no one had called him back. Advised that Dr Roda ShuttersXu and his assistant were in clinic this morning and did aplogize for the delay. I let him know what Dr Roda ShuttersXu had stated in previous message. I tried to explain to him but he would not let me talk. Said that he was going to drink liquor for his pain. He proceeded to use very rude and inappropriate profanity. I did try to talk to patient about his issue but once he stated calling me inappropriate names I hung up on him because I will not be talked to in that manner. "

## 2017-12-03 NOTE — Progress Notes (Signed)
Patient has been referred to Orthopedist for the second time.

## 2017-12-03 NOTE — Telephone Encounter (Signed)
Patient called stating that he was told that he needed a cast at [facility] and went to to the hospital for the cast and the doctor  at the hospital told him he didn't need a cast. I offered the patient patient to come in and see the doctor at 2:00. He asked if we could put the cast on here. I informed him that we do not put cast on at our clinic. He refused the appointment because we do not put cast on. I then in formed [place] what was going on he became very loud with her. He was very upset so I went to find a nurse and the doctor and who he saw at the last visit.  no one was available. Patient then stated he was going to call an [employer].

## 2017-12-03 NOTE — Telephone Encounter (Signed)
Pt called and he is in a lot pain. Pt had Right hand middle finger and pt would like a cast for finger. Please call pt to help him he is very upset.

## 2017-12-03 NOTE — Telephone Encounter (Signed)
See other message

## 2017-12-03 NOTE — Telephone Encounter (Signed)
See message below °

## 2017-12-03 NOTE — Telephone Encounter (Signed)
Patient called with very beligerent tone stating how bad his finger was hurting. He wanted to know why no one had called him back. Advised that Dr Roda ShuttersXu and his assistant were in clinic this morning and did aplogize for the delay. I let him know what Dr Roda ShuttersXu had stated in previous message. I tried to explain to him but he would not let me talk. Said that he was going to drink liquor for his pain. He proceeded to use very rude and inappropriate profanity. I did try to talk to patient about his issue but once he stated calling me inappropriate names I hung up on him because I will not be talked to in that manner.

## 2017-12-03 NOTE — Telephone Encounter (Signed)
Patient does not need a cast and would actually be bad for him.  This will cause him weakness and stiffness unnecessarily.  If he is in a lot of pain that is probably doing too much with the hand.

## 2017-12-03 NOTE — Telephone Encounter (Signed)
Patient has been referred to Orthopedist for the second time.  

## 2017-12-08 ENCOUNTER — Ambulatory Visit (INDEPENDENT_AMBULATORY_CARE_PROVIDER_SITE_OTHER): Payer: Medicaid Other | Admitting: Orthopaedic Surgery

## 2017-12-14 ENCOUNTER — Ambulatory Visit (INDEPENDENT_AMBULATORY_CARE_PROVIDER_SITE_OTHER): Payer: Medicaid Other | Admitting: Orthopaedic Surgery

## 2017-12-15 ENCOUNTER — Ambulatory Visit (INDEPENDENT_AMBULATORY_CARE_PROVIDER_SITE_OTHER): Payer: Medicaid Other | Admitting: Orthopaedic Surgery

## 2017-12-21 ENCOUNTER — Other Ambulatory Visit: Payer: Self-pay | Admitting: Physician Assistant

## 2017-12-21 DIAGNOSIS — I10 Essential (primary) hypertension: Secondary | ICD-10-CM

## 2017-12-28 ENCOUNTER — Ambulatory Visit (INDEPENDENT_AMBULATORY_CARE_PROVIDER_SITE_OTHER): Payer: Medicaid Other | Admitting: Orthopaedic Surgery

## 2017-12-30 ENCOUNTER — Encounter (INDEPENDENT_AMBULATORY_CARE_PROVIDER_SITE_OTHER): Payer: Self-pay | Admitting: Radiology

## 2018-01-07 ENCOUNTER — Other Ambulatory Visit: Payer: Self-pay | Admitting: Internal Medicine

## 2018-01-08 ENCOUNTER — Ambulatory Visit: Payer: Self-pay | Admitting: Internal Medicine

## 2018-01-13 ENCOUNTER — Ambulatory Visit: Payer: Self-pay

## 2018-01-14 ENCOUNTER — Encounter: Payer: Self-pay | Admitting: Internal Medicine

## 2018-01-14 ENCOUNTER — Ambulatory Visit: Payer: Medicaid Other | Attending: Internal Medicine | Admitting: Internal Medicine

## 2018-01-14 VITALS — BP 177/112 | HR 60 | Temp 97.9°F | Resp 16 | Wt 168.2 lb

## 2018-01-14 DIAGNOSIS — R1310 Dysphagia, unspecified: Secondary | ICD-10-CM

## 2018-01-14 DIAGNOSIS — I4891 Unspecified atrial fibrillation: Secondary | ICD-10-CM | POA: Diagnosis not present

## 2018-01-14 DIAGNOSIS — R945 Abnormal results of liver function studies: Secondary | ICD-10-CM | POA: Diagnosis not present

## 2018-01-14 DIAGNOSIS — F17210 Nicotine dependence, cigarettes, uncomplicated: Secondary | ICD-10-CM | POA: Insufficient documentation

## 2018-01-14 DIAGNOSIS — I10 Essential (primary) hypertension: Secondary | ICD-10-CM | POA: Diagnosis not present

## 2018-01-14 DIAGNOSIS — L309 Dermatitis, unspecified: Secondary | ICD-10-CM

## 2018-01-14 DIAGNOSIS — M25532 Pain in left wrist: Secondary | ICD-10-CM | POA: Diagnosis not present

## 2018-01-14 DIAGNOSIS — R634 Abnormal weight loss: Secondary | ICD-10-CM

## 2018-01-14 DIAGNOSIS — Z79899 Other long term (current) drug therapy: Secondary | ICD-10-CM | POA: Diagnosis not present

## 2018-01-14 DIAGNOSIS — Z7901 Long term (current) use of anticoagulants: Secondary | ICD-10-CM | POA: Diagnosis not present

## 2018-01-14 DIAGNOSIS — M25561 Pain in right knee: Secondary | ICD-10-CM

## 2018-01-14 DIAGNOSIS — R7989 Other specified abnormal findings of blood chemistry: Secondary | ICD-10-CM

## 2018-01-14 DIAGNOSIS — M545 Low back pain, unspecified: Secondary | ICD-10-CM

## 2018-01-14 DIAGNOSIS — G4700 Insomnia, unspecified: Secondary | ICD-10-CM | POA: Insufficient documentation

## 2018-01-14 DIAGNOSIS — E1165 Type 2 diabetes mellitus with hyperglycemia: Secondary | ICD-10-CM | POA: Insufficient documentation

## 2018-01-14 DIAGNOSIS — E7800 Pure hypercholesterolemia, unspecified: Secondary | ICD-10-CM | POA: Diagnosis not present

## 2018-01-14 DIAGNOSIS — Z794 Long term (current) use of insulin: Secondary | ICD-10-CM | POA: Insufficient documentation

## 2018-01-14 DIAGNOSIS — R55 Syncope and collapse: Secondary | ICD-10-CM | POA: Insufficient documentation

## 2018-01-14 DIAGNOSIS — F1019 Alcohol abuse with unspecified alcohol-induced disorder: Secondary | ICD-10-CM | POA: Insufficient documentation

## 2018-01-14 DIAGNOSIS — F329 Major depressive disorder, single episode, unspecified: Secondary | ICD-10-CM | POA: Diagnosis not present

## 2018-01-14 DIAGNOSIS — G4730 Sleep apnea, unspecified: Secondary | ICD-10-CM | POA: Diagnosis not present

## 2018-01-14 DIAGNOSIS — G8929 Other chronic pain: Secondary | ICD-10-CM | POA: Diagnosis not present

## 2018-01-14 DIAGNOSIS — Z86718 Personal history of other venous thrombosis and embolism: Secondary | ICD-10-CM | POA: Insufficient documentation

## 2018-01-14 DIAGNOSIS — M25562 Pain in left knee: Secondary | ICD-10-CM | POA: Diagnosis not present

## 2018-01-14 DIAGNOSIS — E1142 Type 2 diabetes mellitus with diabetic polyneuropathy: Secondary | ICD-10-CM

## 2018-01-14 DIAGNOSIS — Z950 Presence of cardiac pacemaker: Secondary | ICD-10-CM | POA: Diagnosis not present

## 2018-01-14 DIAGNOSIS — E871 Hypo-osmolality and hyponatremia: Secondary | ICD-10-CM | POA: Insufficient documentation

## 2018-01-14 DIAGNOSIS — E039 Hypothyroidism, unspecified: Secondary | ICD-10-CM

## 2018-01-14 DIAGNOSIS — F1010 Alcohol abuse, uncomplicated: Secondary | ICD-10-CM

## 2018-01-14 DIAGNOSIS — E119 Type 2 diabetes mellitus without complications: Secondary | ICD-10-CM | POA: Diagnosis present

## 2018-01-14 LAB — GLUCOSE, POCT (MANUAL RESULT ENTRY): POC Glucose: 190 mg/dl — AB (ref 70–99)

## 2018-01-14 MED ORDER — TRIAMCINOLONE ACETONIDE 0.1 % EX CREA: 1.0000 "application " | TOPICAL_CREAM | Freq: Two times a day (BID) | CUTANEOUS | 1 refills | Status: DC

## 2018-01-14 MED ORDER — INSULIN GLARGINE 100 UNIT/ML SOLOSTAR PEN: 15.0000 [IU] | PEN_INJECTOR | Freq: Every day | SUBCUTANEOUS | 5 refills | Status: DC

## 2018-01-14 MED ORDER — AMLODIPINE BESYLATE 10 MG PO TABS: 10.0000 mg | ORAL_TABLET | Freq: Every day | ORAL | 1 refills | Status: DC

## 2018-01-14 MED ORDER — MELOXICAM 15 MG PO TABS: 15.0000 mg | ORAL_TABLET | Freq: Every day | ORAL | 0 refills | Status: DC

## 2018-01-14 MED ORDER — LEVOTHYROXINE SODIUM 50 MCG PO TABS: 50.0000 ug | ORAL_TABLET | Freq: Every day | ORAL | 4 refills | Status: DC

## 2018-01-14 MED ORDER — GABAPENTIN 300 MG PO CAPS: 300.0000 mg | ORAL_CAPSULE | Freq: Two times a day (BID) | ORAL | 3 refills | Status: DC

## 2018-01-14 MED ORDER — OMEPRAZOLE 20 MG PO CPDR: 20.0000 mg | DELAYED_RELEASE_CAPSULE | Freq: Every day | ORAL | 3 refills | Status: DC

## 2018-01-14 NOTE — Patient Instructions (Signed)

## 2018-01-14 NOTE — Progress Notes (Signed)
Patient ID: Patrick BombardDarryl Small, male    DOB: 03/02/1961  MRN: [id number]  CC: Follow-up (10 week) and bite mark   Subjective: Patrick Small is a 57 y.o. male who presents to est care with me as PCP His concerns today include:  Pt with hx of DM, HTN, hypothyroid, DVT LEs, chronic LBP, insomnia and pace maker  1.  C/o generalized itching x 1 wk.  Living at hotel x 6 mths -thinks due to roach bites.  He sprays the room himself Q wk and manager sprays once a mth -he has not seen bedbugs -no new body products, no new animal/plants exposure -rash on arms and upper back  2.  DM:  -checking BS TID.  Less than 170 Meds: Reports taking only 15 units Lantus daily instead of 45 as indicated on med list, Byetta 10 mg once a day instead of BID and Novolog  20 1-2 x day.  Admits that he does not take any of his meds every day Eating: tries to avoid sugary and greesey foods Walks daily. Does not drive -numbness in hands and feet.  Needs RF on Gabapentin  3.  HTN:  Admits he does not take med every day.  On Amlodipine  4.  Problems swallowing salvia and solids x few mths Some wgh loss which he thinks due to frequent walking and not eating 3 meals a day.  "Depends on how I feel." -not taking thyroid med consistently  5.  AST/ALT elevated on blood test 10/2017. -drinks 40 oz beer about 4-5 x a wk and drinks  two 5th of liquior on weekends -drinks to decrease pain.  States he was getting [place] in [place] but was given hassle when he moved here.  Pain in lower back and knees. No knee x-rays.  Last x-ray lower back 2017: IMPRESSION: 1. Degenerative changes and probable remote mild thoracic compression deformities. 2. Normal alignment and no acute bony findings in the lumbar spine.  Patient Active Problem List   Diagnosis Date Noted  . Insomnia 05/21/2017  . Sleep apnea 04/21/2017  . Odynophagia 02/01/2017  . Esophageal dysphagia 02/01/2017  . Pain in joint of left wrist 04/29/2016  .  Uncontrolled type 2 diabetes mellitus (HCC) 03/04/2016  . Chronic low back pain 03/04/2016  . Diabetic peripheral neuropathy associated with type 2 diabetes mellitus (HCC) 03/04/2016  . Transaminasemia   . Sinus node dysfunction (HCC)   . Hyponatremia 02/18/2016  . Syncope 02/18/2016  . Elevated lactic acid level 02/18/2016  . Depression 11/16/2014  . Atrial fibrillation (HCC) 10/05/2014  . Anticoagulated on Coumadin 10/05/2014  . High cholesterol 09/26/2014  . Hypertension 09/25/2014  . Personal history of DVT (deep vein thrombosis) 09/25/2014  . Pacemaker 09/25/2014  . Irregular heartbeat 09/25/2014  . Hypothyroidism 09/25/2014     Current Outpatient Medications on File Prior to Visit  Medication Sig Dispense Refill  . exenatide (BYETTA) 10 MCG/0.04ML SOPN injection Inject 0.04 mLs (10 mcg total) into the skin 2 (two) times daily with a meal. 2.4 mL 5  . folic acid (FOLVITE) 1 MG tablet Take 1 tablet (1 mg total) by mouth daily. 30 tablet 11  . glucose blood (ACCU-CHEK AVIVA) test strip Use as instructed 3 times daily 100 each 12  . glucose blood (TRUE METRIX BLOOD GLUCOSE TEST) test strip Use as instructed 100 each 12  . insulin aspart (NOVOLOG) 100 UNIT/ML injection Inject 30 Units into the skin 3 (three) times daily with meals. 10 mL 5  .  Multiple Vitamin (MULTIVITAMIN WITH MINERALS) TABS tablet Take 1 tablet by mouth daily. 30 tablet 0  . thiamine 100 MG tablet Take 1 tablet (100 mg total) by mouth daily. 30 tablet 0   No current facility-administered medications on file prior to visit.     Allergies  Allergen Reactions  . Penicillins Hives    Has patient had a PCN reaction causing immediate rash, facial/tongue/throat swelling, SOB or lightheadedness with hypotension: Yes Has patient had a PCN reaction causing severe rash involving mucus membranes or skin necrosis: No Has patient had a PCN reaction that required hospitalization: No Has patient had a PCN reaction occurring  within the last 10 years: No If all of the above answers are "NO", then may proceed with Cephalosporin use.   . Bacitracin Itching, Swelling and Rash  . Septra [Sulfamethoxazole-Trimethoprim] Itching, Swelling and Rash    Social History   Socioeconomic History  . Marital status: Single    Spouse name: Not on file  . Number of children: Not on file  . Years of education: Not on file  . Highest education level: Not on file  Social Needs  . Financial resource strain: Not on file  . Food insecurity - worry: Not on file  . Food insecurity - inability: Not on file  . Transportation needs - medical: Not on file  . Transportation needs - non-medical: Not on file  Occupational History  . Not on file  Tobacco Use  . Smoking status: Current Every Day Smoker    Packs/day: 6.00    Types: Cigarettes  . Smokeless tobacco: Never Used  Substance and Sexual Activity  . Alcohol use: Yes    Alcohol/week: 0.0 oz    Comment: only on weekends  . Drug use: Yes    Types: Marijuana    Comment: last used last month about  03/30/2016  . Sexual activity: Not on file  Other Topics Concern  . Not on file  Social History Narrative   Moved back to [place] after living in [place], [place] 08/2014.    Moved to be closer to his aging mother.   Lived in [place] for 15 years.           Family History  Problem Relation Age of Onset  . Diabetes Mother   . Diabetes Father   . Diabetes Sister     Past Surgical History:  Procedure Laterality Date  . PACEMAKER INSERTION  2014  . TEE WITHOUT CARDIOVERSION N/A 02/22/2016   Procedure: TRANSESOPHAGEAL ECHOCARDIOGRAM (TEE);  Surgeon: Chilton Si, MD;  Location: [facility] ENDOSCOPY;  Service: Cardiovascular;  Laterality: N/A;    ROS: Review of Systems Negative except as stated above PHYSICAL EXAM: BP (!) 177/112   Pulse 60   Temp 97.9 F (36.6 C) (Oral)   Resp 16   Wt 168 lb 3.2 oz (76.3 kg)   SpO2 99%   BMI 22.19 kg/m   Wt Readings from Last 3  Encounters:  01/14/18 168 lb 3.2 oz (76.3 kg)  11/04/17 162 lb (73.5 kg)  03/24/17 182 lb (82.6 kg)    Physical Exam  General appearance - alert, well appearing, middle-age African-American male and in no distress Mental status - alert, oriented to person, place, and time, normal mood, behavior, speech, dress, motor activity, and thought processes Mouth - mucous membranes moist, pharynx normal without lesions Neck - supple, no significant adenopathy Chest - clear to auscultation, no wheezes, rales or rhonchi, symmetric air entry Heart - normal rate, regular rhythm,  normal S1, S2, no murmurs, rubs, clicks or gallops Musculoskeletal -knees: He is wearing a sleeve on the right knee. mild joint enlargement.  No edema.  No point tenderness.  Mild discomfort with passive movement.   Extremities -no lower extremity edema Skin: Mild excoriation on the left upper arm and over the left scapular Results for orders placed or performed in visit on 01/14/18  TSH  Result Value Ref Range   TSH 2.450 0.450 - 4.500 uIU/mL  Hepatitis C Antibody  Result Value Ref Range   Hep C Virus Ab <0.1 0.0 - 0.9 s/co ratio  POCT glucose (manual entry)  Result Value Ref Range   POC Glucose 190 (A) 70 - 99 mg/dl    ASSESSMENT AND PLAN: 1. Diabetic peripheral neuropathy associated with type 2 diabetes mellitus (HCC) Last A1c done in November was at goal.  However I have encourage patient to be compliant with taking his medications.  Lantus dose decreased to reflect what he is taking - POCT glucose (manual entry) - gabapentin (NEURONTIN) 300 MG capsule; Take 1 capsule (300 mg total) by mouth 2 (two) times daily.  Dispense: 60 capsule; Refill: 3 - Insulin Glargine (LANTUS SOLOSTAR) 100 UNIT/ML Solostar Pen; Inject 15 Units into the skin daily at 10 pm.  Dispense: 15 mL; Refill: 5  2. Essential hypertension Not at goal.  Discussed cardiovascular risks associated with uncontrolled blood pressure.  Encourage compliance  with medication - amLODipine (NORVASC) 10 MG tablet; Take 1 tablet (10 mg total) by mouth daily.  Dispense: 30 tablet; Refill: 1  3. Chronic right-sided low back pain without sciatica 4. Chronic pain of both knees - meloxicam (MOBIC) 15 MG tablet; Take 1 tablet (15 mg total) by mouth daily.  Dispense: 30 tablet; Refill: 0  5. Dysphagia, unspecified type 6. Weight loss, unintentional - omeprazole (PRILOSEC) 20 MG capsule; Take 1 capsule (20 mg total) by mouth daily.  Dispense: 30 capsule; Refill: 3 - Ambulatory referral to Gastroenterology -check TSH  7. Alcohol use disorder, mild, abuse 8. Abnormal LFTs -Elevated LFTs in pattern seen with alcohol use.  I went over with patient how much is too much for standard drinks a day for a man.  Encouraged him to cut back. - Hepatitis C Antibody  9. Hypothyroidism, unspecified type - TSH - levothyroxine (SYNTHROID, LEVOTHROID) 50 MCG tablet; Take 1 tablet (50 mcg total) by mouth daily before breakfast.  Dispense: 30 tablet; Refill: 4  10. Dermatitis - triamcinolone cream (KENALOG) 0.1 %; Apply 1 application topically 2 (two) times daily.  Dispense: 80 g; Refill: 1  11.  I inquired from patient whether he is still taking Xarelto and why was he on it.  Patient does not recall history of atrial fibrillation but states that he has had DVT in the legs.  Reports that Dr. Armen Pickup discontinued the Xarelto last year after he was on it for several months. He does not recall why he has a pacemaker. Placed in [place]  Patient was given the opportunity to ask questions.  Patient verbalized understanding of the plan and was able to repeat key elements of the plan.   Orders Placed This Encounter  Procedures  . TSH  . Hepatitis C Antibody  . Ambulatory referral to Gastroenterology  . POCT glucose (manual entry)     Requested Prescriptions   Signed Prescriptions Disp Refills  . omeprazole (PRILOSEC) 20 MG capsule 30 capsule 3    Sig: Take 1 capsule  (20 mg total) by mouth daily.  Marland Kitchen  meloxicam (MOBIC) 15 MG tablet 30 tablet 0    Sig: Take 1 tablet (15 mg total) by mouth daily.  Marland Kitchen gabapentin (NEURONTIN) 300 MG capsule 60 capsule 3    Sig: Take 1 capsule (300 mg total) by mouth 2 (two) times daily.  Marland Kitchen triamcinolone cream (KENALOG) 0.1 % 80 g 1    Sig: Apply 1 application topically 2 (two) times daily.  Marland Kitchen amLODipine (NORVASC) 10 MG tablet 30 tablet 1    Sig: Take 1 tablet (10 mg total) by mouth daily.  Marland Kitchen levothyroxine (SYNTHROID, LEVOTHROID) 50 MCG tablet 30 tablet 4    Sig: Take 1 tablet (50 mcg total) by mouth daily before breakfast.  . Insulin Glargine (LANTUS SOLOSTAR) 100 UNIT/ML Solostar Pen 15 mL 5    Sig: Inject 15 Units into the skin daily at 10 pm.    Return in about 2 months (around 03/14/2018).  Jonah Blue, MD, FACP

## 2018-01-14 NOTE — Progress Notes (Signed)
Pt states something is biting him all over his body at the hotel  Pt states he has been aching a lot lately

## 2018-01-15 DIAGNOSIS — F1010 Alcohol abuse, uncomplicated: Secondary | ICD-10-CM | POA: Insufficient documentation

## 2018-01-15 DIAGNOSIS — R7989 Other specified abnormal findings of blood chemistry: Secondary | ICD-10-CM | POA: Insufficient documentation

## 2018-01-15 DIAGNOSIS — R945 Abnormal results of liver function studies: Secondary | ICD-10-CM | POA: Insufficient documentation

## 2018-01-15 LAB — HEPATITIS C ANTIBODY: Hep C Virus Ab: <0.1 s/co ratio (ref 0.0–0.9)

## 2018-01-15 LAB — TSH: TSH: 2.45 u[IU]/mL (ref 0.450–4.500)

## 2018-01-18 ENCOUNTER — Telehealth: Payer: Self-pay

## 2018-01-18 NOTE — Telephone Encounter (Signed)
Contacted pt to go over lab results pt didn't answer lvm informing pt of lab results and if he has any questions or concerns to give me a call  If pt calls back please give results: thyroid level is normal. Hep C screen is negative.

## 2018-01-19 ENCOUNTER — Other Ambulatory Visit: Payer: Self-pay | Admitting: Pharmacist

## 2018-01-19 MED ORDER — FOLIC ACID 1 MG PO TABS: 1.0000 mg | ORAL_TABLET | Freq: Every day | ORAL | 2 refills | Status: DC

## 2018-01-21 ENCOUNTER — Telehealth: Payer: Self-pay | Admitting: Internal Medicine

## 2018-01-21 MED ORDER — INSULIN ASPART 100 UNIT/ML ~~LOC~~ SOLN: 20.0000 [IU] | Freq: Three times a day (TID) | SUBCUTANEOUS | 2 refills | Status: DC

## 2018-01-21 NOTE — Telephone Encounter (Signed)
Refilled - noted that per PCP's last note, patient only taking 20 units, not 30, so changed prescription to reflect that.

## 2018-01-21 NOTE — Telephone Encounter (Signed)
Pt. Called requesting a refill on insulin aspart (NOVOLOG) 100 UNIT/ML injection  Pt. Would like Rx sent to [facility]. Please f/u

## 2018-01-22 ENCOUNTER — Telehealth: Payer: Self-pay | Admitting: Internal Medicine

## 2018-01-22 NOTE — Telephone Encounter (Signed)
Pt called since the medication triamcinolone cream (KENALOG) 0.1 % Is making aching even more, he stop using he want to know if you can give him something else or you can call him back also need something for the scab, if pt can not answer the phone you can call at his hotel [facility], [address] [phone], please follow up

## 2018-01-22 NOTE — Telephone Encounter (Signed)
Will forward to pcp

## 2018-01-23 MED ORDER — LORATADINE 10 MG PO TABS: 10.0000 mg | ORAL_TABLET | Freq: Every day | ORAL | 1 refills | Status: DC

## 2018-01-25 NOTE — Telephone Encounter (Signed)
Contacted pt to make him aware that Dr. Ella JubileeJohnson Sent Claritin to the pharmacy. Pt states he is suppose to get some medicine for his aches and pains.

## 2018-01-27 NOTE — Telephone Encounter (Signed)
Contacted pt and left a detailed vm informing pt that Dr. Laural BenesJohnson sent a rx of Meloxicam to the pharmacy the same day he was seen and if he has any questions or concerns to give me a call.

## 2018-02-08 ENCOUNTER — Other Ambulatory Visit: Payer: Self-pay | Admitting: Internal Medicine

## 2018-02-08 DIAGNOSIS — I10 Essential (primary) hypertension: Secondary | ICD-10-CM

## 2018-02-08 DIAGNOSIS — L309 Dermatitis, unspecified: Secondary | ICD-10-CM

## 2018-02-09 ENCOUNTER — Other Ambulatory Visit: Payer: Self-pay | Admitting: Internal Medicine

## 2018-02-11 ENCOUNTER — Other Ambulatory Visit: Payer: Self-pay | Admitting: Internal Medicine

## 2018-02-11 DIAGNOSIS — M25561 Pain in right knee: Principal | ICD-10-CM

## 2018-02-11 DIAGNOSIS — M25562 Pain in left knee: Principal | ICD-10-CM

## 2018-02-11 DIAGNOSIS — G8929 Other chronic pain: Secondary | ICD-10-CM

## 2018-02-18 ENCOUNTER — Telehealth: Payer: Self-pay | Admitting: Internal Medicine

## 2018-02-18 DIAGNOSIS — E1142 Type 2 diabetes mellitus with diabetic polyneuropathy: Secondary | ICD-10-CM

## 2018-02-18 MED ORDER — INSULIN GLARGINE 100 UNIT/ML SOLOSTAR PEN: 15.0000 [IU] | PEN_INJECTOR | Freq: Every day | SUBCUTANEOUS | 5 refills | Status: DC

## 2018-02-18 NOTE — Telephone Encounter (Signed)
Pt. Called stating that he needed a refill on Novolog. Pt. Was told that the medication was refilled and sent to [facility]. Pt. Stated [facility]. Please f/u

## 2018-02-18 NOTE — Telephone Encounter (Signed)
Sent to requested pharmacy.

## 2018-03-15 ENCOUNTER — Ambulatory Visit: Payer: Self-pay | Admitting: Internal Medicine

## 2018-04-02 ENCOUNTER — Other Ambulatory Visit: Payer: Self-pay | Admitting: Internal Medicine

## 2018-04-02 DIAGNOSIS — E1142 Type 2 diabetes mellitus with diabetic polyneuropathy: Secondary | ICD-10-CM

## 2018-04-02 DIAGNOSIS — R1310 Dysphagia, unspecified: Secondary | ICD-10-CM

## 2018-05-05 ENCOUNTER — Ambulatory Visit: Payer: Medicaid Other | Attending: Internal Medicine | Admitting: Internal Medicine

## 2018-05-05 ENCOUNTER — Encounter: Payer: Self-pay | Admitting: Internal Medicine

## 2018-05-05 VITALS — BP 126/81 | HR 77 | Temp 98.1°F | Resp 16 | Wt 171.6 lb

## 2018-05-05 DIAGNOSIS — L84 Corns and callosities: Secondary | ICD-10-CM

## 2018-05-05 DIAGNOSIS — G8929 Other chronic pain: Secondary | ICD-10-CM | POA: Diagnosis not present

## 2018-05-05 DIAGNOSIS — M545 Low back pain, unspecified: Secondary | ICD-10-CM

## 2018-05-05 DIAGNOSIS — I10 Essential (primary) hypertension: Secondary | ICD-10-CM

## 2018-05-05 DIAGNOSIS — E7800 Pure hypercholesterolemia, unspecified: Secondary | ICD-10-CM | POA: Diagnosis not present

## 2018-05-05 DIAGNOSIS — E1142 Type 2 diabetes mellitus with diabetic polyneuropathy: Secondary | ICD-10-CM

## 2018-05-05 DIAGNOSIS — Z950 Presence of cardiac pacemaker: Secondary | ICD-10-CM | POA: Diagnosis not present

## 2018-05-05 DIAGNOSIS — F1010 Alcohol abuse, uncomplicated: Secondary | ICD-10-CM | POA: Diagnosis not present

## 2018-05-05 DIAGNOSIS — H538 Other visual disturbances: Secondary | ICD-10-CM

## 2018-05-05 DIAGNOSIS — E1140 Type 2 diabetes mellitus with diabetic neuropathy, unspecified: Secondary | ICD-10-CM | POA: Insufficient documentation

## 2018-05-05 DIAGNOSIS — Z79899 Other long term (current) drug therapy: Secondary | ICD-10-CM | POA: Diagnosis not present

## 2018-05-05 DIAGNOSIS — Z9114 Patient's other noncompliance with medication regimen: Secondary | ICD-10-CM | POA: Diagnosis not present

## 2018-05-05 DIAGNOSIS — Z882 Allergy status to sulfonamides status: Secondary | ICD-10-CM | POA: Insufficient documentation

## 2018-05-05 DIAGNOSIS — E039 Hypothyroidism, unspecified: Secondary | ICD-10-CM | POA: Insufficient documentation

## 2018-05-05 DIAGNOSIS — Z91148 Patient's other noncompliance with medication regimen for other reason: Secondary | ICD-10-CM

## 2018-05-05 DIAGNOSIS — Z86718 Personal history of other venous thrombosis and embolism: Secondary | ICD-10-CM

## 2018-05-05 DIAGNOSIS — Z794 Long term (current) use of insulin: Secondary | ICD-10-CM | POA: Diagnosis not present

## 2018-05-05 DIAGNOSIS — Z791 Long term (current) use of non-steroidal anti-inflammatories (NSAID): Secondary | ICD-10-CM | POA: Diagnosis not present

## 2018-05-05 DIAGNOSIS — G4700 Insomnia, unspecified: Secondary | ICD-10-CM | POA: Diagnosis not present

## 2018-05-05 DIAGNOSIS — Z79890 Hormone replacement therapy (postmenopausal): Secondary | ICD-10-CM | POA: Insufficient documentation

## 2018-05-05 DIAGNOSIS — F17210 Nicotine dependence, cigarettes, uncomplicated: Secondary | ICD-10-CM | POA: Diagnosis not present

## 2018-05-05 DIAGNOSIS — Z880 Allergy status to penicillin: Secondary | ICD-10-CM | POA: Insufficient documentation

## 2018-05-05 DIAGNOSIS — G4730 Sleep apnea, unspecified: Secondary | ICD-10-CM | POA: Diagnosis not present

## 2018-05-05 LAB — POCT GLYCOSYLATED HEMOGLOBIN (HGB A1C): Hemoglobin A1C: 8.8

## 2018-05-05 LAB — GLUCOSE, POCT (MANUAL RESULT ENTRY): POC GLUCOSE: 264 mg/dL — AB (ref 70–99)

## 2018-05-05 NOTE — Progress Notes (Signed)
Patient ID: Patrick Small, male    DOB: Feb 07, 1961  MRN: [id number]  CC: Diabetes and Back Pain   Subjective: Patrick Small is a 57 y.o. male who presents for chronic ds management. His concerns today include:  Pt with hx of DM, HTN, hypothyroid, DVT LEs, chronic LBP, insomnia and pace maker  Patient does not have medications with him today.  He reports that he is not taking any of his medicines consistently  HTN: On amlodipine but admits that he does not take it every day.  He tries to limit salt in his foods  Hx of DVT: on last visit, pt states he was no longer on Xarelto and that it was discontinued by Dr. Armen Pickup.  He now reports that he has been taking Xarelto though inconsistently.  He has a large bottle of it at home that he just refilled in April.  He has had several blood clots in both lower extremities intermittently over the years   DM:   Check BS 1-2 x a day.  Highest was 158.  Takes Lantus about 3 x a wk.  Out of Novolog, not taking Byetta consistently.  He admits that he drinks and is vague about the amount he later stated that sometimes he can drink.  A 6 pack or 12 pack but not every day.  He does not take his medications when he drinks.  He does not feel that he has a problem with alcohol Problems seeing things further away.  Overdue for eye exam Wanting rxn for DM shoes. Constant numbness in feet.  Not taking gabapentin consistently   Hypothyroidism: Not taking Levoxyl consistently  Referred to [facility] on last visit for dysphasia and weight loss.  However he was told that they cannot schedule him until he pays off a balance that he has with them.  He has not had any further weight loss since last visit  Complains of pain in back, knees, legs, "all over."  No swelling in the joints.  Endorses stiffness. Prescribed meloxicam on last visit.  Again he is not taking this consistently either Patient Active Problem List   Diagnosis Date Noted  . Abnormal LFTs  01/15/2018  . Alcohol use disorder, mild, abuse 01/15/2018  . Insomnia 05/21/2017  . Sleep apnea 04/21/2017  . Odynophagia 02/01/2017  . Esophageal dysphagia 02/01/2017  . Pain in joint of left wrist 04/29/2016  . Uncontrolled type 2 diabetes mellitus (HCC) 03/04/2016  . Chronic low back pain 03/04/2016  . Diabetic peripheral neuropathy associated with type 2 diabetes mellitus (HCC) 03/04/2016  . Transaminasemia   . Sinus node dysfunction (HCC)   . Hyponatremia 02/18/2016  . Syncope 02/18/2016  . Elevated lactic acid level 02/18/2016  . Depression 11/16/2014  . Atrial fibrillation (HCC) 10/05/2014  . Anticoagulated on Coumadin 10/05/2014  . High cholesterol 09/26/2014  . Hypertension 09/25/2014  . Personal history of DVT (deep vein thrombosis) 09/25/2014  . Pacemaker 09/25/2014  . Irregular heartbeat 09/25/2014  . Hypothyroidism 09/25/2014     Current Outpatient Medications on File Prior to Visit  Medication Sig Dispense Refill  . amLODipine (NORVASC) 10 MG tablet Take 1 tablet (10 mg total) by mouth daily. 30 tablet 1  . exenatide (BYETTA) 10 MCG/0.04ML SOPN injection Inject 0.04 mLs (10 mcg total) into the skin 2 (two) times daily with a meal. 2.4 mL 5  . folic acid (FOLVITE) 1 MG tablet Take 1 tablet (1 mg total) by mouth daily. 30 tablet 2  .  gabapentin (NEURONTIN) 300 MG capsule Take 1 capsule (300 mg total) by mouth 2 (two) times daily. 60 capsule 3  . glucose blood (ACCU-CHEK AVIVA) test strip Use as instructed 3 times daily 100 each 12  . glucose blood (TRUE METRIX BLOOD GLUCOSE TEST) test strip Use as instructed 100 each 12  . insulin aspart (NOVOLOG) 100 UNIT/ML injection Inject 20 Units into the skin 3 (three) times daily with meals. 20 mL 2  . Insulin Glargine (LANTUS SOLOSTAR) 100 UNIT/ML Solostar Pen Inject 15 Units into the skin daily at 10 pm. 15 mL 5  . levothyroxine (SYNTHROID, LEVOTHROID) 50 MCG tablet Take 1 tablet (50 mcg total) by mouth daily before  breakfast. 30 tablet 4  . loratadine (CLARITIN) 10 MG tablet Take 1 tablet (10 mg total) by mouth daily. 30 tablet 1  . meloxicam (MOBIC) 15 MG tablet Take 1 tablet (15 mg total) by mouth daily. 30 tablet 6  . Multiple Vitamin (MULTIVITAMIN WITH MINERALS) TABS tablet Take 1 tablet by mouth daily. 30 tablet 0  . omeprazole (PRILOSEC) 20 MG capsule Take 1 capsule (20 mg total) by mouth daily. 30 capsule 3  . thiamine 100 MG tablet Take 1 tablet (100 mg total) by mouth daily. 30 tablet 0  . triamcinolone cream (KENALOG) 0.1 % Apply 1 application topically 2 (two) times daily. 80 g 1   No current facility-administered medications on file prior to visit.     Allergies  Allergen Reactions  . Penicillins Hives    Has patient had a PCN reaction causing immediate rash, facial/tongue/throat swelling, SOB or lightheadedness with hypotension: Yes Has patient had a PCN reaction causing severe rash involving mucus membranes or skin necrosis: No Has patient had a PCN reaction that required hospitalization: No Has patient had a PCN reaction occurring within the last 10 years: No If all of the above answers are "NO", then may proceed with Cephalosporin use.   . Bacitracin Itching, Swelling and Rash  . Septra [Sulfamethoxazole-Trimethoprim] Itching, Swelling and Rash    Social History   Socioeconomic History  . Marital status: Single    Spouse name: Not on file  . Number of children: Not on file  . Years of education: Not on file  . Highest education level: Not on file  Occupational History  . Not on file  Social Needs  . Financial resource strain: Not on file  . Food insecurity:    Worry: Not on file    Inability: Not on file  . Transportation needs:    Medical: Not on file    Non-medical: Not on file  Tobacco Use  . Smoking status: Current Every Day Smoker    Packs/day: 6.00    Types: Cigarettes  . Smokeless tobacco: Never Used  Substance and Sexual Activity  . Alcohol use: Yes     Alcohol/week: 0.0 oz    Comment: only on weekends  . Drug use: Yes    Types: Marijuana    Comment: last used last month about  03/30/2016  . Sexual activity: Not on file  Lifestyle  . Physical activity:    Days per week: Not on file    Minutes per session: Not on file  . Stress: Not on file  Relationships  . Social connections:    Talks on phone: Not on file    Gets together: Not on file    Attends religious service: Not on file    Active member of club or organization: Not on file  Attends meetings of clubs or organizations: Not on file    Relationship status: Not on file  . Intimate partner violence:    Fear of current or ex partner: Not on file    Emotionally abused: Not on file    Physically abused: Not on file    Forced sexual activity: Not on file  Other Topics Concern  . Not on file  Social History Narrative   Moved back to [place] after living in [place], [place] 08/2014.    Moved to be closer to his aging mother.   Lived in [place] for 15 years.           Family History  Problem Relation Age of Onset  . Diabetes Mother   . Diabetes Father   . Diabetes Sister     Past Surgical History:  Procedure Laterality Date  . PACEMAKER INSERTION  2014  . TEE WITHOUT CARDIOVERSION N/A 02/22/2016   Procedure: TRANSESOPHAGEAL ECHOCARDIOGRAM (TEE);  Surgeon: Chilton Si, MD;  Location: [facility] ENDOSCOPY;  Service: Cardiovascular;  Laterality: N/A;    ROS: Review of Systems Negative except as stated above PHYSICAL EXAM: BP 126/81   Pulse 77   Temp 98.1 F (36.7 C) (Oral)   Resp 16   Wt 171 lb 9.6 oz (77.8 kg)   SpO2 97%   BMI 22.64 kg/m   Wt Readings from Last 3 Encounters:  05/05/18 171 lb 9.6 oz (77.8 kg)  01/14/18 168 lb 3.2 oz (76.3 kg)  11/04/17 162 lb (73.5 kg)    Physical Exam   General appearance - alert, well appearing, and in no distress Mental status - normal mood, behavior, speech, dress, motor activity, and thought processes Chest - clear to  auscultation, no wheezes, rales or rhonchi, symmetric air entry Heart - normal rate, regular rhythm, normal S1, S2, no murmurs, rubs, clicks or gallops Musculoskeletal -knees: No point tenderness.  Good range of motion. LS spine: Mild tenderness on palpation of the lower thoracic and lumbar spine.  Gait is normal. Extremities - peripheral pulses normal, no pedal edema, no clubbing or cyanosis Diabetic Foot Exam - Simple   Simple Foot Form Visual Inspection See comments:  Yes Sensation Testing See comments:  Yes Pulse Check Posterior Tibialis and Dorsalis pulse intact bilaterally:  Yes Comments Decrease sensation on LEAP exam.  + small callous on medial aspect of RT big toe.  Small corn on RT 5th toe     Results for orders placed or performed in visit on 05/05/18  POCT glucose (manual entry)  Result Value Ref Range   POC Glucose 264 (A) 70 - 99 mg/dl  POCT glycosylated hemoglobin (Hb A1C)  Result Value Ref Range   Hemoglobin A1C 8.8     ASSESSMENT AND PLAN: 1. Diabetic peripheral neuropathy associated with type 2 diabetes mellitus (HCC) Encourage compliance.  I have discontinued by Ada and NovoLog since he is not taking those.  I recommend taking just the Lantus insulin. - POCT glucose (manual entry) - POCT glycosylated hemoglobin (Hb A1C) - Microalbumin / creatinine urine ratio  2. Essential hypertension At goal.  3. History of DVT of lower extremity We will put Xarelto back on his list since he is reportedly taking it albeit inconsistently.  It sounds as though he does need to be on lifelong anticoagulation given his history of recurrent clots in both legs.  4. Pre-ulcerative corn or callous Patient with diabetic neuropathy with some callus on the feet.  Would benefit from having diabetic shoes  5. Noncompliance with medication regimen   6. Blurred vision - Ambulatory referral to Ophthalmology  7. Chronic midline low back pain without sciatica We will get imaging  studies with possible referral to physical therapy depending on results - DG Lumbar Spine Complete; Future - DG Thoracic Spine W/Swimmers; Future  8. Alcohol use disorder, mild, abuse Strongly encourage him to quit drinking altogether especially if he is on Xarelto.  At increased risk for bleeding.  Patient was given the opportunity to ask questions.  Patient verbalized understanding of the plan and was able to repeat key elements of the plan.   Orders Placed This Encounter  Procedures  . Microalbumin / creatinine urine ratio  . POCT glucose (manual entry)  . POCT glycosylated hemoglobin (Hb A1C)     Requested Prescriptions    No prescriptions requested or ordered in this encounter    No follow-ups on file.  Jonah Blue, MD, FACP

## 2018-05-06 LAB — MICROALBUMIN / CREATININE URINE RATIO
Creatinine, Urine: 194.7 mg/dL
MICROALB/CREAT RATIO: 45.9 mg/g{creat} — AB (ref 0.0–30.0)
MICROALBUM., U, RANDOM: 89.4 ug/mL

## 2018-05-07 ENCOUNTER — Telehealth: Payer: Self-pay

## 2018-05-07 NOTE — Telephone Encounter (Signed)
Contacted pt to go over lab results pt is aware of results and doesn't have questions or concerns    Dr. Laural Benes pt is requesting medicine to help him perform and would like it sent to [facility] pharmacy. Please f/u

## 2018-05-13 ENCOUNTER — Other Ambulatory Visit: Payer: Self-pay | Admitting: Internal Medicine

## 2018-05-13 DIAGNOSIS — E039 Hypothyroidism, unspecified: Secondary | ICD-10-CM

## 2018-05-19 LAB — HM DIABETES EYE EXAM

## 2018-05-25 ENCOUNTER — Telehealth: Payer: Self-pay | Admitting: Internal Medicine

## 2018-05-25 ENCOUNTER — Other Ambulatory Visit: Payer: Self-pay

## 2018-05-25 DIAGNOSIS — M545 Low back pain, unspecified: Secondary | ICD-10-CM

## 2018-05-25 DIAGNOSIS — Z794 Long term (current) use of insulin: Principal | ICD-10-CM

## 2018-05-25 DIAGNOSIS — E118 Type 2 diabetes mellitus with unspecified complications: Principal | ICD-10-CM

## 2018-05-25 DIAGNOSIS — G8929 Other chronic pain: Secondary | ICD-10-CM

## 2018-05-25 DIAGNOSIS — IMO0002 Reserved for concepts with insufficient information to code with codable children: Secondary | ICD-10-CM

## 2018-05-25 DIAGNOSIS — E1142 Type 2 diabetes mellitus with diabetic polyneuropathy: Secondary | ICD-10-CM

## 2018-05-25 DIAGNOSIS — E1165 Type 2 diabetes mellitus with hyperglycemia: Secondary | ICD-10-CM

## 2018-05-25 MED ORDER — GLUCOSE BLOOD VI STRP: ORAL_STRIP | 12 refills | Status: DC

## 2018-05-25 NOTE — Telephone Encounter (Signed)
Patient called requesting a refill on Test Strips. Patient would like rx sent to [facility]. Please f/u

## 2018-05-25 NOTE — Telephone Encounter (Signed)
rx has been sent 

## 2018-07-15 ENCOUNTER — Encounter: Payer: Self-pay | Admitting: Cardiology

## 2018-08-02 ENCOUNTER — Other Ambulatory Visit: Payer: Self-pay | Admitting: Internal Medicine

## 2018-08-02 DIAGNOSIS — E1142 Type 2 diabetes mellitus with diabetic polyneuropathy: Secondary | ICD-10-CM

## 2018-09-01 ENCOUNTER — Other Ambulatory Visit: Payer: Self-pay | Admitting: Internal Medicine

## 2018-09-01 DIAGNOSIS — E039 Hypothyroidism, unspecified: Secondary | ICD-10-CM

## 2018-09-03 ENCOUNTER — Telehealth: Payer: Self-pay | Admitting: *Deleted

## 2018-09-03 DIAGNOSIS — I10 Essential (primary) hypertension: Secondary | ICD-10-CM

## 2018-09-03 NOTE — Telephone Encounter (Signed)
Patient verified DOB Patient is scheduled for 09/06/18.

## 2018-09-06 ENCOUNTER — Other Ambulatory Visit: Payer: Self-pay

## 2018-10-01 ENCOUNTER — Other Ambulatory Visit: Payer: Self-pay | Admitting: Internal Medicine

## 2018-10-01 DIAGNOSIS — M25561 Pain in right knee: Principal | ICD-10-CM

## 2018-10-01 DIAGNOSIS — G8929 Other chronic pain: Secondary | ICD-10-CM

## 2018-10-01 DIAGNOSIS — M25562 Pain in left knee: Principal | ICD-10-CM

## 2018-10-25 ENCOUNTER — Other Ambulatory Visit: Payer: Self-pay | Admitting: Internal Medicine

## 2018-10-25 DIAGNOSIS — E1142 Type 2 diabetes mellitus with diabetic polyneuropathy: Secondary | ICD-10-CM

## 2018-10-26 ENCOUNTER — Telehealth: Payer: Self-pay | Admitting: Internal Medicine

## 2018-10-26 ENCOUNTER — Telehealth: Payer: Self-pay

## 2018-10-26 ENCOUNTER — Other Ambulatory Visit: Payer: Self-pay

## 2018-10-26 DIAGNOSIS — M545 Low back pain, unspecified: Secondary | ICD-10-CM

## 2018-10-26 DIAGNOSIS — Z794 Long term (current) use of insulin: Principal | ICD-10-CM

## 2018-10-26 DIAGNOSIS — E1142 Type 2 diabetes mellitus with diabetic polyneuropathy: Secondary | ICD-10-CM

## 2018-10-26 DIAGNOSIS — E1165 Type 2 diabetes mellitus with hyperglycemia: Secondary | ICD-10-CM

## 2018-10-26 DIAGNOSIS — G8929 Other chronic pain: Secondary | ICD-10-CM

## 2018-10-26 DIAGNOSIS — E118 Type 2 diabetes mellitus with unspecified complications: Principal | ICD-10-CM

## 2018-10-26 DIAGNOSIS — IMO0002 Reserved for concepts with insufficient information to code with codable children: Secondary | ICD-10-CM

## 2018-10-26 MED ORDER — ACCU-CHEK AVIVA DEVI: 0 refills | Status: AC

## 2018-10-26 MED ORDER — ACCU-CHEK SOFTCLIX LANCETS MISC: 12 refills | Status: DC

## 2018-10-26 MED ORDER — GLUCOSE BLOOD VI STRP: ORAL_STRIP | 12 refills | Status: DC

## 2018-10-26 MED ORDER — ACCU-CHEK SOFTCLIX LANCET DEV KIT: PACK | 0 refills | Status: DC

## 2018-10-26 NOTE — Telephone Encounter (Signed)
Sent Glucometer, strips and lancets to pt preferred pharmacy which is [facility] [address].

## 2018-10-26 NOTE — Telephone Encounter (Signed)
Patient called because he would like a new prescription sent to [facility] pharmacy on [address] for a new glucose meter.Patient would also like to know what he can do with his old lantus he has. Please follow up with patient.

## 2018-11-11 ENCOUNTER — Ambulatory Visit: Payer: Medicaid Other | Attending: Internal Medicine | Admitting: Internal Medicine

## 2018-11-11 ENCOUNTER — Ambulatory Visit: Payer: Medicaid Other | Admitting: Licensed Clinical Social Worker

## 2018-11-11 ENCOUNTER — Encounter: Payer: Self-pay | Admitting: Internal Medicine

## 2018-11-11 VITALS — BP 153/100 | HR 73 | Temp 97.3°F | Resp 16 | Wt 153.8 lb

## 2018-11-11 DIAGNOSIS — F17210 Nicotine dependence, cigarettes, uncomplicated: Secondary | ICD-10-CM | POA: Diagnosis not present

## 2018-11-11 DIAGNOSIS — M546 Pain in thoracic spine: Secondary | ICD-10-CM | POA: Diagnosis not present

## 2018-11-11 DIAGNOSIS — M79672 Pain in left foot: Secondary | ICD-10-CM | POA: Insufficient documentation

## 2018-11-11 DIAGNOSIS — Z950 Presence of cardiac pacemaker: Secondary | ICD-10-CM | POA: Diagnosis not present

## 2018-11-11 DIAGNOSIS — M25561 Pain in right knee: Secondary | ICD-10-CM

## 2018-11-11 DIAGNOSIS — M2550 Pain in unspecified joint: Secondary | ICD-10-CM | POA: Diagnosis not present

## 2018-11-11 DIAGNOSIS — F329 Major depressive disorder, single episode, unspecified: Secondary | ICD-10-CM | POA: Diagnosis not present

## 2018-11-11 DIAGNOSIS — Z833 Family history of diabetes mellitus: Secondary | ICD-10-CM | POA: Diagnosis not present

## 2018-11-11 DIAGNOSIS — G4700 Insomnia, unspecified: Secondary | ICD-10-CM | POA: Diagnosis not present

## 2018-11-11 DIAGNOSIS — M545 Low back pain: Secondary | ICD-10-CM | POA: Insufficient documentation

## 2018-11-11 DIAGNOSIS — E039 Hypothyroidism, unspecified: Secondary | ICD-10-CM | POA: Insufficient documentation

## 2018-11-11 DIAGNOSIS — Z791 Long term (current) use of non-steroidal anti-inflammatories (NSAID): Secondary | ICD-10-CM | POA: Diagnosis not present

## 2018-11-11 DIAGNOSIS — Z9114 Patient's other noncompliance with medication regimen: Secondary | ICD-10-CM | POA: Insufficient documentation

## 2018-11-11 DIAGNOSIS — Z599 Problem related to housing and economic circumstances, unspecified: Secondary | ICD-10-CM

## 2018-11-11 DIAGNOSIS — I4891 Unspecified atrial fibrillation: Secondary | ICD-10-CM | POA: Diagnosis not present

## 2018-11-11 DIAGNOSIS — Z882 Allergy status to sulfonamides status: Secondary | ICD-10-CM | POA: Diagnosis not present

## 2018-11-11 DIAGNOSIS — Z794 Long term (current) use of insulin: Secondary | ICD-10-CM | POA: Insufficient documentation

## 2018-11-11 DIAGNOSIS — E1142 Type 2 diabetes mellitus with diabetic polyneuropathy: Secondary | ICD-10-CM | POA: Insufficient documentation

## 2018-11-11 DIAGNOSIS — M79641 Pain in right hand: Secondary | ICD-10-CM | POA: Insufficient documentation

## 2018-11-11 DIAGNOSIS — M25562 Pain in left knee: Secondary | ICD-10-CM

## 2018-11-11 DIAGNOSIS — Z125 Encounter for screening for malignant neoplasm of prostate: Secondary | ICD-10-CM | POA: Insufficient documentation

## 2018-11-11 DIAGNOSIS — I10 Essential (primary) hypertension: Secondary | ICD-10-CM

## 2018-11-11 DIAGNOSIS — Z880 Allergy status to penicillin: Secondary | ICD-10-CM | POA: Insufficient documentation

## 2018-11-11 DIAGNOSIS — Z7901 Long term (current) use of anticoagulants: Secondary | ICD-10-CM | POA: Insufficient documentation

## 2018-11-11 DIAGNOSIS — E119 Type 2 diabetes mellitus without complications: Secondary | ICD-10-CM | POA: Diagnosis present

## 2018-11-11 DIAGNOSIS — E1165 Type 2 diabetes mellitus with hyperglycemia: Secondary | ICD-10-CM | POA: Insufficient documentation

## 2018-11-11 DIAGNOSIS — M79642 Pain in left hand: Secondary | ICD-10-CM | POA: Insufficient documentation

## 2018-11-11 DIAGNOSIS — F330 Major depressive disorder, recurrent, mild: Secondary | ICD-10-CM

## 2018-11-11 DIAGNOSIS — Z86718 Personal history of other venous thrombosis and embolism: Secondary | ICD-10-CM | POA: Insufficient documentation

## 2018-11-11 DIAGNOSIS — F32A Depression, unspecified: Secondary | ICD-10-CM

## 2018-11-11 DIAGNOSIS — G8929 Other chronic pain: Secondary | ICD-10-CM

## 2018-11-11 DIAGNOSIS — M79671 Pain in right foot: Secondary | ICD-10-CM | POA: Insufficient documentation

## 2018-11-11 DIAGNOSIS — Z79890 Hormone replacement therapy (postmenopausal): Secondary | ICD-10-CM | POA: Insufficient documentation

## 2018-11-11 LAB — POCT GLYCOSYLATED HEMOGLOBIN (HGB A1C): HbA1c, POC (controlled diabetic range): 9.2 % — AB (ref 0.0–7.0)

## 2018-11-11 LAB — GLUCOSE, POCT (MANUAL RESULT ENTRY)
POC GLUCOSE: 424 mg/dL — AB (ref 70–99)
POC Glucose: 375 mg/dl — AB (ref 70–99)

## 2018-11-11 MED ORDER — MELOXICAM 15 MG PO TABS: 15.0000 mg | ORAL_TABLET | Freq: Every day | ORAL | 4 refills | Status: DC

## 2018-11-11 MED ORDER — INSULIN ASPART 100 UNIT/ML ~~LOC~~ SOLN
10.0000 [IU] | Freq: Once | SUBCUTANEOUS | Status: AC
  Administered 2018-11-11: 10 [IU] via SUBCUTANEOUS

## 2018-11-11 MED ORDER — DULOXETINE HCL 20 MG PO CPEP: 20.0000 mg | ORAL_CAPSULE | Freq: Every day | ORAL | 3 refills | Status: DC

## 2018-11-11 MED ORDER — GABAPENTIN 300 MG PO CAPS: 300.0000 mg | ORAL_CAPSULE | Freq: Two times a day (BID) | ORAL | 4 refills | Status: DC

## 2018-11-11 MED ORDER — AMLODIPINE BESYLATE 10 MG PO TABS: 10.0000 mg | ORAL_TABLET | Freq: Every day | ORAL | 4 refills | Status: DC

## 2018-11-11 NOTE — Progress Notes (Signed)
Patient ID: Patrick Small, male    DOB: [birth year]  MRN: [id number]  CC: Diabetes and Hypertension   Subjective: Patrick Small is a 57 y.o. male who presents for chronic disease management His concerns today include:  Pt with hx of DM, HTN, hypothyroid, DVT LEs, chronic LBP, med noncompliance, insomnia, ETOH use disorder and pace maker  C/o aches and pains in hands, feet, mid back and lower back.  X-rays of thoracic and lumbar spine were ordered on last visit but patient never went to radiology to have them done.  He denies any swelling in the joints.  He is supposed to be on meloxicam but has not been taking.Marland Kitchen    Has been living at a motel for past 18 mths. Currently having to move from one room to another due to repairs being done.   He has applied for low cost housing but nothing has materialized as yet.  He has a case Insurance underwriter at [employer]  He endorses feeling very depress but denies suicidal ideation.  He thinks it would be better if he had something to do during the day to occupy his time.  He spends most of the day in the motel room.  He states that he really does not feel like being around other people.  He is afraid to get out to walk because of the crime in the area. He reports that he is slow down on drinking.  "  I have not had a drop in the past 2-1/2 weeks."  He denies any street drug use.  DM:  Not checking BS.  Current meter is old.  I did send rxn for new meter but his insurance does not cover and he is on limited budget.  "By time I do the necessities, I don't have money left to do anything." -left on Lantus on last visit.  Admits that he is not taking it consistently even though he has enough of it and does not have a co-pay on it. c/o numbness and pins and needles in hands and feet.  Supposed to be on gabapentin but he has not filled it in a while.  He is requesting new prescription.    HTN: Supposed to be on Norvasc but admits that he is not taking this  consistently.  Not getting in any exercise.  Denies chest pains or shortness of breath.  No lower extremity edema.    Thyroid:  Not taking levothyroxine consistently.    Patient Active Problem List   Diagnosis Date Noted  . Abnormal LFTs 01/15/2018  . Alcohol use disorder, mild, abuse 01/15/2018  . Insomnia 05/21/2017  . Sleep apnea 04/21/2017  . Odynophagia 02/01/2017  . Esophageal dysphagia 02/01/2017  . Pain in joint of left wrist 04/29/2016  . Uncontrolled type 2 diabetes mellitus ([place]) 03/04/2016  . Chronic low back pain 03/04/2016  . Diabetic peripheral neuropathy associated with type 2 diabetes mellitus ([place]) 03/04/2016  . Transaminasemia   . Sinus node dysfunction (HCC)   . Hyponatremia 02/18/2016  . Syncope 02/18/2016  . Elevated lactic acid level 02/18/2016  . Depression 11/16/2014  . Atrial fibrillation ([place]) 10/05/2014  . Anticoagulated on Coumadin 10/05/2014  . High cholesterol 09/26/2014  . Hypertension 09/25/2014  . Personal history of DVT (deep vein thrombosis) 09/25/2014  . Pacemaker 09/25/2014  . Irregular heartbeat 09/25/2014  . Hypothyroidism 09/25/2014     Current Outpatient Medications on File Prior to Visit  Medication Sig Dispense Refill  . ACCU-CHEK SOFTCLIX LANCETS  lancets Use as instructed 100 each 12  . amLODipine (NORVASC) 10 MG tablet Take 1 tablet (10 mg total) by mouth daily. 30 tablet 1  . Blood Glucose Monitoring Suppl (ACCU-CHEK AVIVA) device Use as instructed 1 each 0  . folic acid (FOLVITE) 1 MG tablet Take 1 tablet (1 mg total) by mouth daily. 30 tablet 2  . gabapentin (NEURONTIN) 300 MG capsule Take 1 capsule (300 mg total) by mouth 2 (two) times daily. 60 capsule 3  . glucose blood (ACCU-CHEK AVIVA) test strip Use as instructed 3 times daily. Check blood sugar fasting and before meals and again if pt feels bad (symptoms of hypo). 100 each 12  . glucose blood (TRUE METRIX BLOOD GLUCOSE TEST) test strip Use as instructed 100 each 12  .  Lancets Misc. (ACCU-CHEK SOFTCLIX LANCET DEV) KIT Check blood sugar fasting and before meals and again if pt feels bad (symptoms of hypo). 1 kit 0  . LANTUS SOLOSTAR 100 UNIT/ML Solostar Pen INJECT 15 UNITS INTO THE SKIN DAILY AT 10PM **MAX 50 U** **DONT EXCEED** 15 mL 2  . levothyroxine (SYNTHROID, LEVOTHROID) 50 MCG tablet TAKE ONE TABLET BY MOUTH DAILY BEFORE BREAKFAST 30 tablet 2  . loratadine (CLARITIN) 10 MG tablet Take 1 tablet (10 mg total) by mouth daily. 30 tablet 1  . meloxicam (MOBIC) 15 MG tablet Take ONE tablet by MOUTH ONCE daily 30 tablet 0  . Multiple Vitamin (MULTIVITAMIN WITH MINERALS) TABS tablet Take 1 tablet by mouth daily. 30 tablet 0  . omeprazole (PRILOSEC) 20 MG capsule Take 1 capsule (20 mg total) by mouth daily. 30 capsule 3  . rivaroxaban (XARELTO) 20 MG TABS tablet Take 20 mg by mouth daily with supper.    . thiamine 100 MG tablet Take 1 tablet (100 mg total) by mouth daily. 30 tablet 0  . triamcinolone cream (KENALOG) 0.1 % Apply 1 application topically 2 (two) times daily. 80 g 1   No current facility-administered medications on file prior to visit.     Allergies  Allergen Reactions  . Penicillins Hives    Has patient had a PCN reaction causing immediate rash, facial/tongue/throat swelling, SOB or lightheadedness with hypotension: Yes Has patient had a PCN reaction causing severe rash involving mucus membranes or skin necrosis: No Has patient had a PCN reaction that required hospitalization: No Has patient had a PCN reaction occurring within the last 10 years: No If all of the above answers are "NO", then may proceed with Cephalosporin use.   . Bacitracin Itching, Swelling and Rash  . Septra [Sulfamethoxazole-Trimethoprim] Itching, Swelling and Rash    Social History   Socioeconomic History  . Marital status: Single    Spouse name: Not on file  . Number of children: Not on file  . Years of education: Not on file  . Highest education level: Not on file   Occupational History  . Not on file  Social Needs  . Financial resource strain: Not on file  . Food insecurity:    Worry: Not on file    Inability: Not on file  . Transportation needs:    Medical: Not on file    Non-medical: Not on file  Tobacco Use  . Smoking status: Current Every Day Smoker    Packs/day: 6.00    Types: Cigarettes  . Smokeless tobacco: Never Used  Substance and Sexual Activity  . Alcohol use: Yes    Alcohol/week: 0.0 standard drinks    Comment: only on weekends  .  Drug use: Yes    Types: Marijuana    Comment: last used last month about  03/30/2016  . Sexual activity: Not on file  Lifestyle  . Physical activity:    Days per week: Not on file    Minutes per session: Not on file  . Stress: Not on file  Relationships  . Social connections:    Talks on phone: Not on file    Gets together: Not on file    Attends religious service: Not on file    Active member of club or organization: Not on file    Attends meetings of clubs or organizations: Not on file    Relationship status: Not on file  . Intimate partner violence:    Fear of current or ex partner: Not on file    Emotionally abused: Not on file    Physically abused: Not on file    Forced sexual activity: Not on file  Other Topics Concern  . Not on file  Social History Narrative   Moved back to [place] after living in [place], [place] 08/2014.    Moved to be closer to his aging mother.   Lived in [place] for 15 years.           Family History  Problem Relation Age of Onset  . Diabetes Mother   . Diabetes Father   . Diabetes Sister     Past Surgical History:  Procedure Laterality Date  . PACEMAKER INSERTION  2014  . TEE WITHOUT CARDIOVERSION N/A 02/22/2016   Procedure: TRANSESOPHAGEAL ECHOCARDIOGRAM (TEE);  Surgeon: Skeet Latch, MD;  Location: [facility] ENDOSCOPY;  Service: Cardiovascular;  Laterality: N/A;    ROS: Review of Systems Negative except as above. PHYSICAL EXAM: BP (!) 153/100 (BP  Location: Right Arm, Cuff Size: Normal) Comment: recheck  Pulse 73   Temp (!) 97.3 F (36.3 C) (Oral)   Resp 16   Wt 153 lb 12.8 oz (69.8 kg)   SpO2 100%   BMI 20.29 kg/m   Physical Exam  General appearance - alert, well appearing, and in no distress Mental status - normal mood, behavior, speech, dress, motor activity, and thought processes Neck - supple, no significant adenopathy Chest - clear to auscultation, no wheezes, rales or rhonchi, symmetric air entry Heart - normal rate, regular rhythm, normal S1, S2, no murmurs, rubs, clicks or gallops Extremities - peripheral pulses normal, no pedal edema, no clubbing or cyanosis MSK: No signs of active inflammation in the hands or the feet.  Good range of motion of the hands and wrists joints.  No tenderness on palpation of the thoracic and lumbar spine.  Gait is stable. Diabetic Foot Exam - Simple   Simple Foot Form Visual Inspection See comments:  Yes Sensation Testing See comments:  Yes Pulse Check Posterior Tibialis and Dorsalis pulse intact bilaterally:  Yes Comments Toenails are thick and slightly discolored.  Decreased sensation on the soles of the feet.     Results for orders placed or performed in visit on 11/11/18  POCT glucose (manual entry)  Result Value Ref Range   POC Glucose 424 (A) 70 - 99 mg/dl  POCT glycosylated hemoglobin (Hb A1C)  Result Value Ref Range   Hemoglobin A1C     HbA1c POC (<> result, manual entry)     HbA1c, POC (prediabetic range)     HbA1c, POC (controlled diabetic range) 9.2 (A) 0.0 - 7.0 %  POCT glucose (manual entry)  Result Value Ref Range   POC Glucose  375 (A) 70 - 99 mg/dl   Depression screen [facility] 2/9 11/11/2018 01/14/2018 04/21/2017  Decreased Interest _0 Down, Depressed, Hopeless _1 PHQ - 2 Score _2 Altered sleeping _3 Tired, decreased energy _4 Change in appetite _5 Feeling bad or failure about yourself  _6 Trouble concentrating _7 Moving slowly  or fidgety/restless 2 3 0  Suicidal thoughts 0 0 0  PHQ-9 Score _8 ASSESSMENT AND PLAN: 1. Uncontrolled type 2 diabetes mellitus with hyperglycemia (HCC) Discussed the importance of healthy eating habits, regular aerobic exercise (at least 150 minutes a week as tolerated) and medication compliance to achieve or maintain control of diabetes. Encourage patient to take the Lantus as prescribed on last visit. - POCT glucose (manual entry) - POCT glycosylated hemoglobin (Hb A1C) - insulin aspart (novoLOG) injection 10 Units - POCT glucose (manual entry) - Comprehensive metabolic panel - Lipid panel - CBC  2. Diabetic peripheral neuropathy associated with type 2 diabetes mellitus ([place]) Discussed the importance of good diabetes control.  Refill gabapentin.  3. Essential hypertension Not at goal.  Encouraged him to take amlodipine as prescribed.  Encouraged low-salt diet.  4. Polyarthralgia He can go to the radiology department to have the x-rays done as ordered on last visit.  I have refilled meloxicam. - Rheumatoid factor - CYCLIC CITRUL PEPTIDE ANTIBODY, IGG/IGA  5. Depression, unspecified depression type Encouraged him to join some support groups.  He is agreeable to seeing our LCSW today.  He is also agreeable to starting low-dose of Cymbalta.  We will have him follow-up in 7 weeks to see how he is doing. - DULoxetine (CYMBALTA) 20 MG capsule; Take 1 capsule (20 mg total) by mouth daily. For depression  Dispense: 30 capsule; Refill: 3  6. Hypothyroidism, unspecified type Encourage him to take the levothyroxine as prescribed. - TSH  7. Prostate cancer screening - PSA  8. Noncompliance with medications   Patient was given the opportunity to ask questions.  Patient verbalized understanding of the plan and was able to repeat key elements of the plan.   Orders Placed This Encounter  Procedures  . Comprehensive metabolic panel  . Lipid panel  . CBC  . PSA  . TSH  .  Rheumatoid factor  . CYCLIC CITRUL PEPTIDE ANTIBODY, IGG/IGA  . POCT glucose (manual entry)  . POCT glycosylated hemoglobin (Hb A1C)  . POCT glucose (manual entry)     Requested Prescriptions   Signed Prescriptions Disp Refills  . DULoxetine (CYMBALTA) 20 MG capsule 30 capsule 3    Sig: Take 1 capsule (20 mg total) by mouth daily. For depression    Return in about 7 weeks (around 12/30/2018).  Karle Plumber, MD, FACP

## 2018-11-11 NOTE — Patient Instructions (Signed)
--   Please take your medications as prescribed.

## 2018-11-11 NOTE — Progress Notes (Signed)
Pt states he is having pain all over his body

## 2018-11-11 NOTE — BH Specialist Note (Signed)
Integrated Behavioral Health Initial Visit  MRN: [id number] Name: Patrick Small  Number of Integrated Behavioral Health Clinician visits:: 1/6 Session Start time: 12:00 PM  Session End time: 12:40 PM Total time: 40 minutes  Type of Service: Integrated Behavioral Health- Individual/Family Interpretor:No.    Warm Hand Off Completed.       SUBJECTIVE: Patrick Small is a 57 y.o. male accompanied by SELF Patient was referred by PCP Jonah Blue for depression. Patient reports the following symptoms/concerns: Pt shared that he has been dealing with depression since he moved to [place] from [place]. Pt shared that was diagnosed with depression in [place], but he believes it worsened once he moved to [place]. Pt shared that he Is also in need of housing. Duration of problem: approximately 4 years; Severity of problem: mild  OBJECTIVE: Mood: Anxious and Affect: Appropriate Risk of harm to self or others: No plan to harm self or others  LIFE CONTEXT: Family and Social: Pt shared that both his mother and sister are there if he needs someone to talk to. He shared that his sister is always there for him.  School/Work: Pt is unemployed, receiving both SSI ($750) and food stamps ($130). Self-Care: Pt reports that he uses alcohol but only on occasions. He shared that he hasn't had a drink in 2 weeks. Pt shared that he smokes marijuana but not often. Life Changes: Pt moved back to [place], from [place], approximately 4 years ago to be closer to his family and because his significant other passed. Pt shared that since his significant other passed away, he didn't see the point in staying in [place]. Pt has been dealing with housing concerns since his move to [place].  GOALS ADDRESSED: Patient will: 1. Reduce symptoms of: depression and stress 2. Increase knowledge and/or ability of: stress reduction  3. Demonstrate ability to: Increase healthy adjustment to current life  circumstances  INTERVENTIONS: Interventions utilized: Supportive Counseling, Psychoeducation and/or Health Education and Link to [employer]  Standardized Assessments completed: GAD-7 and PHQ 2&9  ASSESSMENT: Patient currently experiencing depression and housing concerns. Pt did not report when he believes his depressive symptoms began. He believes that his worries about housing make his depressive symptoms worse. Pt reports that he is currently residing in the [facility] and has been for approximately 16 years. He reports that it is horrible living conditions and they recently switched his room to a worse living condition. He is currently paying $625 for the hotel. He desires stable housing in better living conditions. MSW Intern informed pt about housing coalition, but pt shared that it sounds like they would not help him. Pt shared that he had many people tell him different things and didn't really help him. He shared that he is frustrated with not receiving proper assistance. MSW Intern informed pt that she could assist with locating affordable housing through socialserve. He also shared that he wanted legal advice on poor living conditions at hotel. He reports that he contacted legal aid and they could not be of assistance. MSW intern informed pt that she would speak with legal aid representative about advice to pt.  Pt shared that he does not believe therapy would be beneficial for depression. He believes that medication would help because it has helped in the past. No suicidal/homicidal ideations. He reports that he uses alcohol but only on occassions such as watching football. Reports that he has not had a drink in 2 weeks. He shared that smokes marijuana but not often. He reports  that his sister helps him a lot but he does not want to live with her.   Patient may benefit from increasing pleasurable activities. MSW intern provided counseling resources and pt said he would consider  [facility]. Pt also shared that he was interested in [facility]. MSW intern provided pt with application and pt shared that he would return. Pt currently provided discounted riding. MSW intern provided pt with bus passes.  PLAN: 1. Follow up with behavioral health clinician on : MSW intern informed pt that she will follow-up in regards to housing. 2. Behavioral recommendations: MSW intern recommends pt increase daily activity and attend [facility]. 3. Referral(s): Integrated Art gallery managerBehavioral Health Services (In Clinic), Community Mental Health Services (LME/Outside Clinic) and [employer] Resources:  Housing and Transportation 4. "From scale of 1-10, how likely are you to follow plan?":   Lavonna RuaAsante C McCoy, MSW Intern 11/11/18, 4:16 PM

## 2018-11-12 ENCOUNTER — Other Ambulatory Visit: Payer: Self-pay | Admitting: Internal Medicine

## 2018-11-12 DIAGNOSIS — R945 Abnormal results of liver function studies: Secondary | ICD-10-CM

## 2018-11-12 DIAGNOSIS — R7989 Other specified abnormal findings of blood chemistry: Secondary | ICD-10-CM

## 2018-11-13 LAB — CBC
HEMATOCRIT: 36.9 % — AB (ref 37.5–51.0)
Hemoglobin: 13.2 g/dL (ref 13.0–17.7)
MCH: 34.4 pg — AB (ref 26.6–33.0)
MCHC: 35.8 g/dL — ABNORMAL HIGH (ref 31.5–35.7)
MCV: 96 fL (ref 79–97)
PLATELETS: 179 10*3/uL (ref 150–450)
RBC: 3.84 x10E6/uL — ABNORMAL LOW (ref 4.14–5.80)
RDW: 13.3 % (ref 12.3–15.4)
WBC: 4.5 10*3/uL (ref 3.4–10.8)

## 2018-11-13 LAB — LIPID PANEL
CHOLESTEROL TOTAL: 285 mg/dL — AB (ref 100–199)
Chol/HDL Ratio: 2.1 ratio (ref 0.0–5.0)
HDL: 135 mg/dL (ref >39)
LDL Calculated: 125 mg/dL — ABNORMAL HIGH (ref 0–99)
Triglycerides: 124 mg/dL (ref 0–149)
VLDL CHOLESTEROL CAL: 25 mg/dL (ref 5–40)

## 2018-11-13 LAB — COMPREHENSIVE METABOLIC PANEL
A/G RATIO: 1.4 (ref 1.2–2.2)
ALK PHOS: 354 IU/L — AB (ref 39–117)
ALT: 53 IU/L — ABNORMAL HIGH (ref 0–44)
AST: 47 IU/L — ABNORMAL HIGH (ref 0–40)
Albumin: 4.6 g/dL (ref 3.5–5.5)
BILIRUBIN TOTAL: 0.7 mg/dL (ref 0.0–1.2)
BUN/Creatinine Ratio: 7 — ABNORMAL LOW (ref 9–20)
BUN: 6 mg/dL (ref 6–24)
CHLORIDE: 88 mmol/L — AB (ref 96–106)
CO2: 24 mmol/L (ref 20–29)
Calcium: 10.3 mg/dL — ABNORMAL HIGH (ref 8.7–10.2)
Creatinine, Ser: 0.91 mg/dL (ref 0.76–1.27)
GFR calc Af Amer: 109 mL/min/{1.73_m2} (ref >59)
GFR calc non Af Amer: 94 mL/min/{1.73_m2} (ref >59)
GLUCOSE: 231 mg/dL — AB (ref 65–99)
Globulin, Total: 3.2 g/dL (ref 1.5–4.5)
POTASSIUM: 3.8 mmol/L (ref 3.5–5.2)
Sodium: 129 mmol/L — ABNORMAL LOW (ref 134–144)
Total Protein: 7.8 g/dL (ref 6.0–8.5)

## 2018-11-13 LAB — RHEUMATOID FACTOR: RHEUMATOID FACTOR: 11.2 [IU]/mL (ref 0.0–13.9)

## 2018-11-13 LAB — CYCLIC CITRUL PEPTIDE ANTIBODY, IGG/IGA: CYCLIC CITRULLIN PEPTIDE AB: 9 U (ref 0–19)

## 2018-11-13 LAB — TSH: TSH: 2.48 u[IU]/mL (ref 0.450–4.500)

## 2018-11-13 LAB — PSA: Prostate Specific Ag, Serum: 0.5 ng/mL (ref 0.0–4.0)

## 2018-11-17 ENCOUNTER — Telehealth: Payer: Self-pay

## 2018-11-17 NOTE — Telephone Encounter (Signed)
Contacted pt to go over lab results pt is aware and doesn't have any questions or concerns    Patrick Small pt meet with [name] at his recent ov and he stated that the social worker was to follow up with him and he has not heard anything. Could you please follow up with him

## 2018-11-18 ENCOUNTER — Other Ambulatory Visit: Payer: Self-pay | Admitting: Internal Medicine

## 2018-11-18 DIAGNOSIS — E1142 Type 2 diabetes mellitus with diabetic polyneuropathy: Secondary | ICD-10-CM

## 2018-11-19 NOTE — Telephone Encounter (Signed)
Called pt to follow-up with housing needs. Scheduled appt for 12/01/18.

## 2018-11-22 ENCOUNTER — Telehealth: Payer: Self-pay | Admitting: Licensed Clinical Social Worker

## 2018-11-22 NOTE — Telephone Encounter (Signed)
Call placed to patient in attempt to reschedule upcoming appointment on 12/01/18 for earlier in the day. A message for a return call was left.

## 2018-11-24 ENCOUNTER — Other Ambulatory Visit: Payer: Self-pay | Admitting: Internal Medicine

## 2018-11-24 DIAGNOSIS — E039 Hypothyroidism, unspecified: Secondary | ICD-10-CM

## 2018-11-29 ENCOUNTER — Telehealth: Payer: Self-pay | Admitting: Internal Medicine

## 2018-11-29 NOTE — Telephone Encounter (Signed)
Christy from the dental office of Dr.Long-Stocks called requesting information on this patients  -gabapentin (NEURONTIN) 300 MG capsule prior to a dental extraction. please follow up [phone] p

## 2018-11-29 NOTE — Telephone Encounter (Signed)
Will forward to pcp

## 2018-11-30 NOTE — Telephone Encounter (Signed)
Christy returned call and spoke with Dr. Laural BenesJohnson pt will need to be off Xarelto 3 days prior to extraction and pt can start xarelto back the same night after extraction  Neysa BonitoChristy will make pt aware

## 2018-11-30 NOTE — Telephone Encounter (Signed)
Returned [place] call but she was with a pt provided call back number

## 2018-12-01 ENCOUNTER — Ambulatory Visit: Payer: Self-pay | Admitting: Licensed Clinical Social Worker

## 2018-12-06 ENCOUNTER — Ambulatory Visit: Payer: Self-pay

## 2018-12-06 ENCOUNTER — Ambulatory Visit: Payer: Self-pay | Attending: Family Medicine | Admitting: Licensed Clinical Social Worker

## 2018-12-06 DIAGNOSIS — R945 Abnormal results of liver function studies: Secondary | ICD-10-CM | POA: Insufficient documentation

## 2018-12-06 DIAGNOSIS — Z599 Problem related to housing and economic circumstances, unspecified: Secondary | ICD-10-CM

## 2018-12-06 DIAGNOSIS — R7989 Other specified abnormal findings of blood chemistry: Secondary | ICD-10-CM

## 2018-12-06 DIAGNOSIS — F329 Major depressive disorder, single episode, unspecified: Secondary | ICD-10-CM

## 2018-12-06 DIAGNOSIS — F419 Anxiety disorder, unspecified: Secondary | ICD-10-CM

## 2018-12-06 NOTE — BH Specialist Note (Signed)
Integrated Behavioral Health Follow Up Visit  MRN: [id number] Name: Patrick BombardDarryl Small  Number of Integrated Behavioral Health Clinician visits: 1/6 Session Start time: 10:00 AM  Session End time: 10:30 AM Total time: 30 minutes  Type of Service: Integrated Behavioral Health- Individual/Family Interpretor:No. Interpretor Name and Language: N/A  SUBJECTIVE: Patrick Small is a 57 y.o. male accompanied by self Patient was referred by Dr. Laural BenesJohnson for community resources. Patient reports the following symptoms/concerns: Pt reports difficulty managing mental health due to psychosocial stressors. Pt is unsatisfied with current residence and needs assistance with obtaining safe and affordable housing Duration of problem: Ongoing; Severity of problem: mild  OBJECTIVE: Mood: Appropriate and Affect: Appropriate Risk of harm to self or others: No plan to harm self or others  LIFE CONTEXT: Family and Social: Pt receives strong support from family and a good friend School/Work: Pt receives SSI and Sales executiveood Stamps Self-Care: Pt has hx of alcohol and marijuana use.  Life Changes: Pt reports difficulty managing mental health due to psychosocial stressors. Pt is unsatisfied with current residence and needs assistance with obtaining safe and affordable housing  GOALS ADDRESSED: Patient will: 1.  Reduce symptoms of: depression and stress  2.  Increase knowledge and/or ability of: stress reduction  3.  Demonstrate ability to: Increase adequate support systems for patient/family  INTERVENTIONS: Interventions utilized:  Solution-Focused Strategies, Supportive Counseling and Link to [employer] Standardized Assessments completed: Not Needed  ASSESSMENT: Patient currently experiencing depression and anxiety triggered by o psychosocial stressors. Pt is unsatisfied with current residence and needs assistance with obtaining safe and affordable housing. He receives strong support from family and  friends. No report of SI/HI/AVH.   Patient may benefit from community resources. LCSWA validated pt's feelings regarding lack of safe and affordable housing in the community. Pt was provided housing options and was strongly encouraged to visit the [place] [facility]. Pt was also provided information on local DSS and [email or web address] to assist with additional options.   PLAN: 1. Follow up with behavioral health clinician on : Pt was encouraged to contact LCSWA if symptoms worsen or fail to improve to schedule [facility]. 2. Behavioral recommendations: LCSWA recommends that pt utilize provided resources. Pt is encouraged to schedule follow up appointment with LCSWA 3. Referral(s): Integrated Art gallery managerBehavioral Health Services (In Clinic) and [employer] Resources:  Housing 4. "From scale of 1-10, how likely are you to follow plan?":   Bridgett LarssonJasmine D Lewis, LCSW 12/09/18 8:57 AM

## 2018-12-07 LAB — MITOCHONDRIAL ANTIBODIES

## 2018-12-07 NOTE — Progress Notes (Signed)
Patient here for lab visit only 

## 2018-12-15 ENCOUNTER — Other Ambulatory Visit: Payer: Self-pay | Admitting: Internal Medicine

## 2018-12-15 DIAGNOSIS — E118 Type 2 diabetes mellitus with unspecified complications: Secondary | ICD-10-CM

## 2018-12-15 DIAGNOSIS — Z794 Long term (current) use of insulin: Secondary | ICD-10-CM

## 2018-12-15 DIAGNOSIS — IMO0002 Reserved for concepts with insufficient information to code with codable children: Secondary | ICD-10-CM

## 2018-12-15 DIAGNOSIS — E1165 Type 2 diabetes mellitus with hyperglycemia: Secondary | ICD-10-CM

## 2018-12-15 DIAGNOSIS — E1142 Type 2 diabetes mellitus with diabetic polyneuropathy: Secondary | ICD-10-CM

## 2018-12-15 DIAGNOSIS — M545 Low back pain: Secondary | ICD-10-CM

## 2018-12-15 DIAGNOSIS — G8929 Other chronic pain: Secondary | ICD-10-CM

## 2018-12-15 DIAGNOSIS — R1310 Dysphagia, unspecified: Secondary | ICD-10-CM

## 2018-12-15 NOTE — Telephone Encounter (Addendum)
1) Medication(s) Requested (by name): -amLODipine (NORVASC) 10 MG tablet  -omeprazole (PRILOSEC) 20 MG capsule  -rivaroxaban (XARELTO) 20 MG TABS tablet  -folic acid (FOLVITE) 1 MG tablet  -Pantoprazole -Test Strips 2) Pharmacy of Choice: -[facility] [address] 3) Special Requests:   Approved medications will be sent to the pharmacy, we will reach out if there is an issue.  Requests made after 3pm may not be addressed until the following business day!  If a patient is unsure of the name of the medication(s) please note and ask patient to call back when they are able to provide all info, do not send to responsible party until all information is available!

## 2018-12-16 MED ORDER — RIVAROXABAN 20 MG PO TABS: 20.0000 mg | ORAL_TABLET | Freq: Every day | ORAL | 2 refills | Status: DC

## 2018-12-16 MED ORDER — FOLIC ACID 1 MG PO TABS: 1.0000 mg | ORAL_TABLET | Freq: Every day | ORAL | 2 refills | Status: DC

## 2018-12-16 MED ORDER — OMEPRAZOLE 20 MG PO CPDR: 20.0000 mg | DELAYED_RELEASE_CAPSULE | Freq: Every day | ORAL | 3 refills | Status: DC

## 2018-12-16 MED ORDER — GLUCOSE BLOOD VI STRP: ORAL_STRIP | 12 refills | Status: DC

## 2018-12-30 ENCOUNTER — Ambulatory Visit: Payer: Self-pay | Admitting: Internal Medicine

## 2019-01-24 ENCOUNTER — Other Ambulatory Visit: Payer: Self-pay | Admitting: Internal Medicine

## 2019-01-24 DIAGNOSIS — E1142 Type 2 diabetes mellitus with diabetic polyneuropathy: Secondary | ICD-10-CM

## 2019-01-31 ENCOUNTER — Other Ambulatory Visit: Payer: Self-pay | Admitting: Internal Medicine

## 2019-01-31 DIAGNOSIS — F32A Depression, unspecified: Secondary | ICD-10-CM

## 2019-01-31 DIAGNOSIS — F329 Major depressive disorder, single episode, unspecified: Secondary | ICD-10-CM

## 2019-02-07 ENCOUNTER — Other Ambulatory Visit: Payer: Self-pay | Admitting: Internal Medicine

## 2019-02-14 ENCOUNTER — Other Ambulatory Visit: Payer: Self-pay | Admitting: Internal Medicine

## 2019-02-14 DIAGNOSIS — E039 Hypothyroidism, unspecified: Secondary | ICD-10-CM

## 2019-02-28 ENCOUNTER — Other Ambulatory Visit: Payer: Self-pay | Admitting: Internal Medicine

## 2019-02-28 DIAGNOSIS — E1142 Type 2 diabetes mellitus with diabetic polyneuropathy: Secondary | ICD-10-CM

## 2019-03-05 ENCOUNTER — Other Ambulatory Visit: Payer: Self-pay | Admitting: Internal Medicine

## 2019-03-05 DIAGNOSIS — R1310 Dysphagia, unspecified: Secondary | ICD-10-CM

## 2019-03-14 ENCOUNTER — Other Ambulatory Visit: Payer: Self-pay | Admitting: Internal Medicine

## 2019-03-28 ENCOUNTER — Other Ambulatory Visit: Payer: Self-pay | Admitting: Internal Medicine

## 2019-03-28 DIAGNOSIS — I10 Essential (primary) hypertension: Secondary | ICD-10-CM

## 2019-04-28 ENCOUNTER — Other Ambulatory Visit: Payer: Self-pay | Admitting: Internal Medicine

## 2019-04-29 ENCOUNTER — Other Ambulatory Visit: Payer: Self-pay | Admitting: Internal Medicine

## 2019-04-29 DIAGNOSIS — I10 Essential (primary) hypertension: Secondary | ICD-10-CM

## 2019-05-05 ENCOUNTER — Other Ambulatory Visit: Payer: Self-pay | Admitting: Internal Medicine

## 2019-05-05 DIAGNOSIS — E039 Hypothyroidism, unspecified: Secondary | ICD-10-CM

## 2019-05-09 ENCOUNTER — Other Ambulatory Visit: Payer: Self-pay | Admitting: Internal Medicine

## 2019-05-09 DIAGNOSIS — E1142 Type 2 diabetes mellitus with diabetic polyneuropathy: Secondary | ICD-10-CM

## 2019-05-16 ENCOUNTER — Other Ambulatory Visit: Payer: Self-pay | Admitting: Internal Medicine

## 2019-05-16 DIAGNOSIS — F329 Major depressive disorder, single episode, unspecified: Secondary | ICD-10-CM

## 2019-05-16 DIAGNOSIS — F32A Depression, unspecified: Secondary | ICD-10-CM

## 2019-05-28 ENCOUNTER — Other Ambulatory Visit: Payer: Self-pay | Admitting: Internal Medicine

## 2019-05-28 DIAGNOSIS — R1310 Dysphagia, unspecified: Secondary | ICD-10-CM

## 2019-06-22 ENCOUNTER — Ambulatory Visit: Payer: Medicaid Other | Attending: Internal Medicine | Admitting: Internal Medicine

## 2019-06-22 ENCOUNTER — Other Ambulatory Visit: Payer: Self-pay

## 2019-06-22 ENCOUNTER — Encounter: Payer: Self-pay | Admitting: Internal Medicine

## 2019-06-22 DIAGNOSIS — E039 Hypothyroidism, unspecified: Secondary | ICD-10-CM | POA: Diagnosis not present

## 2019-06-22 DIAGNOSIS — M545 Low back pain: Secondary | ICD-10-CM

## 2019-06-22 DIAGNOSIS — Z91199 Patient's noncompliance with other medical treatment and regimen due to unspecified reason: Secondary | ICD-10-CM

## 2019-06-22 DIAGNOSIS — M25561 Pain in right knee: Secondary | ICD-10-CM | POA: Diagnosis not present

## 2019-06-22 DIAGNOSIS — M25562 Pain in left knee: Secondary | ICD-10-CM | POA: Diagnosis not present

## 2019-06-22 DIAGNOSIS — Z9119 Patient's noncompliance with other medical treatment and regimen: Secondary | ICD-10-CM

## 2019-06-22 DIAGNOSIS — E1142 Type 2 diabetes mellitus with diabetic polyneuropathy: Secondary | ICD-10-CM | POA: Diagnosis not present

## 2019-06-22 DIAGNOSIS — G8929 Other chronic pain: Secondary | ICD-10-CM

## 2019-06-22 DIAGNOSIS — F1020 Alcohol dependence, uncomplicated: Secondary | ICD-10-CM

## 2019-06-22 MED ORDER — LANTUS SOLOSTAR 100 UNIT/ML ~~LOC~~ SOPN: PEN_INJECTOR | SUBCUTANEOUS | 3 refills | Status: DC

## 2019-06-22 MED ORDER — MELOXICAM 15 MG PO TABS: 15.0000 mg | ORAL_TABLET | Freq: Every day | ORAL | 5 refills | Status: DC

## 2019-06-22 MED ORDER — LEVOTHYROXINE SODIUM 50 MCG PO TABS: ORAL_TABLET | ORAL | 5 refills | Status: DC

## 2019-06-22 NOTE — Progress Notes (Signed)
Patient verified DOB Patient has taken medication today. Patient has not eaten. Patient complains of mid back pain radiating down his legs.

## 2019-06-22 NOTE — Progress Notes (Signed)
Virtual Visit via Telephone Note Due to current restrictions/limitations of in-office visits due to the COVID-19 pandemic, this scheduled clinical appointment was converted to a telehealth visit I connected with Patrick Small on 06/22/19 at 4:50 p.m EDT by telephone and verified that I am speaking with the correct person using two identifiers.  I am in my office.  The patient is at home.  Only the patient and myself participated in this encounter.  I discussed the limitations, risks, security and privacy concerns of performing an evaluation and management service by telephone and the availability of in person appointments. I also discussed with the patient that there may be a patient responsible charge related to this service. The patient expressed understanding and agreed to proceed.   History of Present Illness: Pt with hx of DM, HTN, hypothyroid, DVT LEs, chronic LBP, med noncompliance, insomnia, ETOH use disorder and pace maker.  Last seen 10/2018  DM:  checking BS TID; needs RF on stripes.  BS this morning was 115.  Reports lowest reading was 90 and highest 115. -reports compliance with Lantus 15 units Eating habits: States that he tries to eat right but admits that he likes chips to snack on Exercise:  Walking for over 1 hr a day  Thyroid:  Reports taking Levothyroxine every morning  DVT:  Admits that he is not taking Xarelto Reports that he does not take most of his meds.  "I don't like putting all that stuff in my body."  He tells me that he gets all of his medications from the pharmacy but the only ones he takes are Lantus, gabapentin, omeprazole and possible levothyroxine  ETOH:  "I drink as much as I can."  Not interested in any treatment program or talking to anybody.  He states that alcohol is the only thing that makes his pain better  C/o pain in back and knees.  Worse in weather changes like the rainy weather that we have been having..  Outpatient Encounter Medications as  of 06/22/2019  Medication Sig  . amLODipine (NORVASC) 10 MG tablet Take 1 tablet (10 mg total) by mouth daily.  . Blood Glucose Monitoring Suppl (ACCU-CHEK AVIVA) device Use as instructed  . DULoxetine (CYMBALTA) 20 MG capsule Take 1 capsule (20 mg total) by mouth daily. For depression  . folic acid (FOLVITE) 1 MG tablet Take 1 tablet (1 mg total) by mouth daily.  Marland Kitchen gabapentin (NEURONTIN) 300 MG capsule Take 1 capsule (300 mg total) by mouth 2 (two) times daily.  Marland Kitchen glucose blood (ACCU-CHEK AVIVA) test strip Use as instructed 3 times daily. Check blood sugar fasting and before meals and again if pt feels bad (symptoms of hypo).  . Lancets Misc. (ACCU-CHEK SOFTCLIX LANCET DEV) KIT Check blood sugar fasting and before meals and again if pt feels bad (symptoms of hypo).  Marland Kitchen LANTUS SOLOSTAR 100 UNIT/ML Solostar Pen Inject 15 Units into the skin daily at 10 pm. **MAX 50U** **DO NOT EXCEED** MUST MAKE APPT FOR FURTHER REFILLS  . levothyroxine (SYNTHROID) 50 MCG tablet TAKE ONE TABLET BY MOUTH DAILY BEFORE BREAKFAST  . loratadine (CLARITIN) 10 MG tablet Take 1 tablet (10 mg total) by mouth daily.  Marland Kitchen omeprazole (PRILOSEC) 20 MG capsule Take 1 capsule (20 mg total) by mouth daily.  . rivaroxaban (XARELTO) 20 MG TABS tablet Take 1 tablet (20 mg total) by mouth daily with supper. Must make appt for refills.  . meloxicam (MOBIC) 15 MG tablet Take 1 tablet (15 mg total) by mouth  daily. (Patient not taking: Reported on 06/22/2019)  . Multiple Vitamin (MULTIVITAMIN WITH MINERALS) TABS tablet Take 1 tablet by mouth daily. (Patient not taking: Reported on 06/22/2019)  . thiamine 100 MG tablet Take 1 tablet (100 mg total) by mouth daily. (Patient not taking: Reported on 06/22/2019)  . triamcinolone cream (KENALOG) 0.1 % Apply 1 application topically 2 (two) times daily. (Patient not taking: Reported on 06/22/2019)   No facility-administered encounter medications on file as of 06/22/2019.        Observations/Objective: No direct observation done as this was a telephone encounter.  However patient did sound intoxicated  Assessment and Plan: 1. Diabetic peripheral neuropathy associated with type 2 diabetes mellitus (HCC) Try to give some counseling on healthy eating habits and the importance of taking his medication.  However I do not know how much of this patient was actually absorbing as he did not sound mildly intoxicated. - Insulin Glargine (LANTUS SOLOSTAR) 100 UNIT/ML Solostar Pen; Inject 15 Units into the skin daily at 10 pm. **MAX 50U** **DO NOT EXCEED** MUST MAKE APPT FOR FURTHER REFILLS  Dispense: 15 mL; Refill: 3  2. Hypothyroidism, unspecified type - levothyroxine (SYNTHROID) 50 MCG tablet; TAKE ONE TABLET BY MOUTH DAILY BEFORE BREAKFAST  Dispense: 30 tablet; Refill: 5  3. Chronic pain of both knees I think he has arthritis in the knees and likely DJD or DDD in the back.  I had ordered x-rays in the past but patient never had them done.  Since he is not taking Xarelto I think it is okay for him to take the meloxicam.  Encouraged him to take with food. - meloxicam (MOBIC) 15 MG tablet; Take 1 tablet (15 mg total) by mouth daily.  Dispense: 30 tablet; Refill: 5  4. Chronic midline low back pain without sciatica See #3 above - meloxicam (MOBIC) 15 MG tablet; Take 1 tablet (15 mg total) by mouth daily.  Dispense: 30 tablet; Refill: 5  5. Alcohol use disorder, moderate, dependence ([place]) Patient not interested in any treatment program  6. Medically noncompliant  Follow Up Instructions: 3 mths   I discussed the assessment and treatment plan with the patient. The patient was provided an opportunity to ask questions and all were answered. The patient agreed with the plan and demonstrated an understanding of the instructions.   The patient was advised to call back or seek an in-person evaluation if the symptoms worsen or if the condition fails to improve as anticipated.  I  provided 16 minutes of non-face-to-face time during this encounter.   Karle Plumber, MD

## 2019-06-24 ENCOUNTER — Telehealth: Payer: Self-pay | Admitting: Internal Medicine

## 2019-06-24 NOTE — Telephone Encounter (Signed)
Patients call taken.  Patient identified by name and date of birth.  Patient asked for home remedies for cramps.  Nurse advised ibuprofen, naproxen, and tylenol. Patient told to stretch muscle and use heat.  Patient acknowledged understanding of advice.

## 2019-06-24 NOTE — Telephone Encounter (Signed)
Wants to know what he can take to stop cramps

## 2019-07-05 ENCOUNTER — Other Ambulatory Visit: Payer: Self-pay | Admitting: Internal Medicine

## 2019-07-05 DIAGNOSIS — E1142 Type 2 diabetes mellitus with diabetic polyneuropathy: Secondary | ICD-10-CM

## 2019-07-05 DIAGNOSIS — F32A Depression, unspecified: Secondary | ICD-10-CM

## 2019-07-05 DIAGNOSIS — F329 Major depressive disorder, single episode, unspecified: Secondary | ICD-10-CM

## 2019-07-06 ENCOUNTER — Other Ambulatory Visit: Payer: Self-pay | Admitting: *Deleted

## 2019-07-18 ENCOUNTER — Other Ambulatory Visit: Payer: Self-pay | Admitting: Internal Medicine

## 2019-07-29 ENCOUNTER — Other Ambulatory Visit: Payer: Self-pay | Admitting: Internal Medicine

## 2019-08-15 ENCOUNTER — Other Ambulatory Visit: Payer: Self-pay | Admitting: Internal Medicine

## 2019-08-15 DIAGNOSIS — R1310 Dysphagia, unspecified: Secondary | ICD-10-CM

## 2019-08-25 ENCOUNTER — Other Ambulatory Visit: Payer: Self-pay | Admitting: Internal Medicine

## 2019-08-31 ENCOUNTER — Other Ambulatory Visit: Payer: Self-pay | Admitting: Pharmacist

## 2019-08-31 DIAGNOSIS — F32A Depression, unspecified: Secondary | ICD-10-CM

## 2019-08-31 DIAGNOSIS — F329 Major depressive disorder, single episode, unspecified: Secondary | ICD-10-CM

## 2019-08-31 MED ORDER — DULOXETINE HCL 20 MG PO CPEP: 20.0000 mg | ORAL_CAPSULE | Freq: Every day | ORAL | 2 refills | Status: DC

## 2019-09-07 ENCOUNTER — Other Ambulatory Visit: Payer: Self-pay | Admitting: Internal Medicine

## 2019-09-07 DIAGNOSIS — E1142 Type 2 diabetes mellitus with diabetic polyneuropathy: Secondary | ICD-10-CM

## 2019-09-13 ENCOUNTER — Other Ambulatory Visit: Payer: Self-pay | Admitting: Internal Medicine

## 2019-09-19 ENCOUNTER — Other Ambulatory Visit: Payer: Self-pay | Admitting: Internal Medicine

## 2019-09-19 DIAGNOSIS — F329 Major depressive disorder, single episode, unspecified: Secondary | ICD-10-CM

## 2019-09-19 DIAGNOSIS — F32A Depression, unspecified: Secondary | ICD-10-CM

## 2019-10-04 ENCOUNTER — Ambulatory Visit: Payer: Medicaid Other | Admitting: Internal Medicine

## 2019-10-24 ENCOUNTER — Other Ambulatory Visit: Payer: Self-pay

## 2019-10-24 ENCOUNTER — Ambulatory Visit: Payer: Medicaid Other | Attending: Internal Medicine | Admitting: Internal Medicine

## 2019-10-24 ENCOUNTER — Encounter: Payer: Self-pay | Admitting: Internal Medicine

## 2019-10-24 VITALS — BP 143/96 | HR 73 | Temp 98.5°F | Resp 16 | Ht 73.0 in | Wt 155.0 lb

## 2019-10-24 DIAGNOSIS — I10 Essential (primary) hypertension: Secondary | ICD-10-CM | POA: Diagnosis not present

## 2019-10-24 DIAGNOSIS — E039 Hypothyroidism, unspecified: Secondary | ICD-10-CM | POA: Diagnosis not present

## 2019-10-24 DIAGNOSIS — R0981 Nasal congestion: Secondary | ICD-10-CM | POA: Diagnosis not present

## 2019-10-24 DIAGNOSIS — M25562 Pain in left knee: Secondary | ICD-10-CM

## 2019-10-24 DIAGNOSIS — M545 Low back pain, unspecified: Secondary | ICD-10-CM

## 2019-10-24 DIAGNOSIS — M25561 Pain in right knee: Secondary | ICD-10-CM

## 2019-10-24 DIAGNOSIS — Z2821 Immunization not carried out because of patient refusal: Secondary | ICD-10-CM

## 2019-10-24 DIAGNOSIS — F5104 Psychophysiologic insomnia: Secondary | ICD-10-CM

## 2019-10-24 DIAGNOSIS — Z1211 Encounter for screening for malignant neoplasm of colon: Secondary | ICD-10-CM

## 2019-10-24 DIAGNOSIS — Z125 Encounter for screening for malignant neoplasm of prostate: Secondary | ICD-10-CM

## 2019-10-24 DIAGNOSIS — E1142 Type 2 diabetes mellitus with diabetic polyneuropathy: Secondary | ICD-10-CM | POA: Diagnosis not present

## 2019-10-24 DIAGNOSIS — G8929 Other chronic pain: Secondary | ICD-10-CM

## 2019-10-24 DIAGNOSIS — F1010 Alcohol abuse, uncomplicated: Secondary | ICD-10-CM

## 2019-10-24 LAB — POCT GLYCOSYLATED HEMOGLOBIN (HGB A1C): HbA1c, POC (controlled diabetic range): 6.7 % (ref 0.0–7.0)

## 2019-10-24 LAB — GLUCOSE, POCT (MANUAL RESULT ENTRY): POC Glucose: 263 mg/dl — AB (ref 70–99)

## 2019-10-24 MED ORDER — LORATADINE 10 MG PO TABS: 10.0000 mg | ORAL_TABLET | Freq: Every day | ORAL | 1 refills | Status: DC

## 2019-10-24 NOTE — Progress Notes (Signed)
Patient ID: Patrick Small, male    DOB: 11-Mar-1961  MRN: [id number]  CC: Diabetes and Hypertension   Subjective: Patrick Small is a 58 y.o. male who presents for chronic ds management. His concerns today include:  Pt with hx of DM, HTN, hypothyroid, DVT LEs, chronic LBP,med noncompliance,insomnia, ETOH use disorderand pace maker, noncompliance with meds.  Last eval via tele 05/2019  DIABETES TYPE 2 Last A1C:   Results for orders placed or performed in visit on 10/24/19  POCT glucose (manual entry)  Result Value Ref Range   POC Glucose 263 (A) 70 - 99 mg/dl  POCT glycosylated hemoglobin (Hb A1C)  Result Value Ref Range   Hemoglobin A1C     HbA1c POC (<> result, manual entry)     HbA1c, POC (prediabetic range)     HbA1c, POC (controlled diabetic range) 6.7 0.0 - 7.0 %    Med Adherence:  Try to take Lantus daily.  Takes 10-15 units in mornings Medication side effects:  _0  Yes    _1  No Home Monitoring?  _2  Yes but not every day Home glucose results range: 170s Diet Adherence: feels he does okay.  Does not over eat.  Exercise: _3  Yes    _4  No Hypoglycemic episodes?: _5  Yes    _6  No Numbness of the feet? _7  Yes -toes and finger tips.  Feels the Gabapentin helps a little   _8  No Retinopathy hx? _9  Yes    _10  No Last eye exam: endorses blurred vision when watching TV.  Overdue for eye exam. Comments:   HYPERTENSION Currently taking: see medication list Med Adherence: not every day but getting better.  Did not take as yet for today Medication side effects: _11  Yes    _12  No Adherence with salt restriction: _13  Yes    _14  No Home Monitoring?: _15  Yes    _16  No Monitoring Frequency: not often.  Home BP results range: _17  Yes    _18  No SOB? _19  Yes    _20  No Chest Pain?: _21  Yes    _22  No Leg swelling?: _23  Yes    _24  No Headaches?: _25  Yes    _26  No Dizziness? _27  Yes    _28  No Comments: no palpitattions  Both knees and lower back still bothers him.  Right knee is worse  than the left.  No falls.  Needing refill on meloxicam.  Tob dep:  Still smoking and not ready to quit  ETOH use disorder:  Drinking less.  Drinks beers mainly on wkend 3 12-16 oz beers at a time.  He also tells me that he drinks one half shots about 3 nights a week to help him sleep.  Hypothy:  Taking Levothyroxine consistently  Some chest and sinus congestion in mornings x 2 wks. associated with watery eyes and some drainage at the back of the throat.  No fever, sick contacts, SOB.  No loss of smell or taste.  DVT:  We stop Xarelto since he was not taking it consistently.  Problems sleeping for yrs.  Drinks "booze but not every night."  Sleeps about 3-4 hrs/night Took Melatonin for a while and never worked Still staying at [employer] x 28 mths Sleeps with TV on.  Drinks a 1.5 shot about 3 days a wk to help with sleep Patient Active Problem List   Diagnosis Date Noted  . Abnormal LFTs 01/15/2018  . Alcohol use disorder, mild, abuse 01/15/2018  . Insomnia 05/21/2017  . Sleep apnea 04/21/2017  . Esophageal  dysphagia 02/01/2017  . Pain in joint of left wrist 04/29/2016  . Uncontrolled type 2 diabetes mellitus ([place]) 03/04/2016  . Chronic low back pain 03/04/2016  . Diabetic peripheral neuropathy associated with type 2 diabetes mellitus ([place]) 03/04/2016  . Sinus node dysfunction (HCC)   . Hyponatremia 02/18/2016  . Elevated lactic acid level 02/18/2016  . Depression 11/16/2014  . Atrial fibrillation ([place]) 10/05/2014  . Hypertension 09/25/2014  . Personal history of DVT (deep vein thrombosis) 09/25/2014  . Pacemaker 09/25/2014  . Hypothyroidism 09/25/2014     Current Outpatient Medications on File Prior to Visit  Medication Sig Dispense Refill  . Accu-Chek Softclix Lancets lancets USE AS DIRECTED FOUR TIMES DAILY 100 each 02  . amLODipine (NORVASC) 10 MG tablet Take 1 tablet (10 mg total) by mouth daily. 30 tablet 0  . Blood Glucose Monitoring Suppl (ACCU-CHEK AVIVA) device Use as  instructed 1 each 0  . DULoxetine (CYMBALTA) 20 MG capsule Take 1 capsule (20 mg total) by mouth daily. For depression 30 capsule 0  . folic acid (FOLVITE) 1 MG tablet Take 1 tablet (1 mg total) by mouth daily. 30 tablet 2  . gabapentin (NEURONTIN) 300 MG capsule Take 1 capsule (300 mg total) by mouth 2 (two) times daily. 60 capsule 4  . glucose blood (ACCU-CHEK AVIVA) test strip Use as instructed 3 times daily. Check blood sugar fasting and before meals and again if pt feels bad (symptoms of hypo). 100 each 12  . Lancets Misc. (ACCU-CHEK SOFTCLIX LANCET DEV) KIT Check blood sugar fasting and before meals and again if pt feels bad (symptoms of hypo). 1 kit 0  . LANTUS SOLOSTAR 100 UNIT/ML Solostar Pen Inject 15 Units into the skin daily at 10 pm. **MAX 50U** **DO NOT EXCEED** MUST MAKE APPT FOR FURTHER REFILLS 15 mL 0  . levothyroxine (SYNTHROID) 50 MCG tablet TAKE ONE TABLET BY MOUTH DAILY BEFORE BREAKFAST 30 tablet 5  . loratadine (CLARITIN) 10 MG tablet Take 1 tablet (10 mg total) by mouth daily. 30 tablet 1  . meloxicam (MOBIC) 15 MG tablet Take 1 tablet (15 mg total) by mouth daily. 30 tablet 5  . Multiple Vitamin (MULTIVITAMIN WITH MINERALS) TABS tablet Take 1 tablet by mouth daily. (Patient not taking: Reported on 06/22/2019) 30 tablet 0  . omeprazole (PRILOSEC) 20 MG capsule Take 1 capsule (20 mg total) by mouth daily. 30 capsule 2   No current facility-administered medications on file prior to visit.     Allergies  Allergen Reactions  . Penicillins Hives    Has patient had a PCN reaction causing immediate rash, facial/tongue/throat swelling, SOB or lightheadedness with hypotension: Yes Has patient had a PCN reaction causing severe rash involving mucus membranes or skin necrosis: No Has patient had a PCN reaction that required hospitalization: No Has patient had a PCN reaction occurring within the last 10 years: No If all of the above answers are "NO", then may proceed with  Cephalosporin use.   . Bacitracin Itching, Swelling and Rash  . Septra [Sulfamethoxazole-Trimethoprim] Itching, Swelling and Rash    Social History   Socioeconomic History  . Marital status: Single    Spouse name: Not on file  . Number of children: Not on file  . Years of education: Not on file  . Highest education level: Not on file  Occupational History  . Not on file  Social Needs  . Financial resource strain: Not on file  . Food insecurity    Worry: Not on  file    Inability: Not on file  . Transportation needs    Medical: Not on file    Non-medical: Not on file  Tobacco Use  . Smoking status: Current Every Day Smoker    Packs/day: 0.25    Types: Cigarettes  . Smokeless tobacco: Never Used  Substance and Sexual Activity  . Alcohol use: Yes    Alcohol/week: 0.0 standard drinks    Comment: only on weekends  . Drug use: Yes    Types: Marijuana  . Sexual activity: Not on file  Lifestyle  . Physical activity    Days per week: Not on file    Minutes per session: Not on file  . Stress: Not on file  Relationships  . Social Herbalist on phone: Not on file    Gets together: Not on file    Attends religious service: Not on file    Active member of club or organization: Not on file    Attends meetings of clubs or organizations: Not on file    Relationship status: Not on file  . Intimate partner violence    Fear of current or ex partner: Not on file    Emotionally abused: Not on file    Physically abused: Not on file    Forced sexual activity: Not on file  Other Topics Concern  . Not on file  Social History Narrative   Moved back to [place] after living in [place], [place] 08/2014.    Moved to be closer to his aging mother.   Lived in [place] for 15 years.           Family History  Problem Relation Age of Onset  . Diabetes Mother   . Diabetes Father   . Diabetes Sister     Past Surgical History:  Procedure Laterality Date  . PACEMAKER INSERTION   2014  . TEE WITHOUT CARDIOVERSION N/A 02/22/2016   Procedure: TRANSESOPHAGEAL ECHOCARDIOGRAM (TEE);  Surgeon: Skeet Latch, MD;  Location: [facility] ENDOSCOPY;  Service: Cardiovascular;  Laterality: N/A;    ROS: Review of Systems Negative except as stated above  PHYSICAL EXAM: BP (!) 143/96   Pulse 73   Temp 98.5 F (36.9 C) (Oral)   Resp 16   Ht _0  (1.854 m)   Wt 155 lb (70.3 kg)   SpO2 100%   BMI 20.45 kg/m   Physical Exam  General appearance - alert, well appearing, middle-aged African-American male and in no distress Mental status - normal mood, behavior, speech, dress, motor activity, and thought processes Nose - normal and patent, no erythema, discharge or polyps Mouth - mucous membranes moist, pharynx normal without lesions Neck - supple, no significant adenopathy Chest - clear to auscultation, no wheezes, rales or rhonchi, symmetric air entry Heart - normal rate, regular rhythm, normal S1, S2, no murmurs, rubs, clicks or gallops Extremities - peripheral pulses normal, no pedal edema, no clubbing or cyanosis  CMP Latest Ref Rng & Units 11/11/2018 11/04/2017 02/20/2017  Glucose 65 - 99 mg/dL 231(H) 120(H) 260(H)  BUN 6 - 24 mg/dL 6 13 5(L)  Creatinine 0.76 - 1.27 mg/dL 0.91 0.73(L) 0.94  Sodium 134 - 144 mmol/L 129(L) 142 129(L)  Potassium 3.5 - 5.2 mmol/L 3.8 4.4 3.2(L)  Chloride 96 - 106 mmol/L 88(L) 100 92(L)  CO2 20 - 29 mmol/L _1 Calcium 8.7 - 10.2 mg/dL 10.3(H) 9.4 9.7  Total Protein 6.0 - 8.5 g/dL 7.8 7.8 7.6  Total Bilirubin  0.0 - 1.2 mg/dL 0.7 0.9 0.8  Alkaline Phos 39 - 117 IU/L 354(H) 343(H) 222(H)  AST 0 - 40 IU/L 47(H) 126(H) 33  ALT 0 - 44 IU/L 53(H) 71(H) 31   Lipid Panel     Component Value Date/Time   CHOL 285 (H) 11/11/2018 1203   TRIG 124 11/11/2018 1203   HDL 135 11/11/2018 1203   CHOLHDL 2.1 11/11/2018 1203   CHOLHDL 3.9 09/25/2014 1310   VLDL 22 09/25/2014 1310   LDLCALC 125 (H) 11/11/2018 1203    CBC    Component Value  Date/Time   WBC 4.5 11/11/2018 1203   WBC 10.1 02/15/2017 1936   RBC 3.84 (L) 11/11/2018 1203   RBC 4.41 02/15/2017 1936   HGB 13.2 11/11/2018 1203   HCT 36.9 (L) 11/11/2018 1203   PLT 179 11/11/2018 1203   MCV 96 11/11/2018 1203   MCH 34.4 (H) 11/11/2018 1203   MCH 32.9 02/15/2017 1936   MCHC 35.8 (H) 11/11/2018 1203   MCHC 37.5 (H) 02/15/2017 1936   RDW 13.3 11/11/2018 1203   LYMPHSABS 2.1 11/04/2017 1428   MONOABS 0.6 02/15/2017 1936   EOSABS 0.0 11/04/2017 1428   BASOSABS 0.1 11/04/2017 1428    ASSESSMENT AND PLAN:  1. Diabetic peripheral neuropathy associated with type 2 diabetes mellitus (HCC) A1c at goal.  Encourage compliance with taking the Lantus.  Encourage healthy eating habits and regular exercise - Microalbumin / creatinine urine ratio - POCT glucose (manual entry) - POCT glycosylated hemoglobin (Hb A1C) - CBC - Comprehensive metabolic panel - Lipid panel - Ambulatory referral to Ophthalmology  2. Essential hypertension Not at goal.  Encourage compliance with amlodipine.  3. Hypothyroidism, unspecified type Continue levothyroxine - TSH  4. Chronic pain of both knees Continue Mobic and Cymbalta - DG Knee Complete 4 Views Right; Future - DG Knee Complete 4 Views Left; Future  5. Chronic midline low back pain without sciatica Continue Mobic and Cymbalta - DG Lumbar Spine Complete; Future  6. Chronic insomnia Good sleep hygiene discussed and encouraged.  Advised patient to try to get in bed around about the same time every night, once in bed turn off all lights and sounds including television, if unable to fall asleep within 45 minutes he should get up and read or watch TV until he feels sleepy.  Encouraged him to avoid drinking shots of alcohol last night  7. Influenza vaccination declined   8. Screening for colon cancer - Ambulatory referral to Gastroenterology  9. Alcohol use disorder, mild, abuse Went over how much alcohol is too much in 1  setting for a male.  Encouraged him to cut back to no more than  Two 12 ounce beers in the setting.  However given his history of EtOH abuse, encouraged him to abstain  10. Prostate cancer screening - PSA  11. Sinus congestion - loratadine (CLARITIN) 10 MG tablet; Take 1 tablet (10 mg total) by mouth daily.  Dispense: 30 tablet; Refill: 1    Patient was given the opportunity to ask questions.  Patient verbalized understanding of the plan and was able to repeat key elements of the plan.   Orders Placed This Encounter  Procedures  . Microalbumin / creatinine urine ratio  . POCT glucose (manual entry)  . POCT glycosylated hemoglobin (Hb A1C)     Requested Prescriptions    No prescriptions requested or ordered in this encounter    No follow-ups on file.  Karle Plumber, MD, FACP

## 2019-10-25 LAB — LIPID PANEL
Chol/HDL Ratio: 1.4 ratio (ref 0.0–5.0)
Cholesterol, Total: 207 mg/dL — ABNORMAL HIGH (ref 100–199)
HDL: 144 mg/dL (ref >39)
LDL Chol Calc (NIH): 53 mg/dL (ref 0–99)
Triglycerides: 51 mg/dL (ref 0–149)
VLDL Cholesterol Cal: 10 mg/dL (ref 5–40)

## 2019-10-25 LAB — TSH: TSH: 2.63 u[IU]/mL (ref 0.450–4.500)

## 2019-10-25 LAB — CBC
Hematocrit: 36.6 % — ABNORMAL LOW (ref 37.5–51.0)
Hemoglobin: 13 g/dL (ref 13.0–17.7)
MCH: 35.5 pg — ABNORMAL HIGH (ref 26.6–33.0)
MCHC: 35.5 g/dL (ref 31.5–35.7)
MCV: 100 fL — ABNORMAL HIGH (ref 79–97)
Platelets: 176 10*3/uL (ref 150–450)
RBC: 3.66 x10E6/uL — ABNORMAL LOW (ref 4.14–5.80)
RDW: 12.9 % (ref 11.6–15.4)
WBC: 5.3 10*3/uL (ref 3.4–10.8)

## 2019-10-25 LAB — COMPREHENSIVE METABOLIC PANEL
ALT: 37 IU/L (ref 0–44)
AST: 45 IU/L — ABNORMAL HIGH (ref 0–40)
Albumin/Globulin Ratio: 1.1 — ABNORMAL LOW (ref 1.2–2.2)
Albumin: 4 g/dL (ref 3.8–4.9)
Alkaline Phosphatase: 432 IU/L — ABNORMAL HIGH (ref 39–117)
BUN/Creatinine Ratio: 10 (ref 9–20)
BUN: 8 mg/dL (ref 6–24)
Bilirubin Total: 0.4 mg/dL (ref 0.0–1.2)
CO2: 25 mmol/L (ref 20–29)
Calcium: 9.8 mg/dL (ref 8.7–10.2)
Chloride: 96 mmol/L (ref 96–106)
Creatinine, Ser: 0.81 mg/dL (ref 0.76–1.27)
GFR calc Af Amer: 114 mL/min/{1.73_m2} (ref >59)
GFR calc non Af Amer: 99 mL/min/{1.73_m2} (ref >59)
Globulin, Total: 3.7 g/dL (ref 1.5–4.5)
Glucose: 217 mg/dL — ABNORMAL HIGH (ref 65–99)
Potassium: 4.1 mmol/L (ref 3.5–5.2)
Sodium: 133 mmol/L — ABNORMAL LOW (ref 134–144)
Total Protein: 7.7 g/dL (ref 6.0–8.5)

## 2019-10-25 LAB — PSA: Prostate Specific Ag, Serum: 0.4 ng/mL (ref 0.0–4.0)

## 2019-10-25 LAB — MICROALBUMIN / CREATININE URINE RATIO
Creatinine, Urine: 197.9 mg/dL
Microalb/Creat Ratio: 66 mg/g creat — ABNORMAL HIGH (ref 0–29)
Microalbumin, Urine: 131.1 ug/mL

## 2019-10-26 ENCOUNTER — Other Ambulatory Visit: Payer: Self-pay | Admitting: Internal Medicine

## 2019-10-26 DIAGNOSIS — R748 Abnormal levels of other serum enzymes: Secondary | ICD-10-CM

## 2019-10-26 MED ORDER — LISINOPRIL 5 MG PO TABS: 5.0000 mg | ORAL_TABLET | Freq: Every day | ORAL | 3 refills | Status: AC

## 2019-10-28 ENCOUNTER — Encounter: Payer: Self-pay | Admitting: Gastroenterology

## 2019-10-31 ENCOUNTER — Other Ambulatory Visit: Payer: Self-pay | Admitting: Internal Medicine

## 2019-10-31 ENCOUNTER — Telehealth: Payer: Self-pay

## 2019-10-31 DIAGNOSIS — G8929 Other chronic pain: Secondary | ICD-10-CM

## 2019-10-31 DIAGNOSIS — IMO0002 Reserved for concepts with insufficient information to code with codable children: Secondary | ICD-10-CM

## 2019-10-31 DIAGNOSIS — M25561 Pain in right knee: Secondary | ICD-10-CM

## 2019-10-31 DIAGNOSIS — E1165 Type 2 diabetes mellitus with hyperglycemia: Secondary | ICD-10-CM

## 2019-10-31 DIAGNOSIS — E1142 Type 2 diabetes mellitus with diabetic polyneuropathy: Secondary | ICD-10-CM

## 2019-10-31 NOTE — Telephone Encounter (Signed)
Contacted pt to go over lab results pt is aware and doesn't have any questions or concerns 

## 2019-11-09 ENCOUNTER — Encounter: Payer: Self-pay | Admitting: *Deleted

## 2019-11-14 ENCOUNTER — Other Ambulatory Visit: Payer: Self-pay | Admitting: Internal Medicine

## 2019-11-14 DIAGNOSIS — R1310 Dysphagia, unspecified: Secondary | ICD-10-CM

## 2019-11-14 DIAGNOSIS — E1142 Type 2 diabetes mellitus with diabetic polyneuropathy: Secondary | ICD-10-CM

## 2019-11-21 ENCOUNTER — Other Ambulatory Visit: Payer: Self-pay | Admitting: Internal Medicine

## 2019-11-21 DIAGNOSIS — R0981 Nasal congestion: Secondary | ICD-10-CM

## 2019-11-23 ENCOUNTER — Other Ambulatory Visit: Payer: Self-pay | Admitting: Internal Medicine

## 2019-11-23 DIAGNOSIS — F329 Major depressive disorder, single episode, unspecified: Secondary | ICD-10-CM

## 2019-11-23 DIAGNOSIS — F32A Depression, unspecified: Secondary | ICD-10-CM

## 2019-12-05 ENCOUNTER — Encounter: Payer: Medicaid Other | Admitting: Gastroenterology

## 2019-12-12 ENCOUNTER — Other Ambulatory Visit: Payer: Self-pay | Admitting: Internal Medicine

## 2019-12-12 DIAGNOSIS — G8929 Other chronic pain: Secondary | ICD-10-CM

## 2019-12-12 DIAGNOSIS — M545 Low back pain: Secondary | ICD-10-CM

## 2019-12-12 DIAGNOSIS — E1142 Type 2 diabetes mellitus with diabetic polyneuropathy: Secondary | ICD-10-CM

## 2019-12-12 DIAGNOSIS — Z794 Long term (current) use of insulin: Secondary | ICD-10-CM

## 2019-12-12 DIAGNOSIS — IMO0002 Reserved for concepts with insufficient information to code with codable children: Secondary | ICD-10-CM

## 2019-12-13 ENCOUNTER — Ambulatory Visit: Payer: Medicaid Other | Admitting: Gastroenterology

## 2020-01-12 ENCOUNTER — Ambulatory Visit: Payer: Medicaid Other | Attending: Internal Medicine | Admitting: Physician Assistant

## 2020-01-12 ENCOUNTER — Other Ambulatory Visit: Payer: Self-pay

## 2020-01-12 DIAGNOSIS — E1165 Type 2 diabetes mellitus with hyperglycemia: Secondary | ICD-10-CM | POA: Diagnosis not present

## 2020-01-12 DIAGNOSIS — R531 Weakness: Secondary | ICD-10-CM

## 2020-01-12 NOTE — Progress Notes (Signed)
Patient has been called and DOB has been verified. Patient has been screened and transferred to PCP to start phone visit.   Weakness in legs.

## 2020-01-12 NOTE — Progress Notes (Signed)
Virtual Visit via Telephone Note  I connected with Patrick Small on 01/12/20 at  9:10 AM EST by telephone and verified that I am speaking with the correct person using two identifiers.   I discussed the limitations, risks, security and privacy concerns of performing an evaluation and management service by telephone and the availability of in person appointments. I also discussed with the patient that there may be a patient responsible charge related to this service. The patient expressed understanding and agreed to proceed.  PATIENT visit by telephone virtually in the context of Covid-19 pandemic. Patient location: home My Location:  Remote/home office Persons on the call:  Me and the patient    History of Present Illness: numbness in hands. Weakness from hips down and feels wobbly when he is getting out of bed. Has not felt sick in any other way.   Blood sugar was 109 yesterday and ,150 for the last week.   He still has labs ordered from October that he has not come in for regarding elevated alk. Phos.  He denies abdominal pain.  No N/V/D.  No sick contacts.  Feels better after he gets up and starts moving around.  No fever.  No URI s/sx.  No CP/SOB    Observations/Objective: NAD.  A&Ox3   Assessment and Plan: 1. Weakness that improves throughout the day.  He has a h/o elevated alk phos and needs to have the labs his doctor had already ordered.  Made him a lab appt for 10:30 Monday morning.  To ED is worsens Hydration and healthy diet - Comprehensive metabolic panel; Future  2. Uncontrolled type 2 diabetes mellitus with hyperglycemia (HCC) Based on readings in last few days, blood sugars ok - Comprehensive metabolic panel; Future  Follow Up Instructions: PCP in 1 month   I discussed the assessment and treatment plan with the patient. The patient was provided an opportunity to ask questions and all were answered. The patient agreed with the plan and demonstrated an understanding of  the instructions.   The patient was advised to call back or seek an in-person evaluation if the symptoms worsen or if the condition fails to improve as anticipated.  I provided 12minutes of non-face-to-face time during this encounter.   Angela McClung, PA-C  Patient ID: Patrick Small, male   DOB: 07/28/1961, 59 y.o.   MRN: [id number]  

## 2020-01-13 ENCOUNTER — Other Ambulatory Visit: Payer: Self-pay | Admitting: Internal Medicine

## 2020-01-17 ENCOUNTER — Other Ambulatory Visit: Payer: Self-pay | Admitting: Internal Medicine

## 2020-01-17 DIAGNOSIS — F32A Depression, unspecified: Secondary | ICD-10-CM

## 2020-01-17 DIAGNOSIS — F329 Major depressive disorder, single episode, unspecified: Secondary | ICD-10-CM

## 2020-01-24 ENCOUNTER — Ambulatory Visit: Payer: Medicaid Other | Admitting: Internal Medicine

## 2020-01-24 ENCOUNTER — Other Ambulatory Visit: Payer: Self-pay | Admitting: Internal Medicine

## 2020-01-24 DIAGNOSIS — E039 Hypothyroidism, unspecified: Secondary | ICD-10-CM

## 2020-01-26 ENCOUNTER — Encounter (HOSPITAL_COMMUNITY): Payer: Self-pay

## 2020-01-26 ENCOUNTER — Emergency Department (HOSPITAL_COMMUNITY)
Admission: EM | Admit: 2020-01-26 | Discharge: 2020-01-26 | Disposition: A | Discharge status: Home / Self Care | Payer: Medicaid Other | Attending: Emergency Medicine | Admitting: Emergency Medicine

## 2020-01-26 ENCOUNTER — Emergency Department (HOSPITAL_COMMUNITY): Payer: Medicaid Other

## 2020-01-26 ENCOUNTER — Ambulatory Visit: Payer: Medicaid Other | Attending: Internal Medicine | Admitting: Internal Medicine

## 2020-01-26 ENCOUNTER — Other Ambulatory Visit: Payer: Self-pay

## 2020-01-26 DIAGNOSIS — R748 Abnormal levels of other serum enzymes: Secondary | ICD-10-CM | POA: Insufficient documentation

## 2020-01-26 DIAGNOSIS — R531 Weakness: Secondary | ICD-10-CM

## 2020-01-26 DIAGNOSIS — F1010 Alcohol abuse, uncomplicated: Secondary | ICD-10-CM

## 2020-01-26 DIAGNOSIS — F1020 Alcohol dependence, uncomplicated: Secondary | ICD-10-CM | POA: Diagnosis not present

## 2020-01-26 DIAGNOSIS — Z9181 History of falling: Secondary | ICD-10-CM | POA: Diagnosis not present

## 2020-01-26 DIAGNOSIS — R945 Abnormal results of liver function studies: Secondary | ICD-10-CM | POA: Diagnosis not present

## 2020-01-26 DIAGNOSIS — G629 Polyneuropathy, unspecified: Secondary | ICD-10-CM | POA: Diagnosis not present

## 2020-01-26 DIAGNOSIS — R7989 Other specified abnormal findings of blood chemistry: Secondary | ICD-10-CM

## 2020-01-26 LAB — URINALYSIS, ROUTINE W REFLEX MICROSCOPIC
Bilirubin Urine: NEGATIVE
Glucose, UA: >=500 mg/dL — AB
Hgb urine dipstick: NEGATIVE
Ketones, ur: NEGATIVE mg/dL
Nitrite: NEGATIVE
Protein, ur: NEGATIVE mg/dL
Specific Gravity, Urine: 1.025 (ref 1.005–1.030)
pH: 5 (ref 5.0–8.0)

## 2020-01-26 LAB — CBC WITH DIFFERENTIAL/PLATELET
Abs Immature Granulocytes: 0.01 10*3/uL (ref 0.00–0.07)
Basophils Absolute: 0 10*3/uL (ref 0.0–0.1)
Basophils Relative: 1 %
Eosinophils Absolute: 0.1 10*3/uL (ref 0.0–0.5)
Eosinophils Relative: 1 %
HCT: 37.6 % — ABNORMAL LOW (ref 39.0–52.0)
Hemoglobin: 13.3 g/dL (ref 13.0–17.0)
Immature Granulocytes: 0 %
Lymphocytes Relative: 60 %
Lymphs Abs: 3.1 10*3/uL (ref 0.7–4.0)
MCH: 35 pg — ABNORMAL HIGH (ref 26.0–34.0)
MCHC: 35.4 g/dL (ref 30.0–36.0)
MCV: 98.9 fL (ref 80.0–100.0)
Monocytes Absolute: 0.4 10*3/uL (ref 0.1–1.0)
Monocytes Relative: 8 %
Neutro Abs: 1.6 10*3/uL — ABNORMAL LOW (ref 1.7–7.7)
Neutrophils Relative %: 30 %
Platelets: 164 10*3/uL (ref 150–400)
RBC: 3.8 MIL/uL — ABNORMAL LOW (ref 4.22–5.81)
RDW: 12.8 % (ref 11.5–15.5)
WBC: 5.3 10*3/uL (ref 4.0–10.5)
nRBC: 0 % (ref 0.0–0.2)

## 2020-01-26 LAB — COMPREHENSIVE METABOLIC PANEL
ALT: 77 U/L — ABNORMAL HIGH (ref 0–44)
AST: 88 U/L — ABNORMAL HIGH (ref 15–41)
Albumin: 3.9 g/dL (ref 3.5–5.0)
Alkaline Phosphatase: 356 U/L — ABNORMAL HIGH (ref 38–126)
Anion gap: 11 (ref 5–15)
BUN: 12 mg/dL (ref 6–20)
CO2: 23 mmol/L (ref 22–32)
Calcium: 9.4 mg/dL (ref 8.9–10.3)
Chloride: 103 mmol/L (ref 98–111)
Creatinine, Ser: 0.75 mg/dL (ref 0.61–1.24)
GFR calc Af Amer: >60 mL/min (ref >60)
GFR calc non Af Amer: >60 mL/min (ref >60)
Glucose, Bld: 243 mg/dL — ABNORMAL HIGH (ref 70–99)
Potassium: 4.2 mmol/L (ref 3.5–5.1)
Sodium: 137 mmol/L (ref 135–145)
Total Bilirubin: 0.6 mg/dL (ref 0.3–1.2)
Total Protein: 7.7 g/dL (ref 6.5–8.1)

## 2020-01-26 LAB — CK: Total CK: 141 U/L (ref 49–397)

## 2020-01-26 MED ORDER — FENTANYL CITRATE (PF) 100 MCG/2ML IJ SOLN
75.0000 ug | Freq: Once | INTRAMUSCULAR | Status: AC
  Administered 2020-01-26: 03:00:00 75 ug via INTRAVENOUS
  Filled 2020-01-26: qty 2

## 2020-01-26 NOTE — ED Notes (Signed)
Pt. Documented in error see above note in chart. 

## 2020-01-26 NOTE — Discharge Instructions (Addendum)
Thank you for allowing me to care for you today in the Emergency Department.   You were seen today for more frequent falls over the last few weeks.  Your work-up for falls was negative.  You did not have any broken bones.  Your blood work did not demonstrate any source of why you have been falling.  I suspect that your worsening numbness is due to diabetic neuropathy.  Alcohol use could also be playing a part in this and can also contribute to feeling off balance or worsening numbness.  I would recommend alcohol cessation.  However, you should not do this on your own.  I have attached resources that can help you if you wish to quit drinking alcohol.  You did have one blood level that was elevated today, your alkaline phosphatase.  This has been elevated previously.  Your primary care is already working this up.  Please follow-up with them regarding your ER visit today.  You can take Tylenol or ibuprofen for pain.  Do not take Tylenol if you are drinking alcohol.  Return to the emergency department if you fall and hit your head, pass out, develop chest pain, new weakness, uncontrollable vomiting, or other new, concerning symptoms.

## 2020-01-26 NOTE — Patient Instructions (Signed)
Td (Tetanus, Diphtheria) Vaccine: What You Need to Know 1. Why get vaccinated? Td vaccine can prevent tetanus and diphtheria. Tetanus enters the body through cuts or wounds. Diphtheria spreads from person to person.  TETANUS (T) causes painful stiffening of the muscles. Tetanus can lead to serious health problems, including being unable to open the mouth, having trouble swallowing and breathing, or death.  DIPHTHERIA (D) can lead to difficulty breathing, heart failure, paralysis, or death. 2. Td vaccine Td is only for children 7 years and older, adolescents, and adults.  Td is usually given as a booster dose every 10 years, but it can also be given earlier after a severe and dirty wound or burn. Another vaccine, called Tdap, that protects against pertussis, also known as "whooping cough," in addition to tetanus and diphtheria, may be used instead of Td.  Td may be given at the same time as other vaccines. 3. Talk with your health care provider Tell your vaccine provider if the person getting the vaccine:  Has had an allergic reaction after a previous dose of any vaccine that protects against tetanus or diphtheria, or has any severe, life-threatening allergies.  Has ever had Guillain-Barr Syndrome (also called GBS).  Has had severe pain or swelling after a previous dose of any vaccine that protects against tetanus or diphtheria. In some cases, your health care provider may decide to postpone Td vaccination to a future visit.  People with minor illnesses, such as a cold, may be vaccinated. People who are moderately or severely ill should usually wait until they recover before getting Td vaccine.  Your health care provider can give you more information. 4. Risks of a vaccine reaction  Pain, redness, or swelling where the shot was given, mild fever, headache, feeling tired, and nausea, vomiting, diarrhea, or stomachache sometimes happen after Td vaccine. People sometimes faint after medical  procedures, including vaccination. Tell your provider if you feel dizzy or have vision changes or ringing in the ears.  As with any medicine, there is a very remote chance of a vaccine causing a severe allergic reaction, other serious injury, or death. 5. What if there is a serious problem? An allergic reaction could occur after the vaccinated person leaves the clinic. If you see signs of a severe allergic reaction (hives, swelling of the face and throat, difficulty breathing, a fast heartbeat, dizziness, or weakness), call 9-1-1 and get the person to the nearest hospital.  For other signs that concern you, call your health care provider.  Adverse reactions should be reported to the Vaccine Adverse Event Reporting System (VAERS). Your health care provider will usually file this report, or you can do it yourself. Visit the VAERS website at www.vaers.hhs.gov or call 1-800-822-7967. VAERS is only for reporting reactions, and VAERS staff do not give medical advice. 6. The National Vaccine Injury Compensation Program The National Vaccine Injury Compensation Program (VICP) is a federal program that was created to compensate people who may have been injured by certain vaccines. Visit the VICP website at www.hrsa.gov/vaccinecompensation or call 1-800-338-2382 to learn about the program and about filing a claim. There is a time limit to file a claim for compensation. 7. How can I learn more?  Ask your health care provider.  Call your local or state health department.  Contact the Centers for Disease Control and Prevention (CDC): ? Call 1-800-232-4636 (1-800-CDC-INFO) or ? Visit CDC's website at www.cdc.gov/vaccines Vaccine Information Statement Td Vaccine (03/30/19) This information is not intended to replace advice given   to you by your health care provider. Make sure you discuss any questions you have with your health care provider. Document Revised: 05/09/2019 Document Reviewed: 04/11/2019 Elsevier  Patient Education  2020 Elsevier Inc.  

## 2020-01-26 NOTE — ED Provider Notes (Signed)
[facility] DEPT Provider Note   CSN: [id number] Arrival date & time: 01/26/20  0051     History Chief Complaint  Patient presents with  . Weakness    Jenna Hamre is a 59 y.o. male with a history of DVT, hypothyroidism, diabetes mellitus type 2, diabetic neuropathy, alcohol use disorder, HTN, and sick sinus syndrome s/p AICD who presents to the emergency department with a chief complaint of frequent falls.  The patient endorses 4 falls over the last few weeks.  He is a poor historian and is unable to give details specifically about the falls.  He does note that he has not hit his head and denies syncope.  He had no nausea or vomiting after falls.  He is unsure if he was drinking alcohol at the time of the falls.  He reports that since he has been falling that he has been having severe pain in his neck that radiates down his back into his bilateral legs.  He states that his bilateral knees have been feeling weak.  He also notes numbness in his bilateral hands and feet.  Reports that the numbness in the bilateral feet has been present for some time and he was told that he may have diabetic neuropathy.  He is unsure when the bilateral numbness in his hands began.  He reports that he has been able to ambulate since the severe pain in his back and neck began.  States that his bed and his bathroom are only a few steps apart.  He is able to hold onto the wall and ambulate from one to the other.  He denies recent fevers, chills, chest pain, shortness of breath, cough, abdominal pain, nausea, vomiting, diarrhea, dizziness, lightheadedness, headaches, visual changes, dysuria, urinary or fecal incontinence.  The patient states "I love beer and will drink it every chance I get."  He is unable to quantify how much he drinks, but states that it is on a daily basis.  When asked how he was obtaining alcohol since he previously stated that he has not left his motel room in  months, he states "I have my associates pick it up for me. I wouldn't call them friends- they are associates" he is a current, every day smoker and endorses smoking cannabis, but denies other illicit or recreational drug use.  His AICD was last checked in November.  Per chart review, the patient was seen by his primary care provider via a telemedicine visit on 01/14 for numbness in his hands, weakness from his hip downs, and feeling "wobbly" when he gets out of bed.  He has been worked up for a chronically elevated alkaline phosphatase.  He was also seen by primary care on October 26 and was noted to have bilateral knee pain and low back pain at that time.  The history is provided by the patient. No language interpreter was used.       Past Medical History:  Diagnosis Date  . Acid reflux   . Acne rosacea   . Diabetes mellitus without complication ([place]) Dx 2637  . DVT (deep venous thrombosis) ([place]) 2012 and 2013    Left and Right leg, no history of PE    . Hyperlipidemia Dx 2010  . Hypertension Dx 2010  . Mood disorder ([place])   . Plantar fasciitis   . Substance abuse ([place])    last alchohol intake 01/24/2013  . Thyroid disease     Patient Active Problem List   Diagnosis  Date Noted  . Abnormal LFTs 01/15/2018  . Alcohol use disorder, mild, abuse 01/15/2018  . Insomnia 05/21/2017  . Sleep apnea 04/21/2017  . Esophageal dysphagia 02/01/2017  . Pain in joint of left wrist 04/29/2016  . Uncontrolled type 2 diabetes mellitus ([place]) 03/04/2016  . Chronic low back pain 03/04/2016  . Diabetic peripheral neuropathy associated with type 2 diabetes mellitus ([place]) 03/04/2016  . Sinus node dysfunction ([facility])   . Hyponatremia 02/18/2016  . Elevated lactic acid level 02/18/2016  . Depression 11/16/2014  . Atrial fibrillation ([place]) 10/05/2014  . Hypertension 09/25/2014  . Personal history of DVT (deep vein thrombosis) 09/25/2014  . Pacemaker 09/25/2014  . Hypothyroidism 09/25/2014    Past  Surgical History:  Procedure Laterality Date  . PACEMAKER INSERTION  2014  . TEE WITHOUT CARDIOVERSION N/A 02/22/2016   Procedure: TRANSESOPHAGEAL ECHOCARDIOGRAM (TEE);  Surgeon: Skeet Latch, MD;  Location: [facility] ENDOSCOPY;  Service: Cardiovascular;  Laterality: N/A;       Family History  Problem Relation Age of Onset  . Diabetes Mother   . Diabetes Father   . Diabetes Sister     Social History   Tobacco Use  . Smoking status: Current Every Day Smoker    Packs/day: 0.25    Types: Cigarettes  . Smokeless tobacco: Never Used  Substance Use Topics  . Alcohol use: Yes    Alcohol/week: 0.0 standard drinks    Comment: only on weekends  . Drug use: Yes    Types: Marijuana    Home Medications Prior to Admission medications   Medication Sig Start Date End Date Taking? Authorizing Provider  DULoxetine (CYMBALTA) 20 MG capsule Take 1 capsule (20 mg total) by mouth daily. For depression 01/18/20  Yes Ladell Pier, MD  folic acid (FOLVITE) 1 MG tablet Take 1 tablet (1 mg total) by mouth daily. 02/09/19  Yes Ladell Pier, MD  gabapentin (NEURONTIN) 300 MG capsule Take 1 capsule (300 mg total) by mouth 2 (two) times daily. 11/15/19  Yes Ladell Pier, MD  Insulin Glargine (LANTUS SOLOSTAR) 100 UNIT/ML Solostar Pen Inject 15 Units into the skin daily at 10 pm. 11/14/19  Yes Ladell Pier, MD  levothyroxine (SYNTHROID) 50 MCG tablet TAKE ONE TABLET BY MOUTH DAILY BEFORE BREAKFAST Patient taking differently: Take 50 mcg by mouth daily before breakfast.  01/24/20  Yes Ladell Pier, MD  lisinopril (ZESTRIL) 5 MG tablet Take 1 tablet (5 mg total) by mouth daily. 10/26/19  Yes Ladell Pier, MD  loratadine (CLARITIN) 10 MG tablet Take 1 tablet (10 mg total) by mouth daily. 11/23/19  Yes Ladell Pier, MD  meloxicam (MOBIC) 15 MG tablet Take 1 tablet (15 mg total) by mouth daily. 10/31/19  Yes Ladell Pier, MD  omeprazole (PRILOSEC) 20 MG capsule Take 1  capsule (20 mg total) by mouth daily. 11/14/19  Yes Ladell Pier, MD  XARELTO 20 MG TABS tablet Take 20 mg by mouth daily with supper.  06/24/19  Yes [provider]  ACCU-CHEK AVIVA PLUS test strip Use as instructed 3 times daily. Check blood sugar fasting and before meals and again if pt feels bad (symptoms of hypo). 10/31/19   Ladell Pier, MD  Accu-Chek Softclix Lancets lancets USE AS DIRECTED FOUR TIMES DAILY 01/13/20   Ladell Pier, MD  Lancets Misc. (ACCU-CHEK SOFTCLIX LANCET DEV) KIT Check blood sugar fasting and before meals and again if pt feels bad (symptoms of hypo). 10/26/18   Karle Plumber  B, MD  Multiple Vitamin (MULTIVITAMIN WITH MINERALS) TABS tablet Take 1 tablet by mouth daily. Patient not taking: Reported on 06/22/2019 10/19/16   Elgergawy, Silver Huguenin, MD    Allergies    Penicillins, Bacitracin, and Septra [sulfamethoxazole-trimethoprim]  Review of Systems   Review of Systems  Constitutional: Negative for appetite change, diaphoresis and fever.  HENT: Negative for congestion and sore throat.   Eyes: Positive for visual disturbance.  Respiratory: Negative for shortness of breath and wheezing.   Cardiovascular: Negative for chest pain and palpitations.  Gastrointestinal: Negative for abdominal pain, blood in stool, diarrhea, nausea and vomiting.  Genitourinary: Negative for dysuria.  Musculoskeletal: Positive for arthralgias, back pain, gait problem, myalgias and neck pain. Negative for neck stiffness.  Skin: Negative for color change, rash and wound.  Allergic/Immunologic: Negative for immunocompromised state.  Neurological: Positive for numbness. Negative for dizziness, seizures, syncope, weakness and headaches.  Psychiatric/Behavioral: Negative for confusion.    Physical Exam Updated Vital Signs BP 137/79   Pulse 75   Temp 98.4 F (36.9 C) (Oral)   Resp 16   SpO2 100%   Physical Exam Vitals and nursing note reviewed.  Constitutional:       Appearance: He is well-developed. He is not ill-appearing or toxic-appearing.     Comments: Body habitus with thin arms and legs and obese abdomen  HENT:     Head: Normocephalic.  Eyes:     Conjunctiva/sclera: Conjunctivae normal.  Cardiovascular:     Rate and Rhythm: Normal rate and regular rhythm.     Pulses: Normal pulses.     Heart sounds: Normal heart sounds. No murmur. No friction rub. No gallop.      Comments: AICD in place.  Radial and DP pulses are 2+ and symmetric. Pulmonary:     Effort: Pulmonary effort is normal. No respiratory distress.     Breath sounds: No stridor. No wheezing, rhonchi or rales.  Chest:     Chest wall: No tenderness.  Abdominal:     General: There is no distension.     Palpations: Abdomen is soft.  Musculoskeletal:        General: Tenderness present.     Cervical back: Neck supple.     Right lower leg: No edema.     Left lower leg: No edema.     Comments: Diffusely tender to palpation to the musculature of the thoracic, lumbar, and cervical spine.  No midline tenderness to the spinous processes of the cervical, thoracic, or lumbar spine.  He appears to have some scoliosis.  No crepitus or step-offs of the spine.  Full active and passive range of motion of the bilateral hips, knees, and ankles.  He is diffusely tender to all joints, but there is no focal tenderness.  No overlying redness or warmth.  No tenderness to the bilateral shoulders, elbows, wrist.  Skin:    General: Skin is warm and dry.  Neurological:     Mental Status: He is alert.     Comments: Decreased sensation bilaterally throughout the bilateral feet to the distal third of the bilateral lower legs.  Decreased sensation bilaterally to the bilateral hands.  No other focal neurologic deficits.  Patient is able to stand.  He is able to ambulate throughout the room while holding onto the wall.  Gait is stiff, but there is no ataxia noted.  He does not endorse dizziness or  lightheadedness with standing or with ambulation.  Psychiatric:  Behavior: Behavior normal.     ED Results / Procedures / Treatments   Labs (all labs ordered are listed, but only abnormal results are displayed) Labs Reviewed  CBC WITH DIFFERENTIAL/PLATELET - Abnormal; Notable for the following components:      Result Value   RBC 3.80 (*)    HCT 37.6 (*)    MCH 35.0 (*)    Neutro Abs 1.6 (*)    All other components within normal limits  COMPREHENSIVE METABOLIC PANEL - Abnormal; Notable for the following components:   Glucose, Bld 243 (*)    AST 88 (*)    ALT 77 (*)    Alkaline Phosphatase 356 (*)    All other components within normal limits  URINALYSIS, ROUTINE W REFLEX MICROSCOPIC - Abnormal; Notable for the following components:   Glucose, UA >=500 (*)    Leukocytes,Ua TRACE (*)    Bacteria, UA RARE (*)    All other components within normal limits  CK    EKG None  Radiology DG Thoracic Spine 2 View  Result Date: 01/26/2020 CLINICAL DATA:  No known injury.  Back pain EXAM: THORACIC SPINE 2 VIEWS COMPARISON:  02/18/2016 FINDINGS: Normal alignment. Stable mild degenerative changes. Stable mild compression deformities through the superior endplates of T5 and T7. No acute bony abnormality. Normal alignment. IMPRESSION: Stable mild compression deformities at T5 and T7. No acute bony abnormality. Electronically Signed   By: Rolm Baptise M.D.   On: 01/26/2020 03:38   DG Lumbar Spine Complete  Result Date: 01/26/2020 CLINICAL DATA:  Low back pain EXAM: LUMBAR SPINE - COMPLETE 4+ VIEW COMPARISON:  02/18/2016 FINDINGS: Degenerative facet disease throughout the lumbar spine. Normal alignment. Disc spaces maintained. IMPRESSION: Degenerative facet disease.  No acute bony abnormality. Electronically Signed   By: Rolm Baptise M.D.   On: 01/26/2020 03:39   CT Head Wo Contrast  Result Date: 01/26/2020 CLINICAL DATA:  Weakness with frequent falls EXAM: CT HEAD WITHOUT CONTRAST CT  CERVICAL SPINE WITHOUT CONTRAST TECHNIQUE: Multidetector CT imaging of the head and cervical spine was performed following the standard protocol without intravenous contrast. Multiplanar CT image reconstructions of the cervical spine were also generated. COMPARISON:  Head CT 10/17/2016 FINDINGS: CT HEAD FINDINGS Brain: No evidence of acute infarction, hemorrhage, hydrocephalus, extra-axial collection or mass lesion/mass effect. Chronic small vessel ischemia in the cerebral white matter, greater than expected for age. Vascular: No hyperdense vessel or unexpected calcification. Skull: Normal. Negative for fracture or focal lesion. Sinuses/Orbits: Negative CT CERVICAL SPINE FINDINGS Alignment: Physiologic Skull base and vertebrae: Negative for acute fracture. No evidence of bone lesion or erosion Soft tissues and spinal canal: Prominent carotid atherosclerotic calcification for age. Disc levels: Spurring extends inferiorly from the anterior arch of C1. Lower cervical disc narrowing and spondylosis. Upper chest: Negative IMPRESSION: 1. No evidence of intracranial or cervical spine injury. 2. Chronic small vessel ischemia Electronically Signed   By: Monte Fantasia M.D.   On: 01/26/2020 04:07   CT Cervical Spine Wo Contrast  Result Date: 01/26/2020 CLINICAL DATA:  Weakness with frequent falls EXAM: CT HEAD WITHOUT CONTRAST CT CERVICAL SPINE WITHOUT CONTRAST TECHNIQUE: Multidetector CT imaging of the head and cervical spine was performed following the standard protocol without intravenous contrast. Multiplanar CT image reconstructions of the cervical spine were also generated. COMPARISON:  Head CT 10/17/2016 FINDINGS: CT HEAD FINDINGS Brain: No evidence of acute infarction, hemorrhage, hydrocephalus, extra-axial collection or mass lesion/mass effect. Chronic small vessel ischemia in the cerebral white matter, greater than  expected for age. Vascular: No hyperdense vessel or unexpected calcification. Skull: Normal.  Negative for fracture or focal lesion. Sinuses/Orbits: Negative CT CERVICAL SPINE FINDINGS Alignment: Physiologic Skull base and vertebrae: Negative for acute fracture. No evidence of bone lesion or erosion Soft tissues and spinal canal: Prominent carotid atherosclerotic calcification for age. Disc levels: Spurring extends inferiorly from the anterior arch of C1. Lower cervical disc narrowing and spondylosis. Upper chest: Negative IMPRESSION: 1. No evidence of intracranial or cervical spine injury. 2. Chronic small vessel ischemia Electronically Signed   By: Monte Fantasia M.D.   On: 01/26/2020 04:07    Procedures Procedures (including critical care time)  Medications Ordered in ED Medications  fentaNYL (SUBLIMAZE) injection 75 mcg (75 mcg Intravenous Given 01/26/20 0252)    ED Course  I have reviewed the triage vital signs and the nursing notes.  Pertinent labs & imaging results that were available during my care of the patient were reviewed by me and considered in my medical decision making (see chart for details).    MDM Rules/Calculators/A&P                      59 year old male with a history of DVT, hypothyroidism, diabetes mellitus type 2, diabetic neuropathy, alcohol use disorder, HTN, and sick sinus syndrome s/p AICD presenting with frequent falls over the last few weeks and worsening back and neck pain radiating into his bilateral legs.  He also endorses numbness to the bilateral hands and feet.  Vital signs are normal.  He is nontoxic-appearing.  Imaging regarding the patient's falls has been unremarkable.  He does have chronic degenerative changes noted throughout.  No evidence of CVA.  Doubt cauda equina.  He does have decreased sensation bilaterally to the bilateral feet and distal third of the lower legs as well as the bilateral hands.  Given the stocking glove distribution, strongly suspect that this is related to his diabetes.  He does appear to have a documented history of  diabetic neuropathy.  The patient does endorse frequent alcohol use, particularly beer.  Question of worsening neuropathy may be secondary to alcohol use.  This would also explain the patient's body habitus.  Since he is endorsing muscular weakness, will order basic labs to assess for beerpotoania/hyponatremia.  Sodium level is normal.  His alkaline phosphatase is again elevated at 356, but improved from October.  His AST and ALT are elevated at 88 and 77 respectively.  This is increased from October, but he has previously had transaminases this elevated.  He has no abdominal tenderness on exam.  I do not feel that dedicated abdominal imaging is emergently indicated.  I have also reviewed dedicated abdominal imaging in the patient's medical record over the last few years, including MRCP. UA does not appear infectious. At this time, I do not see an acute etiology of the patient's falls.  He has been ambulatory in the ER.  At this time, I feel that no further urgent or emergent work-up is indicated.  He has good follow-up with primary care.  He can be discharged home with OTC pain medication and close follow-up with primary care.  He is hemodynamically stable and in no acute distress.  Safe for discharge home with outpatient follow-up as indicated.  Final Clinical Impression(s) / ED Diagnoses Final diagnoses:  Elevated alkaline phosphatase level  Neuropathy  Alcohol use disorder, severe, dependence ([place])    Rx / DC Orders ED Discharge Orders    None  Joanne Gavel, PA-C 01/26/20 [place], [place], MD 02/03/20 [phone]

## 2020-01-26 NOTE — Progress Notes (Signed)
Pt states his pain is coming from neck, lower back, right armpit, b/l knee and bl ankle and feet

## 2020-01-26 NOTE — Progress Notes (Signed)
Virtual Visit via Telephone Note Due to current restrictions/limitations of in-office visits due to the COVID-19 pandemic, this scheduled clinical appointment was converted to a telehealth visit  I connected with Patrick Small on 01/26/20 at 4:04 pm by telephone and verified that I am speaking with the correct person using two identifiers. I am in my office.  The patient is at home.  Only the patient and myself participated in this encounter.  I discussed the limitations, risks, security and privacy concerns of performing an evaluation and management service by telephone and the availability of in person appointments. I also discussed with the patient that there may be a patient responsible charge related to this service. The patient expressed understanding and agreed to proceed.   History of Present Illness: Pt with hx of DM with polyneuropathy, HTN, hypothyroid, DVT LEs, chronic LBP,med noncompliance,insomnia, ETOH use disorderand pace maker, noncompliance with meds.   Last eval 2 wks ago by PA.   Patient was eval via tele visit by the PA earlier this month complaining of weakness in his lower extremities. -Seen in the emergency room today with complaints of 4 falls over the past few weeks.  He attributes this to his knees feeling weak and not picking his feet up enough when he walks.Marland Kitchen  He also has numbness in his hands and feet.  The numbness in the feet has been present for some time.  He is taking the gabapentin.  He was having some pain in his neck and back since his fall.  I had ordered x-rays of his knees and lumbar spine from last evaluation with me in October 2020.  However he never had those done. -CT of the head done in the ER revealed no evidence of intracranial or cervical spine injury.  X-ray of the lumbar spine revealed degenerative facet disease.  X-ray of the thoracic spine revealed stable mild compression deformities at T5 and T7. -He has a cane but he has not been using  it. -Patient admits that he still drinks.  He tells me he is drinking 2-3  12 oz beers a day -Reports compliance with taking his thyroid medication.  TSH done in October was normal.  Of note on blood work from last visit in October he had mild elevation in AST and ALT and elevation of alk phos in the 400s. Outpatient Encounter Medications as of 01/26/2020  Medication Sig  . ACCU-CHEK AVIVA PLUS test strip Use as instructed 3 times daily. Check blood sugar fasting and before meals and again if pt feels bad (symptoms of hypo).  . Accu-Chek Softclix Lancets lancets USE AS DIRECTED FOUR TIMES DAILY  . DULoxetine (CYMBALTA) 20 MG capsule Take 1 capsule (20 mg total) by mouth daily. For depression  . folic acid (FOLVITE) 1 MG tablet Take 1 tablet (1 mg total) by mouth daily.  Marland Kitchen gabapentin (NEURONTIN) 300 MG capsule Take 1 capsule (300 mg total) by mouth 2 (two) times daily.  . Insulin Glargine (LANTUS SOLOSTAR) 100 UNIT/ML Solostar Pen Inject 15 Units into the skin daily at 10 pm.  . Lancets Misc. (ACCU-CHEK SOFTCLIX LANCET DEV) KIT Check blood sugar fasting and before meals and again if pt feels bad (symptoms of hypo).  Marland Kitchen levothyroxine (SYNTHROID) 50 MCG tablet TAKE ONE TABLET BY MOUTH DAILY BEFORE BREAKFAST (Patient taking differently: Take 50 mcg by mouth daily before breakfast. )  . lisinopril (ZESTRIL) 5 MG tablet Take 1 tablet (5 mg total) by mouth daily.  Marland Kitchen loratadine (CLARITIN) 10 MG  tablet Take 1 tablet (10 mg total) by mouth daily.  . meloxicam (MOBIC) 15 MG tablet Take 1 tablet (15 mg total) by mouth daily.  . Multiple Vitamin (MULTIVITAMIN WITH MINERALS) TABS tablet Take 1 tablet by mouth daily. (Patient not taking: Reported on 06/22/2019)  . omeprazole (PRILOSEC) 20 MG capsule Take 1 capsule (20 mg total) by mouth daily.  Alveda Reasons 20 MG TABS tablet Take 20 mg by mouth daily with supper.    No facility-administered encounter medications on file as of 01/26/2020.     Observations/Objective: Results for orders placed or performed during the hospital encounter of 01/26/20  CBC with Differential  Result Value Ref Range   WBC 5.3 4.0 - 10.5 K/uL   RBC 3.80 (L) 4.22 - 5.81 MIL/uL   Hemoglobin 13.3 13.0 - 17.0 g/dL   HCT 37.6 (L) 39.0 - 52.0 %   MCV 98.9 80.0 - 100.0 fL   MCH 35.0 (H) 26.0 - 34.0 pg   MCHC 35.4 30.0 - 36.0 g/dL   RDW 12.8 11.5 - 15.5 %   Platelets 164 150 - 400 K/uL   nRBC 0.0 0.0 - 0.2 %   Neutrophils Relative % 30 %   Neutro Abs 1.6 (L) 1.7 - 7.7 K/uL   Lymphocytes Relative 60 %   Lymphs Abs 3.1 0.7 - 4.0 K/uL   Monocytes Relative 8 %   Monocytes Absolute 0.4 0.1 - 1.0 K/uL   Eosinophils Relative 1 %   Eosinophils Absolute 0.1 0.0 - 0.5 K/uL   Basophils Relative 1 %   Basophils Absolute 0.0 0.0 - 0.1 K/uL   Immature Granulocytes 0 %   Abs Immature Granulocytes 0.01 0.00 - 0.07 K/uL  CK  Result Value Ref Range   Total CK 141 49 - 397 U/L  Comprehensive metabolic panel  Result Value Ref Range   Sodium 137 135 - 145 mmol/L   Potassium 4.2 3.5 - 5.1 mmol/L   Chloride 103 98 - 111 mmol/L   CO2 23 22 - 32 mmol/L   Glucose, Bld 243 (H) 70 - 99 mg/dL   BUN 12 6 - 20 mg/dL   Creatinine, Ser 0.75 0.61 - 1.24 mg/dL   Calcium 9.4 8.9 - 10.3 mg/dL   Total Protein 7.7 6.5 - 8.1 g/dL   Albumin 3.9 3.5 - 5.0 g/dL   AST 88 (H) 15 - 41 U/L   ALT 77 (H) 0 - 44 U/L   Alkaline Phosphatase 356 (H) 38 - 126 U/L   Total Bilirubin 0.6 0.3 - 1.2 mg/dL   GFR calc non Af Amer >60 >60 mL/min   GFR calc Af Amer >60 >60 mL/min   Anion gap 11 5 - 15  Urinalysis, Routine w reflex microscopic  Result Value Ref Range   Color, Urine YELLOW YELLOW   APPearance CLEAR CLEAR   Specific Gravity, Urine 1.025 1.005 - 1.030   pH 5.0 5.0 - 8.0   Glucose, UA >=500 (A) NEGATIVE mg/dL   Hgb urine dipstick NEGATIVE NEGATIVE   Bilirubin Urine NEGATIVE NEGATIVE   Ketones, ur NEGATIVE NEGATIVE mg/dL   Protein, ur NEGATIVE NEGATIVE mg/dL   Nitrite  NEGATIVE NEGATIVE   Leukocytes,Ua TRACE (A) NEGATIVE   RBC / HPF 0-5 0 - 5 RBC/hpf   WBC, UA 11-20 0 - 5 WBC/hpf   Bacteria, UA RARE (A) NONE SEEN   Squamous Epithelial / LPF 0-5 0 - 5   Mucus PRESENT      Assessment and Plan: 1.  History of recent fall -I think this may be due to a combination of diabetic polyneuropathy and probably osteoarthritis of the knees.  I will check a B12 level.  I would like for him to get the x-rays done on the knee.s He will continue gabapentin. I referred him for some physical therapy. We will try to get him in to see me in person within the next few weeks - Ambulatory referral to Physical Therapy  2. Alcohol use disorder, mild, abuse Advised him to cut back even more better yet to quit.  3. Weakness See #1 above  4. Abnormal LFTs -Given the elevated alk phos, will check mitochondrial antibody, parathyroid hormone and vitamin D level.   Follow Up Instructions: 2 wks   I discussed the assessment and treatment plan with the patient. The patient was provided an opportunity to ask questions and all were answered. The patient agreed with the plan and demonstrated an understanding of the instructions.   The patient was advised to call back or seek an in-person evaluation if the symptoms worsen or if the condition fails to improve as anticipated.  I provided 15 minutes of non-face-to-face time during this encounter.   Karle Plumber, MD

## 2020-01-26 NOTE — ED Triage Notes (Signed)
Patient arrived via [facility] stating he has had generalized weakness over the last few days and frequent falls. Complaints of right neck and shoulder pain. Ambulatory with EMS.

## 2020-01-26 NOTE — ED Notes (Addendum)
Patient given D/C paperwork and D/C instructions explained, patient refused to get dressed, and does not want to take the bus because he is wearing slides. Patient given socks and explained that he can take the bus or he can call a cab. Patient did not like either of these options, refused to get dressed and stated that I should call the police, off-duty [employer] in room talking to patient. Patient refused to sign D/C pad.

## 2020-02-02 ENCOUNTER — Ambulatory Visit: Payer: Medicaid Other | Admitting: Pharmacist

## 2020-02-03 ENCOUNTER — Other Ambulatory Visit: Payer: Self-pay | Admitting: Internal Medicine

## 2020-02-03 DIAGNOSIS — R1310 Dysphagia, unspecified: Secondary | ICD-10-CM

## 2020-02-10 ENCOUNTER — Ambulatory Visit: Payer: Medicaid Other | Attending: Internal Medicine | Admitting: Physical Therapy

## 2020-02-16 ENCOUNTER — Other Ambulatory Visit: Payer: Self-pay

## 2020-02-16 ENCOUNTER — Ambulatory Visit: Payer: Medicaid Other | Attending: Internal Medicine | Admitting: Internal Medicine

## 2020-02-16 DIAGNOSIS — R7989 Other specified abnormal findings of blood chemistry: Secondary | ICD-10-CM

## 2020-02-16 DIAGNOSIS — M25562 Pain in left knee: Secondary | ICD-10-CM

## 2020-02-16 DIAGNOSIS — F1020 Alcohol dependence, uncomplicated: Secondary | ICD-10-CM

## 2020-02-16 DIAGNOSIS — R202 Paresthesia of skin: Secondary | ICD-10-CM

## 2020-02-16 DIAGNOSIS — E1142 Type 2 diabetes mellitus with diabetic polyneuropathy: Secondary | ICD-10-CM | POA: Diagnosis not present

## 2020-02-16 DIAGNOSIS — R200 Anesthesia of skin: Secondary | ICD-10-CM

## 2020-02-16 DIAGNOSIS — I10 Essential (primary) hypertension: Secondary | ICD-10-CM | POA: Diagnosis not present

## 2020-02-16 DIAGNOSIS — M25561 Pain in right knee: Secondary | ICD-10-CM | POA: Diagnosis not present

## 2020-02-16 DIAGNOSIS — Z86718 Personal history of other venous thrombosis and embolism: Secondary | ICD-10-CM

## 2020-02-16 DIAGNOSIS — R945 Abnormal results of liver function studies: Secondary | ICD-10-CM

## 2020-02-16 DIAGNOSIS — G8929 Other chronic pain: Secondary | ICD-10-CM

## 2020-02-16 DIAGNOSIS — F5104 Psychophysiologic insomnia: Secondary | ICD-10-CM

## 2020-02-16 DIAGNOSIS — I495 Sick sinus syndrome: Secondary | ICD-10-CM

## 2020-02-16 DIAGNOSIS — E039 Hypothyroidism, unspecified: Secondary | ICD-10-CM

## 2020-02-16 NOTE — Progress Notes (Signed)
Pt states he is having numbness in his hands. Pt states he is unable to grip things   Pt states his legs gives out on him sometimes

## 2020-02-16 NOTE — Progress Notes (Signed)
Virtual Visit via Telephone Note This was supposed to be an in person visit but had to be changed to a telephone encounter due to icy conditions and the clinic being closed. Due to current restrictions/limitations of in-office visits due to the COVID-19 pandemic, this scheduled clinical appointment was converted to a telehealth visit  I connected with Patrick Small on 02/16/20 at 8:36 a.m EST by telephone and verified that I am speaking with the correct person using two identifiers. I am working from home.  The patient is at home.  Only the patient and myself participated in this encounter.  I discussed the limitations, risks, security and privacy concerns of performing an evaluation and management service by telephone and the availability of in person appointments. I also discussed with the patient that there may be a patient responsible charge related to this service. The patient expressed understanding and agreed to proceed.   History of Present Illness: Pt with hx of DM with polyneuropathy, HTN, hypothyroid, DVT LEs, chronic LBP,med noncompliance,insomnia, ETOH use disorderand pace maker, noncompliance with meds.   C/o that knees feel tight.  They are not swollen.   No further falls since last visit. Did not get knee x-rays done because he did not want to go to the hospital unless really necessary because of the Covid pandemic. Referred to P.T on last visit.  Per the referral message in the system, pt no showed the appt.  Pt reports that he received letter yesterday telling him he had appt on 02/10/2020 at 9:30 a.m.    ETOH:  "I don't drink like I use to but that's the only way I can get some sleep."  States he has tried everything and nothing works.  He has to sleep with TV on.  States he has done this for over 10 yrs.   DM:  Checks BS 2-3 x/daily.  Range 100-118 Taking 5 units of Lantus daily Doing okay with eating habits.  Wants to gain wgh.  No sweet drinks.   Walks a little "but  it is hard for me to walk as much as before because of my knees."  HTN: compliant with Lisinopril.  Limits salt in the foods.  He does his own cooking.  No CPs or SOB.   Still having numbness in his hands and feet.  States that he sometimes drops things when he is holding them.  We will plan to check a vitamin B12 level but he never came to the lab.  Sinus node dysfunction: Patient has pacemaker in place.  History of DVTs in the lower extremities: Patient is not taking Xarelto.  We had stopped it because he never really took it consistently.  Observations/Objective: No direct observation done as this was a telephone encounter.    Chemistry      Component Value Date/Time   NA 137 01/26/2020 0242   NA 133 (L) 10/24/2019 1619   K 4.2 01/26/2020 0242   CL 103 01/26/2020 0242   CO2 23 01/26/2020 0242   BUN 12 01/26/2020 0242   BUN 8 10/24/2019 1619   CREATININE 0.75 01/26/2020 0242   CREATININE 0.94 02/20/2017 1422      Component Value Date/Time   CALCIUM 9.4 01/26/2020 0242   ALKPHOS 356 (H) 01/26/2020 0242   AST 88 (H) 01/26/2020 0242   ALT 77 (H) 01/26/2020 0242   BILITOT 0.6 01/26/2020 0242   BILITOT 0.4 10/24/2019 1619      Assessment and Plan: 1. Chronic pain of both knees -  Likely due to osteoarthritis. Encouraged him to get the x-rays done as we do not have any previous x-rays of the knees.  Continue meloxicam.  Encouraged to try to walk as much as he is able to. -He plans to call the physical therapist to schedule an appointment.  He received the letter after the appointment date.  2. Numbness and tingling of upper and lower extremities of both sides Likely a combination of alcohol induced and diabetic neuropathy. Continue gabapentin. Check B12 level when he comes to the lab - Vitamin B12; Future  3. Diabetic peripheral neuropathy associated with type 2 diabetes mellitus ([place]) Reported blood sugars are at goal.  He will continue current dose of Lantus.  He is  currently taking only 5 units.  Encouraged him to continue trying to eat healthy - Hemoglobin A1c; Future  4. Essential hypertension Continue lisinopril and low-salt diet  5. Sinus node dysfunction ([facility]) No symptoms of dizziness.  He has a pacemaker in place.  6. Hypothyroidism, unspecified type Continue levothyroxine  7. Chronic insomnia Good sleep hygiene discussed and encouraged.  Encouraged him to turn the television off once he gets in bed.  Advised that it may take a while for him to get used to this given that he has slept for years with the TV on  8. Alcohol use disorder, moderate, dependence ([place]) Continue to encourage him to discontinue use of alcohol.  LFTs are abnormal and most likely due to alcohol use.  9. History of DVT of lower extremity Not on anticoagulation anymore as he was not taking it consistently  10. Abnormal LFTs - Hepatic Function Panel; Future - Gamma GT; Future - Mitochondrial Antibodies; Future   Follow Up Instructions: 2-3 mths   I discussed the assessment and treatment plan with the patient. The patient was provided an opportunity to ask questions and all were answered. The patient agreed with the plan and demonstrated an understanding of the instructions.   The patient was advised to call back or seek an in-person evaluation if the symptoms worsen or if the condition fails to improve as anticipated.  I provided 22 minutes of non-face-to-face time during this encounter.   Karle Plumber, MD

## 2020-02-29 ENCOUNTER — Ambulatory Visit: Payer: Medicaid Other | Attending: Internal Medicine | Admitting: Physical Therapy

## 2020-03-22 ENCOUNTER — Other Ambulatory Visit: Payer: Self-pay | Admitting: Internal Medicine

## 2020-04-03 ENCOUNTER — Other Ambulatory Visit: Payer: Self-pay | Admitting: Internal Medicine

## 2020-04-03 DIAGNOSIS — E1142 Type 2 diabetes mellitus with diabetic polyneuropathy: Secondary | ICD-10-CM

## 2020-04-16 ENCOUNTER — Other Ambulatory Visit: Payer: Self-pay | Admitting: Internal Medicine

## 2020-04-16 DIAGNOSIS — G8929 Other chronic pain: Secondary | ICD-10-CM

## 2020-05-10 ENCOUNTER — Other Ambulatory Visit: Payer: Self-pay | Admitting: Internal Medicine

## 2020-05-10 DIAGNOSIS — R0981 Nasal congestion: Secondary | ICD-10-CM

## 2020-05-17 ENCOUNTER — Ambulatory Visit: Payer: Medicaid Other | Admitting: Internal Medicine

## 2020-05-21 ENCOUNTER — Other Ambulatory Visit: Payer: Self-pay | Admitting: Internal Medicine

## 2020-05-21 DIAGNOSIS — F32A Depression, unspecified: Secondary | ICD-10-CM

## 2020-05-21 NOTE — Telephone Encounter (Signed)
Please fill

## 2020-05-22 ENCOUNTER — Other Ambulatory Visit: Payer: Self-pay | Admitting: Internal Medicine

## 2020-05-22 DIAGNOSIS — R1310 Dysphagia, unspecified: Secondary | ICD-10-CM

## 2020-05-29 ENCOUNTER — Other Ambulatory Visit: Payer: Self-pay | Admitting: Internal Medicine

## 2020-05-29 DIAGNOSIS — E039 Hypothyroidism, unspecified: Secondary | ICD-10-CM

## 2020-05-29 DIAGNOSIS — E1142 Type 2 diabetes mellitus with diabetic polyneuropathy: Secondary | ICD-10-CM

## 2020-07-18 ENCOUNTER — Other Ambulatory Visit: Payer: Self-pay | Admitting: Internal Medicine

## 2020-07-18 DIAGNOSIS — R1310 Dysphagia, unspecified: Secondary | ICD-10-CM

## 2020-07-24 ENCOUNTER — Other Ambulatory Visit: Payer: Self-pay | Admitting: Internal Medicine

## 2020-07-24 DIAGNOSIS — R0981 Nasal congestion: Secondary | ICD-10-CM

## 2020-07-24 DIAGNOSIS — E039 Hypothyroidism, unspecified: Secondary | ICD-10-CM

## 2020-08-09 ENCOUNTER — Other Ambulatory Visit: Payer: Self-pay | Admitting: Internal Medicine

## 2020-08-09 DIAGNOSIS — F32A Depression, unspecified: Secondary | ICD-10-CM

## 2020-08-09 DIAGNOSIS — F329 Major depressive disorder, single episode, unspecified: Secondary | ICD-10-CM

## 2020-08-15 ENCOUNTER — Other Ambulatory Visit: Payer: Self-pay | Admitting: Internal Medicine

## 2020-08-21 ENCOUNTER — Other Ambulatory Visit: Payer: Self-pay | Admitting: Internal Medicine

## 2020-08-21 DIAGNOSIS — E1142 Type 2 diabetes mellitus with diabetic polyneuropathy: Secondary | ICD-10-CM

## 2020-08-21 NOTE — Telephone Encounter (Signed)
Requested Prescriptions  Pending Prescriptions Disp Refills  . gabapentin (NEURONTIN) 300 MG capsule [Pharmacy Med Name: gabapentin 300 mg capsule] 60 capsule 1    Sig: Take 1 capsule (300 mg total) by mouth 2 (two) times daily.     Neurology: Anticonvulsants - gabapentin Passed - 08/21/2020  2:32 PM      Passed - Valid encounter within last 12 months    Recent Outpatient Visits          6 months ago Chronic pain of both knees   [place] [facility] Marcine Matar, MD   6 months ago History of recent fall   [facility] Marcine Matar, MD   7 months ago Weakness   [facility] [place], Marylene Land M, [place]   10 months ago Diabetic peripheral neuropathy associated with type 2 diabetes mellitus [facility])   [facility] [facility] Marcine Matar, MD   1 year ago Diabetic peripheral neuropathy associated with type 2 diabetes mellitus [facility])   [place] [facility] Marcine Matar, MD      Future Appointments            In 1 month [place], Binnie Rail, MD [facility]            Phone call to pt.  Scheduled appt. For f/u (was due in May) on 10/18/20 with PCP.  Medication filled per protocol.

## 2020-09-05 ENCOUNTER — Other Ambulatory Visit: Payer: Self-pay | Admitting: Internal Medicine

## 2020-09-05 DIAGNOSIS — R1310 Dysphagia, unspecified: Secondary | ICD-10-CM

## 2020-09-05 DIAGNOSIS — E039 Hypothyroidism, unspecified: Secondary | ICD-10-CM

## 2020-09-05 NOTE — Telephone Encounter (Signed)
Appointment 10/18/20- RF per protocol

## 2020-09-17 ENCOUNTER — Other Ambulatory Visit: Payer: Self-pay | Admitting: Internal Medicine

## 2020-09-24 ENCOUNTER — Other Ambulatory Visit: Payer: Self-pay

## 2020-09-24 ENCOUNTER — Encounter (HOSPITAL_COMMUNITY): Payer: Self-pay | Admitting: *Deleted

## 2020-09-24 ENCOUNTER — Emergency Department (HOSPITAL_COMMUNITY)
Admission: EM | Admit: 2020-09-24 | Discharge: 2020-09-25 | Disposition: A | Discharge status: Home / Self Care | Payer: Medicaid Other | Attending: Emergency Medicine | Admitting: Emergency Medicine

## 2020-09-24 DIAGNOSIS — F17210 Nicotine dependence, cigarettes, uncomplicated: Secondary | ICD-10-CM | POA: Diagnosis not present

## 2020-09-24 DIAGNOSIS — E039 Hypothyroidism, unspecified: Secondary | ICD-10-CM | POA: Diagnosis not present

## 2020-09-24 DIAGNOSIS — I10 Essential (primary) hypertension: Secondary | ICD-10-CM | POA: Insufficient documentation

## 2020-09-24 DIAGNOSIS — Z79899 Other long term (current) drug therapy: Secondary | ICD-10-CM | POA: Insufficient documentation

## 2020-09-24 DIAGNOSIS — M19041 Primary osteoarthritis, right hand: Secondary | ICD-10-CM | POA: Insufficient documentation

## 2020-09-24 DIAGNOSIS — E119 Type 2 diabetes mellitus without complications: Secondary | ICD-10-CM | POA: Insufficient documentation

## 2020-09-24 DIAGNOSIS — M19042 Primary osteoarthritis, left hand: Secondary | ICD-10-CM | POA: Insufficient documentation

## 2020-09-24 DIAGNOSIS — R0789 Other chest pain: Secondary | ICD-10-CM | POA: Insufficient documentation

## 2020-09-24 DIAGNOSIS — R079 Chest pain, unspecified: Secondary | ICD-10-CM

## 2020-09-24 LAB — BASIC METABOLIC PANEL
Anion gap: 13 (ref 5–15)
BUN: 12 mg/dL (ref 6–20)
CO2: 22 mmol/L (ref 22–32)
Calcium: 9.2 mg/dL (ref 8.9–10.3)
Chloride: 98 mmol/L (ref 98–111)
Creatinine, Ser: 0.97 mg/dL (ref 0.61–1.24)
GFR calc Af Amer: >60 mL/min (ref >60)
GFR calc non Af Amer: >60 mL/min (ref >60)
Glucose, Bld: 235 mg/dL — ABNORMAL HIGH (ref 70–99)
Potassium: 4.2 mmol/L (ref 3.5–5.1)
Sodium: 133 mmol/L — ABNORMAL LOW (ref 135–145)

## 2020-09-24 LAB — CBC
HCT: 34 % — ABNORMAL LOW (ref 39.0–52.0)
Hemoglobin: 12 g/dL — ABNORMAL LOW (ref 13.0–17.0)
MCH: 34.9 pg — ABNORMAL HIGH (ref 26.0–34.0)
MCHC: 35.3 g/dL (ref 30.0–36.0)
MCV: 98.8 fL (ref 80.0–100.0)
Platelets: 151 10*3/uL (ref 150–400)
RBC: 3.44 MIL/uL — ABNORMAL LOW (ref 4.22–5.81)
RDW: 12.4 % (ref 11.5–15.5)
WBC: 3.6 10*3/uL — ABNORMAL LOW (ref 4.0–10.5)
nRBC: 0 % (ref 0.0–0.2)

## 2020-09-24 LAB — TROPONIN I (HIGH SENSITIVITY): Troponin I (High Sensitivity): 12 ng/L (ref <18)

## 2020-09-24 NOTE — ED Triage Notes (Signed)
Pt reports having pain all over, including his chest. ekg done. No acute distress done. reports doing "all types of drugs" and last used last night.

## 2020-09-25 ENCOUNTER — Emergency Department (HOSPITAL_COMMUNITY): Payer: Medicaid Other

## 2020-09-25 LAB — TROPONIN I (HIGH SENSITIVITY): Troponin I (High Sensitivity): 16 ng/L (ref <18)

## 2020-09-25 LAB — CK: Total CK: 243 U/L (ref 49–397)

## 2020-09-25 MED ORDER — KETOROLAC TROMETHAMINE 15 MG/ML IJ SOLN
15.0000 mg | Freq: Once | INTRAMUSCULAR | Status: AC
  Administered 2020-09-25: 15 mg via INTRAVENOUS
  Filled 2020-09-25: qty 1

## 2020-09-25 MED ORDER — SODIUM CHLORIDE 0.9 % IV BOLUS
1000.0000 mL | Freq: Once | INTRAVENOUS | Status: AC
  Administered 2020-09-25: 1000 mL via INTRAVENOUS

## 2020-09-25 NOTE — ED Provider Notes (Signed)
[facility] EMERGENCY DEPARTMENT Provider Note   CSN: [id number] Arrival date & time: 09/24/20  1710     History Chief Complaint  Patient presents with  . Chest Pain    Patrick Small is a 59 y.o. male.  HPI 59 year old male presents with chest pain, arm pain, and leg pain.  Started a couple days ago.  He abuses alcohol and cocaine.  Last used cocaine a couple days ago as well.  The pain in his chest and arms and legs are sharp.  He feels like he is having diffuse cramping.  No fever or significant cough.  No shortness of breath.  No swelling in his hands or legs.  No abdominal discomfort.  Past Medical History:  Diagnosis Date  . Acid reflux   . Acne rosacea   . Diabetes mellitus without complication ([place]) Dx 3875  . DVT (deep venous thrombosis) ([place]) 2012 and 2013    Left and Right leg, no history of PE    . Hyperlipidemia Dx 2010  . Hypertension Dx 2010  . Mood disorder ([place])   . Plantar fasciitis   . Substance abuse ([place])    last alchohol intake 01/24/2013  . Thyroid disease     Patient Active Problem List   Diagnosis Date Noted  . Abnormal LFTs 01/15/2018  . Alcohol use disorder, mild, abuse 01/15/2018  . Insomnia 05/21/2017  . Sleep apnea 04/21/2017  . Esophageal dysphagia 02/01/2017  . Pain in joint of left wrist 04/29/2016  . Uncontrolled type 2 diabetes mellitus ([place]) 03/04/2016  . Chronic low back pain 03/04/2016  . Diabetic peripheral neuropathy associated with type 2 diabetes mellitus ([place]) 03/04/2016  . Sinus node dysfunction (HCC)   . Hyponatremia 02/18/2016  . Elevated lactic acid level 02/18/2016  . Depression 11/16/2014  . Atrial fibrillation ([place]) 10/05/2014  . Hypertension 09/25/2014  . Personal history of DVT (deep vein thrombosis) 09/25/2014  . Pacemaker 09/25/2014  . Hypothyroidism 09/25/2014    Past Surgical History:  Procedure Laterality Date  . PACEMAKER INSERTION  2014  . TEE WITHOUT CARDIOVERSION N/A 02/22/2016     Procedure: TRANSESOPHAGEAL ECHOCARDIOGRAM (TEE);  Surgeon: Skeet Latch, MD;  Location: [facility] ENDOSCOPY;  Service: Cardiovascular;  Laterality: N/A;       Family History  Problem Relation Age of Onset  . Diabetes Mother   . Diabetes Father   . Diabetes Sister     Social History   Tobacco Use  . Smoking status: Current Every Day Smoker    Packs/day: 0.25    Types: Cigarettes  . Smokeless tobacco: Never Used  Substance Use Topics  . Alcohol use: Yes    Alcohol/week: 0.0 standard drinks    Comment: only on weekends  . Drug use: Yes    Types: Marijuana    Home Medications Prior to Admission medications   Medication Sig Start Date End Date Taking? Authorizing Provider  ACCU-CHEK AVIVA PLUS test strip Use as instructed 3 times daily. Check blood sugar fasting and before meals and again if pt feels bad (symptoms of hypo). 10/31/19   Ladell Pier, MD  Accu-Chek Softclix Lancets lancets USE AS DIRECTED FOUR TIMES DAILY 08/15/20   Ladell Pier, MD  DULoxetine (CYMBALTA) 20 MG capsule Take 1 capsule (20 mg total) by mouth daily. For depression 08/09/20   Ladell Pier, MD  folic acid (FOLVITE) 1 MG tablet Take 1 tablet (1 mg total) by mouth daily. 02/09/19   Ladell Pier, MD  gabapentin (  NEURONTIN) 300 MG capsule Take 1 capsule (300 mg total) by mouth 2 (two) times daily. 08/21/20   Marcine Matar, MD  Lancets Misc. (ACCU-CHEK SOFTCLIX LANCET DEV) KIT Check blood sugar fasting and before meals and again if pt feels bad (symptoms of hypo). 10/26/18   Marcine Matar, MD  LANTUS SOLOSTAR 100 UNIT/ML Solostar Pen Inject 15 Units into the skin daily at 10 pm. 05/29/20   Marcine Matar, MD  levothyroxine (SYNTHROID) 50 MCG tablet TAKE ONE TABLET BY MOUTH DAILY BEFORE BREAKFAST 09/05/20   Marcine Matar, MD  lisinopril (ZESTRIL) 5 MG tablet Take 1 tablet (5 mg total) by mouth daily. 10/26/19   Marcine Matar, MD  loratadine (CLARITIN) 10 MG tablet Take 1  tablet (10 mg total) by mouth daily. 07/24/20   Marcine Matar, MD  meloxicam (MOBIC) 15 MG tablet Take 1 tablet (15 mg total) by mouth daily. 04/18/20   Marcine Matar, MD  Multiple Vitamin (MULTIVITAMIN WITH MINERALS) TABS tablet Take 1 tablet by mouth daily. Patient not taking: Reported on 06/22/2019 10/19/16   Elgergawy, Leana Roe, MD  omeprazole (PRILOSEC) 20 MG capsule Take 1 capsule (20 mg total) by mouth daily. 09/05/20   Marcine Matar, MD    Allergies    Penicillins, Bacitracin, and Septra [sulfamethoxazole-trimethoprim]  Review of Systems   Review of Systems  Constitutional: Negative for fever.  Respiratory: Negative for cough.   Cardiovascular: Positive for chest pain. Negative for leg swelling.  Gastrointestinal: Negative for vomiting.  Musculoskeletal: Positive for myalgias. Negative for joint swelling.  All other systems reviewed and are negative.   Physical Exam Updated Vital Signs BP (!) 175/108   Pulse 82   Temp 98.3 F (36.8 C) (Oral)   Resp 18   SpO2 100%   Physical Exam Vitals and nursing note reviewed.  Constitutional:      General: He is not in acute distress.    Appearance: He is well-developed. He is not ill-appearing or diaphoretic.  HENT:     Head: Normocephalic and atraumatic.     Right Ear: External ear normal.     Left Ear: External ear normal.     Nose: Nose normal.  Eyes:     General:        Right eye: No discharge.        Left eye: No discharge.  Cardiovascular:     Rate and Rhythm: Normal rate and regular rhythm.     Heart sounds: Normal heart sounds.  Pulmonary:     Effort: Pulmonary effort is normal.     Breath sounds: Normal breath sounds.  Abdominal:     Palpations: Abdomen is soft.     Tenderness: There is no abdominal tenderness.  Musculoskeletal:     Cervical back: Neck supple.     Comments: No peripheral edema/calf tenderness. No swelling or erythema to bilateral hands. He is tender to light touch, but no one area  is more painful than the rest.  Skin:    General: Skin is warm and dry.  Neurological:     Mental Status: He is alert.  Psychiatric:        Mood and Affect: Mood is not anxious.     ED Results / Procedures / Treatments   Labs (all labs ordered are listed, but only abnormal results are displayed) Labs Reviewed  BASIC METABOLIC PANEL - Abnormal; Notable for the following components:      Result Value   Sodium  133 (*)    Glucose, Bld 235 (*)    All other components within normal limits  CBC - Abnormal; Notable for the following components:   WBC 3.6 (*)    RBC 3.44 (*)    Hemoglobin 12.0 (*)    HCT 34.0 (*)    MCH 34.9 (*)    All other components within normal limits  CK  TROPONIN I (HIGH SENSITIVITY)  TROPONIN I (HIGH SENSITIVITY)    EKG EKG Interpretation  Date/Time:  Monday September 24 2020 17:14:25 EDT Ventricular Rate:  86 PR Interval:  210 QRS Duration: 76 QT Interval:  370 QTC Calculation: 442 R Axis:   74 Text Interpretation: Sinus rhythm with 1st degree A-V block Anteroseptal infarct , age undetermined Abnormal ECG When compared with ECG of 02/15/2017, Sinus rhythm has replaced ATRIAL PACED RHYTHM Confirmed by Delora Fuel ([id number]) on 09/24/2020 11:32:09 PM   Radiology DG Chest 2 View  Result Date: 09/25/2020 CLINICAL DATA:  Chest pain EXAM: CHEST - 2 VIEW COMPARISON:  10/17/2016 FINDINGS: Cardiac pacemaker. Normal heart size and pulmonary vascularity. Lungs are clear. No pleural effusions or pneumothorax. Mediastinal contours appear intact. Vague soft tissue prominence over the left costophrenic angle probably represents a prominent nipple shadow. Degenerative changes in the spine. IMPRESSION: No active cardiopulmonary disease. Electronically Signed   By: Lucienne Capers M.D.   On: 09/25/2020 02:09   DG Hand Complete Left  Result Date: 09/25/2020 CLINICAL DATA:  Bilateral hand pain EXAM: LEFT HAND - COMPLETE 3+ VIEW; RIGHT HAND - COMPLETE 3+ VIEW COMPARISON:   None. FINDINGS: Right hand: No acute fracture. No dislocation. Mild-to-moderate degenerative changes of the first MCP joint. Mild joint space narrowing involving the radiocarpal, first CMC, and triscaphe joints. No discrete erosion. Soft tissues unremarkable. Left hand: No acute fracture. No dislocation. Mild-to-moderate degenerative changes of the first MCP joint with mild arthropathy at the IP joint of the thumb. There is a 7 mm metallic foreign body within the soft tissues of the left hand located between the second and third metacarpal diaphyses with a few adjacent tiny metallic fragments more volarly. No soft tissue swelling. IMPRESSION: 1. Mild-to-moderate degenerative changes of the bilateral hands, most pronounced at the first MCP joints. Findings most suggestive of osteoarthritis. 2. 7 mm metallic foreign body within the soft tissues of the left hand with a few adjacent tiny metallic fragments. Electronically Signed   By: Davina Poke D.O.   On: 09/25/2020 11:01   DG Hand Complete Right  Result Date: 09/25/2020 CLINICAL DATA:  Bilateral hand pain EXAM: LEFT HAND - COMPLETE 3+ VIEW; RIGHT HAND - COMPLETE 3+ VIEW COMPARISON:  None. FINDINGS: Right hand: No acute fracture. No dislocation. Mild-to-moderate degenerative changes of the first MCP joint. Mild joint space narrowing involving the radiocarpal, first CMC, and triscaphe joints. No discrete erosion. Soft tissues unremarkable. Left hand: No acute fracture. No dislocation. Mild-to-moderate degenerative changes of the first MCP joint with mild arthropathy at the IP joint of the thumb. There is a 7 mm metallic foreign body within the soft tissues of the left hand located between the second and third metacarpal diaphyses with a few adjacent tiny metallic fragments more volarly. No soft tissue swelling. IMPRESSION: 1. Mild-to-moderate degenerative changes of the bilateral hands, most pronounced at the first MCP joints. Findings most suggestive of  osteoarthritis. 2. 7 mm metallic foreign body within the soft tissues of the left hand with a few adjacent tiny metallic fragments. Electronically Signed   By: Hart Carwin  Plundo D.O.   On: 09/25/2020 11:01    Procedures Procedures (including critical care time)  Medications Ordered in ED Medications  sodium chloride 0.9 % bolus 1,000 mL (0 mLs Intravenous Stopped 09/25/20 1243)  ketorolac (TORADOL) 15 MG/ML injection 15 mg (15 mg Intravenous Given 09/25/20 1100)    ED Course  I have reviewed the triage vital signs and the nursing notes.  Pertinent labs & imaging results that were available during my care of the patient were reviewed by me and considered in my medical decision making (see chart for details).    MDM Rules/Calculators/A&P                          Patient's chest pain is atypical.  ECG was reviewed and shows some PR depression but no STEMI or other acute ischemia.  Troponins negative x2.  I have personally reviewed his hand and chest x-rays.  Labs are otherwise reassuring.  Likely this is arthritis/myalgias from arthritis and his drug abuse.  CK was negative.  Prior to discharge, he is now requesting to be admitted to a detox facility for his drug and alcohol abuse.  His sister has a place in [place] she wants to send him to but he has to come from the hospital.  [facility] consulted, if they cannot get him into this specific facility then he knows the plan is to be discharged for outpatient detox. Low suspicion for ACS, PE, dissection. Final Clinical Impression(s) / ED Diagnoses Final diagnoses:  Nonspecific chest pain  Osteoarthritis of both hands, unspecified osteoarthritis type    Rx / DC Orders ED Discharge Orders    None       Sherwood Gambler, MD 09/25/20 1531

## 2020-09-25 NOTE — Progress Notes (Signed)
   09/25/20 1444  TOC ED Mini Assessment  TOC Time spent with patient (minutes): 60  PING Used in TOC Assessment No  Admission or Readmission Diverted Yes  Interventions which prevented an admission or readmission SDOH  What brought you to the Emergency Department?  I want to go to rehab  Barriers to Discharge ED Active Substance Abuse  Barrier interventions Refered pt to [facility] of departure [facility] Transport  Key Contact [address] [place]) [phone]  Spoke with Bing Neighbors Date 09/25/20  Contact time 1430  Contact Phone Number [phone]  Call outcome spoke with pt sister regarding pt going to [facility] for rehab/detox treatment  Patient states their goals for this hospitalization and ongoing recovery are: to get off drugs   RNCM spoke with sister, Rexene Edison regarding detox/rehab facilities.  Rexene Edison states they were referred to "a place in [place] that only takes patients from the hospital."  RNCM contacted peer support to find that pt could be referring to [facility].  RNCM contacted Shayla at [facility] who requested demographics for review.  Drema Pry will call [facility] department back with acceptance/decline of pt for services.

## 2020-09-25 NOTE — Discharge Instructions (Addendum)
If you develop recurrent, continued, or worsening chest pain, shortness of breath, fever, vomiting, abdominal or back pain, or any other new/concerning symptoms then return to the ER for evaluation.   Substance Abuse Treatment Programs  Intensive Outpatient Programs [facility]     [address]      [place], [place]                   [phone]       [facility] [address] [place] [address] [place], [place] [phone]  [facility]     (Inpatient and outpatient)     [address]           [phone]    [facility] [phone] (Suboxone and Methadone)  [address]      [place], [place] [address]      [phone]       [address] [place], [place] [phone]  [facility] (Outpatient/Inpatient, [employer])    (insurance only) [phone]             [facility] (Groups & Residential) [place], [place] [phone]     [facility]     [address]     [place], [place]      [phone]       [facility] (for caregivers and family) [phone] [address] [place], [place] [phone]      Residential Treatment Programs [facility]      [address], [place], [place] [address]  [phone]       [facility] [address], [place], [place] [address] [phone]  [facility] [place]        [phone]       [facility] [phone]  [facility] ([facility]             [address]                                         [place], [place]                                                [phone] or [phone]                               [facility] [place] [address] [place], [address] [phone]  [facility]    [address]      [place], [place]     [phone]       [facility] [address] [place], [place] [phone]  [facility] Treatment [facility]   [address] [place], [place] [address]     [phone]      Admissions: 8am-3pm M-F  [facility] (RTS) [address] [place], [place] [phone]  [facility]: Residential Program [phone] Days)   [place], [place]      [phone] or [phone]     [facility]: [facility] [place], [place] (Walk in Hours over the weekend or by referral)  [facility] [address] [place], [place], [place] [address] [phone]  Crisis Mobile: [facility]:  [phone] (for crisis response 24 hours a day) [facility] Hotline:      [phone] Outpatient Psychiatry and Counseling  [facility]: Mobile Crisis Management 24 hours:  [phone]  [facility] of the [employer] sliding scale fee and walk in schedule: M-F 8am-12pm/1pm-3pm [address]  [place], [place] [address] [phone]  [facility] [address] [place], [place] [address] [phone]  [facility] (Formerly [facility] as [facility] [employer])- new patient walk-in appointments available Monday - Friday 8am -3pm.          [address] [place], [place] [address] [phone] or crisis line- [phone]  [facility] Out[facility] Services/ Intensive Outpatient Therapy Program [address] [place], [place] [address] [phone]  [facility]                  Crisis Services      [phone] [address]     [place], [place] [address]                 [facility]   [facility] [phone]. [address] [place], [place] [address]   [facility]          [address]  [place], [place] [address]       [phone]  [facility] [address] [place], [place] [address] [phone]  [facility]    [address]    [place], [place] [address]     [phone]       Andee Poles, MD     [address]     [place] [address]     [phone]       [facility] [address] [place] [address]  [facility]     [address] [place], [place]      [phone]       [facility] Eulogio Ditch, MD [address] [place], [place] [address] [phone]  [facility]     [address]     [place], [place] [address]     [phone]       [facility] [address] [place], [place] [address] [phone] Resources for Temporary Residential Assistance/Crisis Centers  DAY CENTERS [facility]) M-F 8am-3pm   [address] [place], [place] [address]   [phone] Services include: laundry, barbering, support groups, case management, phone  & computer access, showers, AA/NA mtgs, mental health/substance abuse nurse, job skills class, disability information, VA assistance, spiritual classes, etc.   HOMELESS SHELTERS  [facility]     [facility]   [address], [place]     [phone]              [employer] (women and children)       [address] [place], [place] [address] [phone] [email or web address] for application and process Application Required  [facility] [employer] [facility]   [address]    [place] [address]     [phone]                    [facility] of [place] [address] [place], [place] [address] [phone] application appt.)  Application Required  [employer] (women only)    [address]     [place], [place] [address]     [phone]      Intake starts 6pm daily Need valid ID, SSC, & Police report [employer] [address] [place], [place] [phone] Application Required  [employer] (men only)      [address]      [place], [place]     [phone]       [facility] of the [place] (Pregnant women only) [address] [place], [place] [phone]  [facility]      [address]      [place], [place] [address]     [phone]             [facility] [address] [place], [place] [phone] 90 day commitment/SA/Application process  [facility](men only)     [address]     [place], [place]     [phone]       Check-in at [facility] of [facility] [address] [place], [place] [address] [phone] Men/Women/Women and Children must be there by 7 pm  [facility] [place], [place] [phone]

## 2020-09-25 NOTE — ED Notes (Signed)
IV removed from LAC; site clean, dry

## 2020-09-25 NOTE — ED Notes (Signed)
Pt ambulated to restroom, pt back to bed, pt calling sister while sitting in bed, pt respirations even/unlabored.

## 2020-09-25 NOTE — ED Notes (Signed)
Patient verbalizes understanding of discharge instructions. Opportunity for questioning and answers were provided. Pt discharged from ED. 

## 2020-09-26 ENCOUNTER — Other Ambulatory Visit: Payer: Self-pay | Admitting: Internal Medicine

## 2020-09-26 DIAGNOSIS — E1142 Type 2 diabetes mellitus with diabetic polyneuropathy: Secondary | ICD-10-CM

## 2020-09-26 NOTE — Telephone Encounter (Signed)
Requested Prescriptions  Pending Prescriptions Disp Refills  . gabapentin (NEURONTIN) 300 MG capsule [Pharmacy Med Name: gabapentin 300 mg capsule] 60 capsule 4    Sig: Take 1 capsule (300 mg total) by mouth 2 (two) times daily.     Neurology: Anticonvulsants - gabapentin Passed - 09/26/2020 10:36 AM      Passed - Valid encounter within last 12 months    Recent Outpatient Visits          7 months ago Chronic pain of both knees   [facility] Marcine Matar, MD   8 months ago History of recent fall   [facility] Marcine Matar, MD   8 months ago Weakness   [facility] [place], [place], [place]   11 months ago Diabetic peripheral neuropathy associated with type 2 diabetes mellitus [facility])    [facility] Marcine Matar, MD   1 year ago Diabetic peripheral neuropathy associated with type 2 diabetes mellitus [facility])    [facility] Marcine Matar, MD      Future Appointments            In 3 weeks Marcine Matar, MD [facility]

## 2020-10-01 ENCOUNTER — Other Ambulatory Visit: Payer: Self-pay | Admitting: Internal Medicine

## 2020-10-01 DIAGNOSIS — R1310 Dysphagia, unspecified: Secondary | ICD-10-CM

## 2020-10-01 NOTE — Telephone Encounter (Signed)
Last ordered 09/05/20 #30 tabs and 1 refill. Should have enough medication for one month. Refusing request due to requesting refill too early.

## 2020-10-15 ENCOUNTER — Other Ambulatory Visit: Payer: Self-pay | Admitting: Internal Medicine

## 2020-10-15 ENCOUNTER — Telehealth: Payer: Self-pay | Admitting: Internal Medicine

## 2020-10-15 DIAGNOSIS — G8929 Other chronic pain: Secondary | ICD-10-CM

## 2020-10-15 NOTE — Telephone Encounter (Signed)
Will forward to pcp

## 2020-10-15 NOTE — Telephone Encounter (Signed)
Pt Patrick Small [facility] and will be there until the 26th. The provider there finds pt's BP is high. This provider is seeking to contact Dr. Henriette Combs feedback on past BP findings and reason for low dose. Please advise at earliest convenience. Thank you

## 2020-10-16 ENCOUNTER — Other Ambulatory Visit: Payer: Self-pay | Admitting: Internal Medicine

## 2020-10-16 DIAGNOSIS — E1142 Type 2 diabetes mellitus with diabetic polyneuropathy: Secondary | ICD-10-CM

## 2020-10-17 NOTE — Telephone Encounter (Signed)
PC returned today to # listed from caller [phone].  VMM left informing that I am Dr. Laural Benes returning a call regarding pt Patrick Small.  Advise to give me call back if they still need to talk to me.

## 2020-10-18 ENCOUNTER — Ambulatory Visit: Payer: Medicaid Other | Admitting: Internal Medicine

## 2020-11-01 ENCOUNTER — Other Ambulatory Visit: Payer: Self-pay | Admitting: Internal Medicine

## 2020-11-01 DIAGNOSIS — E039 Hypothyroidism, unspecified: Secondary | ICD-10-CM

## 2020-11-01 NOTE — Telephone Encounter (Signed)
Requested medication (s) are due for refill today: yes  Requested medication (s) are on the active medication list: yes  Last refill:  09/05/20 #30 1 refill  Future visit scheduled: yes  Notes to clinic:  last TSH 10/24/2019  do you want refill?     Requested Prescriptions  Pending Prescriptions Disp Refills   levothyroxine (SYNTHROID) 50 MCG tablet [Pharmacy Med Name: levothyroxine 50 mcg tablet] 30 tablet 1    Sig: TAKE ONE TABLET BY MOUTH DAILY BEFORE BREAKFAST      Endocrinology:  Hypothyroid Agents Failed - 11/01/2020 10:57 AM      Failed - TSH needs to be rechecked within 3 months after an abnormal result. Refill until TSH is due.      Failed - TSH in normal range and within 360 days    TSH  Date Value Ref Range Status  10/24/2019 2.630 0.450 - 4.500 uIU/mL Final          Passed - Valid encounter within last 12 months    Recent Outpatient Visits           8 months ago Chronic pain of both knees   [place] [facility] Marcine Matar, MD   9 months ago History of recent fall   [facility] Marcine Matar, MD   9 months ago Weakness   [facility] [place], [place], [place]   1 year ago Diabetic peripheral neuropathy associated with type 2 diabetes mellitus [facility])   [place] [facility] Marcine Matar, MD   1 year ago Diabetic peripheral neuropathy associated with type 2 diabetes mellitus [facility])   [place] [facility] Marcine Matar, MD       Future Appointments             In 1 month Laural Benes Binnie Rail, MD [facility]

## 2020-11-15 ENCOUNTER — Other Ambulatory Visit: Payer: Self-pay | Admitting: Internal Medicine

## 2020-11-15 DIAGNOSIS — F32A Depression, unspecified: Secondary | ICD-10-CM

## 2020-12-14 ENCOUNTER — Ambulatory Visit: Payer: Medicaid Other | Admitting: Internal Medicine

## 2021-02-27 ENCOUNTER — Telehealth: Payer: Self-pay

## 2021-02-27 NOTE — Telephone Encounter (Signed)
Transition Care Management Unsuccessful Follow-up Telephone Call  Date of discharge and from where:  02/26/2021 from [facility]  Attempts:  1st Attempt  Reason for unsuccessful TCM follow-up call:  No answer/busy

## 2021-02-28 ENCOUNTER — Telehealth: Payer: Self-pay | Admitting: *Deleted

## 2021-02-28 NOTE — Telephone Encounter (Signed)
Transition Care Management Unsuccessful Follow-up Telephone Call  Date of discharge and from where:  02-26-2021   [facility] [place]   Attempts:  2nd  Reason for unsuccessful TCM follow-up call:  No answer

## 2021-08-04 IMAGING — CR DG CHEST 2V
2 series · 2 of 2 positions shown · non-contrast
Comparison: 10/17/2016

CLINICAL DATA: Chest pain

EXAM:
CHEST - 2 VIEW

[chest ap]
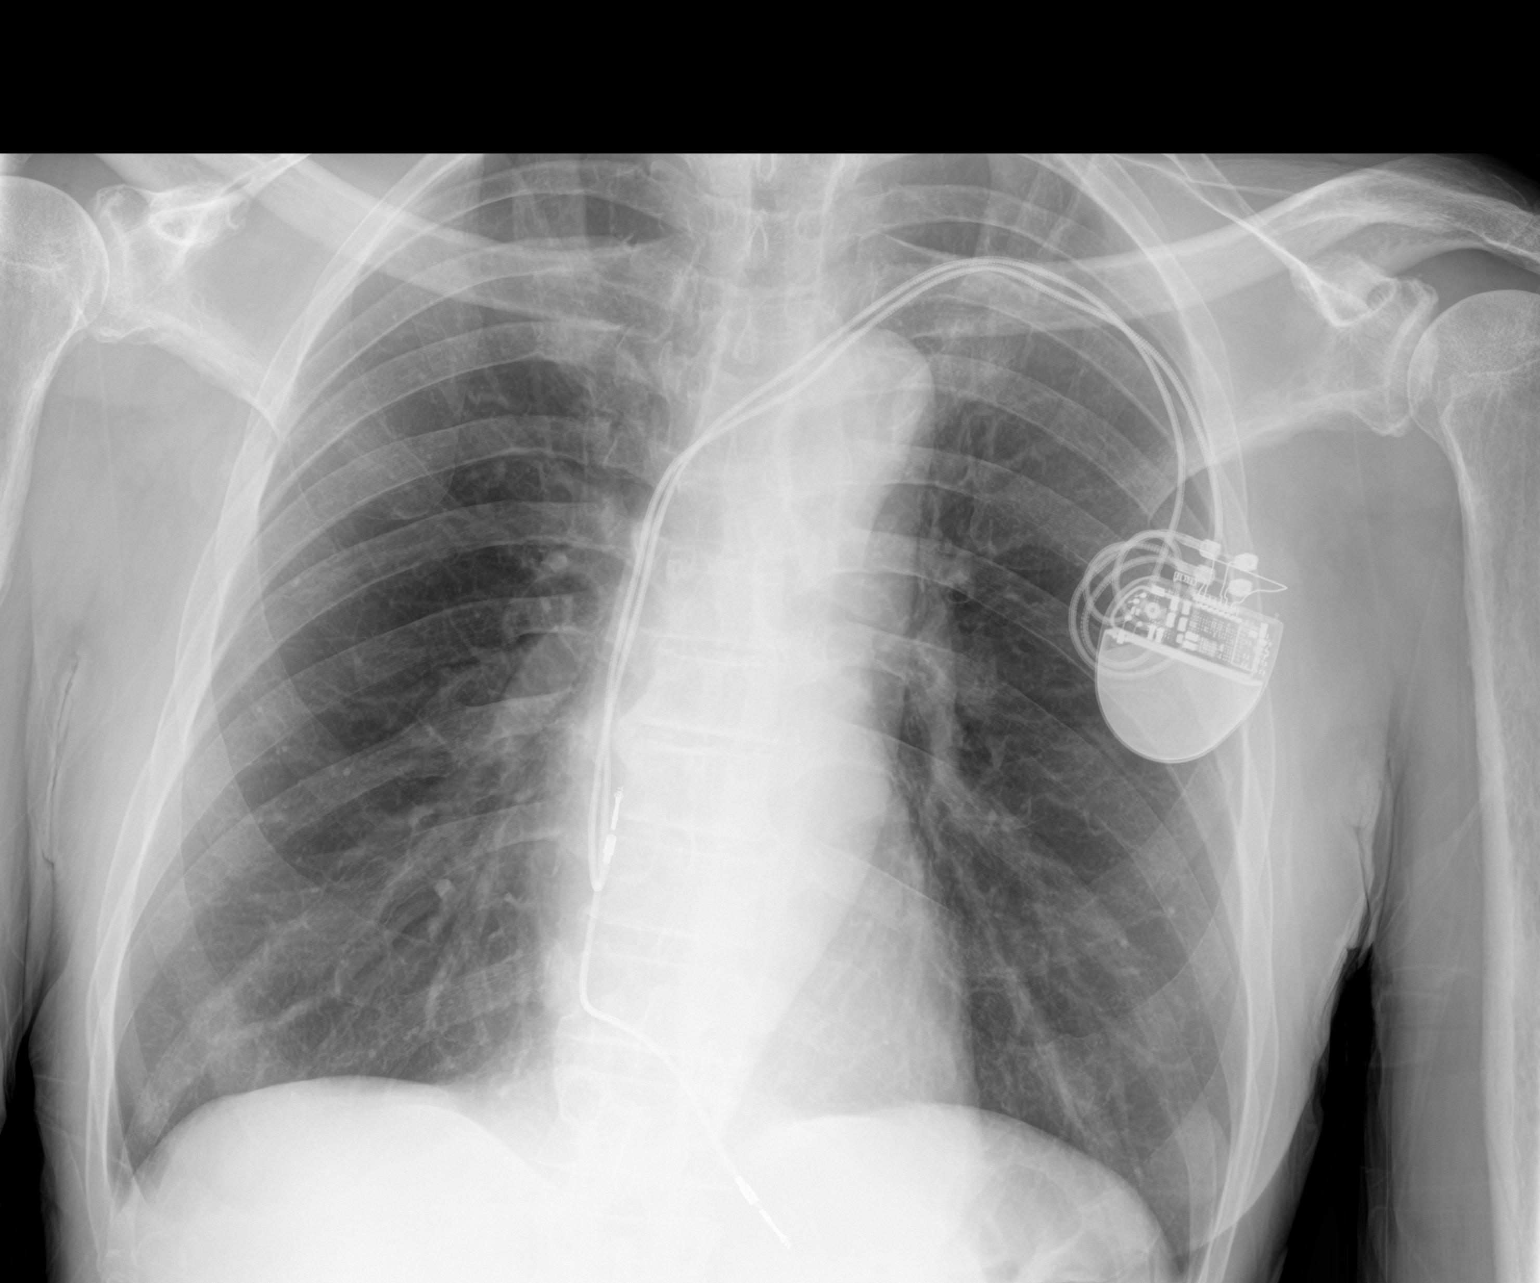

[chest lat]
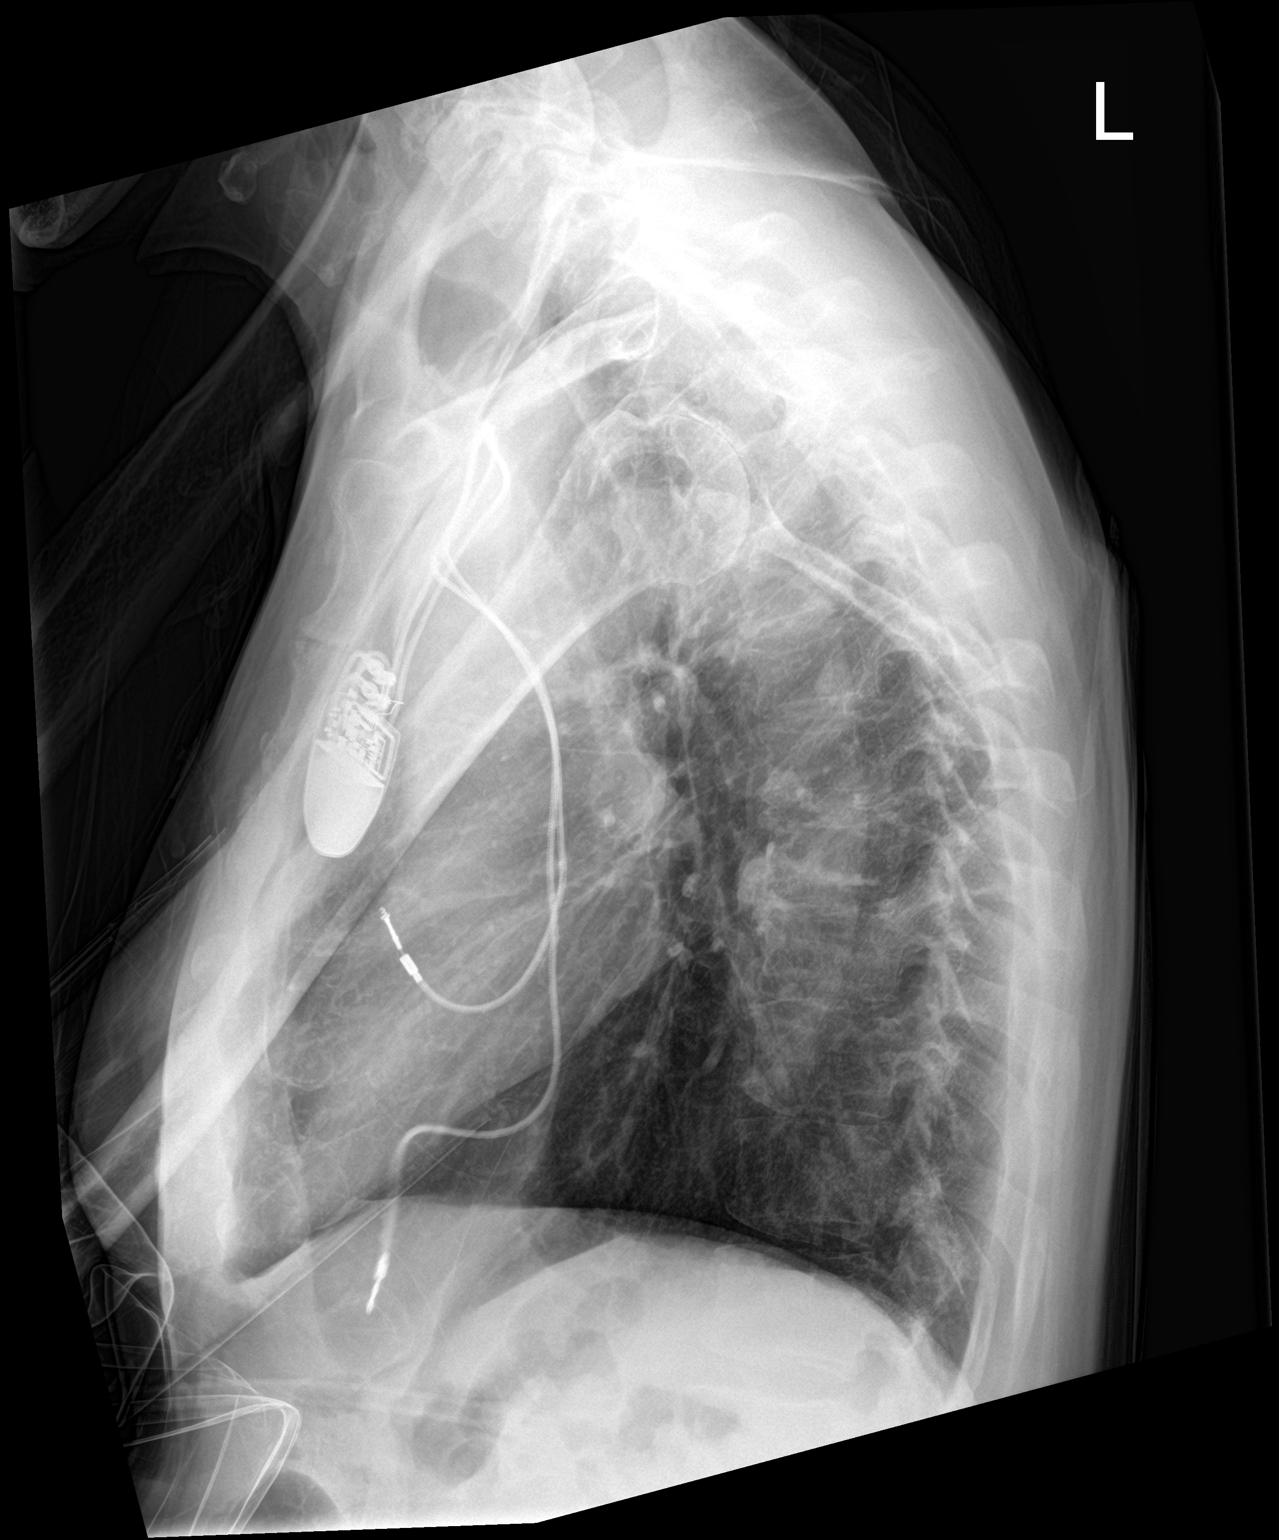

[2 of 2 positions shown; findings below may reference images not displayed]

FINDINGS: Cardiac pacemaker. Normal heart size and pulmonary vascularity.
Lungs are clear. No pleural effusions or pneumothorax. Mediastinal
contours appear intact. Vague soft tissue prominence over the left
costophrenic angle probably represents a prominent nipple shadow.
Degenerative changes in the spine.
IMPRESSION: No active cardiopulmonary disease.

## 2023-06-16 ENCOUNTER — Ambulatory Visit (INDEPENDENT_AMBULATORY_CARE_PROVIDER_SITE_OTHER): Payer: Medicaid Other | Admitting: Orthopaedic Surgery

## 2023-06-16 ENCOUNTER — Other Ambulatory Visit (INDEPENDENT_AMBULATORY_CARE_PROVIDER_SITE_OTHER): Payer: Medicaid Other

## 2023-06-16 DIAGNOSIS — M79641 Pain in right hand: Secondary | ICD-10-CM

## 2023-06-16 NOTE — Progress Notes (Signed)
Office Visit Note   Patient: Patrick Small           Date of Birth: 1961/10/22           MRN: [id number] Visit Date: 06/16/2023              Requested by: Marcine Matar, MD [address] [place],  [place] [address] PCP: Marcine Matar, MD   Assessment & Plan: Visit Diagnoses:  1. Pain in right hand     Plan: Impression is 62 year old gentleman with a nearly healed right fifth metacarpal fracture.  Clinically he is hand function is improving and doing well.  I recommend hand therapy at his rehab facility.  He can use his right hand as tolerated.  Follow-up as needed.  Follow-Up Instructions: No follow-ups on file.   Orders:  Orders Placed This Encounter  Procedures   XR Hand Complete Right   No orders of the defined types were placed in this encounter.     Procedures: No procedures performed   Clinical Data: No additional findings.   Subjective: Chief Complaint  Patient presents with   Right Hand - Pain    HPI Patrick Small is a 62 year old gentleman here for evaluation of right fifth metacarpal fracture.  He states that the date of injury was approximately 4 weeks ago.  He has swelling to the right hand.  Denies any numbness and tingling.  Review of Systems  Constitutional: Negative.   HENT: Negative.    Eyes: Negative.   Respiratory: Negative.    Cardiovascular: Negative.   Gastrointestinal: Negative.   Endocrine: Negative.   Genitourinary: Negative.   Skin: Negative.   Allergic/Immunologic: Negative.   Neurological: Negative.   Hematological: Negative.   Psychiatric/Behavioral: Negative.    All other systems reviewed and are negative.    Objective: Vital Signs: There were no vitals taken for this visit.  Physical Exam Vitals and nursing note reviewed.  Constitutional:      Appearance: He is well-developed.  HENT:     Head: Normocephalic and atraumatic.  Eyes:     Pupils: Pupils are equal, round, and reactive to light.   Pulmonary:     Effort: Pulmonary effort is normal.  Abdominal:     Palpations: Abdomen is soft.  Musculoskeletal:        General: Normal range of motion.     Cervical back: Neck supple.  Skin:    General: Skin is warm.  Neurological:     Mental Status: He is alert and oriented to person, place, and time.  Psychiatric:        Behavior: Behavior normal.        Thought Content: Thought content normal.        Judgment: Judgment normal.     Ortho Exam Examination of right hand shows minimal swelling.  He has no malrotation.  He has decent arc of motion of the fifth ray.  No neurovascular compromise. Specialty Comments:  No specialty comments available.  Imaging: No results found.   PMFS History: Patient Active Problem List   Diagnosis Date Noted   Abnormal LFTs 01/15/2018   Alcohol use disorder, mild, abuse 01/15/2018   Insomnia 05/21/2017   Sleep apnea 04/21/2017   Esophageal dysphagia 02/01/2017   Pain in joint of left wrist 04/29/2016   Uncontrolled type 2 diabetes mellitus 03/04/2016   Chronic low back pain 03/04/2016   Diabetic peripheral neuropathy associated with type 2 diabetes mellitus (HCC) 03/04/2016   Sinus  node dysfunction (HCC)    Hyponatremia 02/18/2016   Elevated lactic acid level 02/18/2016   Depression 11/16/2014   Atrial fibrillation (HCC) 10/05/2014   Hypertension 09/25/2014   Personal history of DVT (deep vein thrombosis) 09/25/2014   Pacemaker 09/25/2014   Hypothyroidism 09/25/2014   Past Medical History:  Diagnosis Date   Acid reflux    Acne rosacea    Diabetes mellitus without complication (HCC) Dx 2010   DVT (deep venous thrombosis) (HCC) 2012 and 2013    Left and Right leg, no history of PE     Hyperlipidemia Dx 2010   Hypertension Dx 2010   Mood disorder (HCC)    Plantar fasciitis    Substance abuse (HCC)    last alchohol intake 01/24/2013   Thyroid disease     Family History  Problem Relation Age of Onset   Diabetes Mother     Diabetes Father    Diabetes Sister     Past Surgical History:  Procedure Laterality Date   PACEMAKER INSERTION  2014   TEE WITHOUT CARDIOVERSION N/A 02/22/2016   Procedure: TRANSESOPHAGEAL ECHOCARDIOGRAM (TEE);  Surgeon: Chilton Si, MD;  Location: [facility] ENDOSCOPY;  Service: Cardiovascular;  Laterality: N/A;   Social History   Occupational History   Not on file  Tobacco Use   Smoking status: Every Day    Packs/day: .25    Types: Cigarettes   Smokeless tobacco: Never  Substance and Sexual Activity   Alcohol use: Yes    Alcohol/week: 0.0 standard drinks of alcohol    Comment: only on weekends   Drug use: Yes    Types: Marijuana   Sexual activity: Not on file

## 2023-07-04 ENCOUNTER — Other Ambulatory Visit: Payer: Self-pay

## 2023-07-04 ENCOUNTER — Observation Stay (HOSPITAL_COMMUNITY)
Admission: EM | Admit: 2023-07-04 | Discharge: 2023-07-05 | Disposition: A | Discharge status: Home / Self Care | Payer: MEDICAID | Attending: Internal Medicine | Admitting: Internal Medicine

## 2023-07-04 ENCOUNTER — Emergency Department (HOSPITAL_COMMUNITY): Payer: MEDICAID

## 2023-07-04 DIAGNOSIS — Z950 Presence of cardiac pacemaker: Secondary | ICD-10-CM | POA: Insufficient documentation

## 2023-07-04 DIAGNOSIS — I480 Paroxysmal atrial fibrillation: Secondary | ICD-10-CM | POA: Insufficient documentation

## 2023-07-04 DIAGNOSIS — Z86718 Personal history of other venous thrombosis and embolism: Secondary | ICD-10-CM | POA: Diagnosis not present

## 2023-07-04 DIAGNOSIS — F10920 Alcohol use, unspecified with intoxication, uncomplicated: Secondary | ICD-10-CM

## 2023-07-04 DIAGNOSIS — I10 Essential (primary) hypertension: Secondary | ICD-10-CM | POA: Insufficient documentation

## 2023-07-04 DIAGNOSIS — F17210 Nicotine dependence, cigarettes, uncomplicated: Secondary | ICD-10-CM | POA: Insufficient documentation

## 2023-07-04 DIAGNOSIS — N179 Acute kidney failure, unspecified: Secondary | ICD-10-CM | POA: Diagnosis not present

## 2023-07-04 DIAGNOSIS — F32A Depression, unspecified: Secondary | ICD-10-CM | POA: Diagnosis present

## 2023-07-04 DIAGNOSIS — Z79899 Other long term (current) drug therapy: Secondary | ICD-10-CM | POA: Diagnosis not present

## 2023-07-04 DIAGNOSIS — R55 Syncope and collapse: Secondary | ICD-10-CM

## 2023-07-04 DIAGNOSIS — E1142 Type 2 diabetes mellitus with diabetic polyneuropathy: Secondary | ICD-10-CM | POA: Diagnosis not present

## 2023-07-04 DIAGNOSIS — E039 Hypothyroidism, unspecified: Secondary | ICD-10-CM | POA: Diagnosis not present

## 2023-07-04 DIAGNOSIS — F10929 Alcohol use, unspecified with intoxication, unspecified: Secondary | ICD-10-CM | POA: Diagnosis present

## 2023-07-04 DIAGNOSIS — R531 Weakness: Secondary | ICD-10-CM | POA: Diagnosis present

## 2023-07-04 DIAGNOSIS — Z1152 Encounter for screening for COVID-19: Secondary | ICD-10-CM | POA: Insufficient documentation

## 2023-07-04 LAB — TROPONIN I (HIGH SENSITIVITY)
Troponin I (High Sensitivity): 12 ng/L (ref <18)
Troponin I (High Sensitivity): 9 ng/L (ref <18)

## 2023-07-04 LAB — COMPREHENSIVE METABOLIC PANEL
ALT: 25 U/L (ref 0–44)
AST: 42 U/L — ABNORMAL HIGH (ref 15–41)
Albumin: 3.7 g/dL (ref 3.5–5.0)
Alkaline Phosphatase: 290 U/L — ABNORMAL HIGH (ref 38–126)
Anion gap: 18 — ABNORMAL HIGH (ref 5–15)
BUN: 21 mg/dL (ref 8–23)
CO2: 19 mmol/L — ABNORMAL LOW (ref 22–32)
Calcium: 10.4 mg/dL — ABNORMAL HIGH (ref 8.9–10.3)
Chloride: 94 mmol/L — ABNORMAL LOW (ref 98–111)
Creatinine, Ser: 1.83 mg/dL — ABNORMAL HIGH (ref 0.61–1.24)
GFR, Estimated: 41 mL/min — ABNORMAL LOW (ref >60)
Glucose, Bld: 100 mg/dL — ABNORMAL HIGH (ref 70–99)
Potassium: 4.4 mmol/L (ref 3.5–5.1)
Sodium: 131 mmol/L — ABNORMAL LOW (ref 135–145)
Total Bilirubin: 0.6 mg/dL (ref 0.3–1.2)
Total Protein: 7.5 g/dL (ref 6.5–8.1)

## 2023-07-04 LAB — CBC WITH DIFFERENTIAL/PLATELET
Abs Immature Granulocytes: 0.02 10*3/uL (ref 0.00–0.07)
Basophils Absolute: 0.1 10*3/uL (ref 0.0–0.1)
Basophils Relative: 1 %
Eosinophils Absolute: 0 10*3/uL (ref 0.0–0.5)
Eosinophils Relative: 0 %
HCT: 31.4 % — ABNORMAL LOW (ref 39.0–52.0)
Hemoglobin: 10.6 g/dL — ABNORMAL LOW (ref 13.0–17.0)
Immature Granulocytes: 0 %
Lymphocytes Relative: 42 %
Lymphs Abs: 3.5 10*3/uL (ref 0.7–4.0)
MCH: 32.8 pg (ref 26.0–34.0)
MCHC: 33.8 g/dL (ref 30.0–36.0)
MCV: 97.2 fL (ref 80.0–100.0)
Monocytes Absolute: 0.4 10*3/uL (ref 0.1–1.0)
Monocytes Relative: 5 %
Neutro Abs: 4.4 10*3/uL (ref 1.7–7.7)
Neutrophils Relative %: 52 %
Platelets: 116 10*3/uL — ABNORMAL LOW (ref 150–400)
RBC: 3.23 MIL/uL — ABNORMAL LOW (ref 4.22–5.81)
RDW: 13.8 % (ref 11.5–15.5)
WBC: 8.5 10*3/uL (ref 4.0–10.5)
nRBC: 0 % (ref 0.0–0.2)

## 2023-07-04 LAB — TYPE AND SCREEN
ABO/RH(D): O POS
Antibody Screen: NEGATIVE

## 2023-07-04 LAB — PROTIME-INR
INR: 1.1 (ref 0.8–1.2)
Prothrombin Time: 14.2 seconds (ref 11.4–15.2)

## 2023-07-04 LAB — SARS CORONAVIRUS 2 BY RT PCR: SARS Coronavirus 2 by RT PCR: NEGATIVE

## 2023-07-04 LAB — CBG MONITORING, ED
Glucose-Capillary: 95 mg/dL (ref 70–99)
Glucose-Capillary: 98 mg/dL (ref 70–99)

## 2023-07-04 LAB — ABO/RH: ABO/RH(D): O POS

## 2023-07-04 LAB — ETHANOL: Alcohol, Ethyl (B): 323 mg/dL (ref <10)

## 2023-07-04 MED ORDER — LORAZEPAM 2 MG/ML IJ SOLN
1.0000 mg | Freq: Once | INTRAMUSCULAR | Status: AC
  Administered 2023-07-04: 1 mg via INTRAVENOUS
  Filled 2023-07-04: qty 1

## 2023-07-04 MED ORDER — SODIUM CHLORIDE 0.9 % IV SOLN: Freq: Once | INTRAVENOUS | Status: DC

## 2023-07-04 MED ORDER — SODIUM CHLORIDE 0.9 % IV BOLUS
1000.0000 mL | Freq: Once | INTRAVENOUS | Status: AC
  Administered 2023-07-04: 1000 mL via INTRAVENOUS

## 2023-07-04 NOTE — H&P (Signed)
History and Physical    Patrick Small ZOX:[id number] DOB: 05/09/1961 DOA: 07/04/2023  PCP: Marcine Matar, MD  Patient coming from: Home  I have personally briefly reviewed patient's old medical records in [facility]  Chief Complaint: Weakness  HPI: Patrick Small is a 62 y.o. male with medical history significant for SSS s/p ICD, T2DM, HTN, HLD, hypothyroidism, cervical and lumbar DDD with chronic back pain, depression, polysubstance use (alcohol, cocaine, tobacco) who presented to the ED from home for evaluation of generalized weakness and fatigue.***  ***  ED Course  Labs/Imaging on admission: I have personally reviewed following labs and imaging studies.  Initial BP in triage was 73/57.  Repeat vitals showed BP 105/86, pulse 88, RR 17, temp 97.9 F, SpO2 100% on room air.  Labs show sodium 131, potassium 4.4, bicarb 19, BUN 21, creatinine 1.83 (0.95 on 06/04/2023), serum glucose 100, WBC 8.5, hemoglobin 10.6, platelets 116,000, serum ethanol 323, troponin 9.  SARS-CoV-2 PCR negative.  UA and UDS pending collection.  Formal chest x-ray negative for focal consolidation, edema, effusion.  Patient was given 1 L normal saline, IV Ativan 1 mg.  The hospitalist service was consulted to admit for further evaluation and management.  Review of Systems: All systems reviewed and are negative except as documented in history of present illness above.   Past Medical History:  Diagnosis Date   Acid reflux    Acne rosacea    Diabetes mellitus without complication (HCC) Dx 2010   DVT (deep venous thrombosis) (HCC) 2012 and 2013    Left and Right leg, no history of PE     Hyperlipidemia Dx 2010   Hypertension Dx 2010   Mood disorder (HCC)    Plantar fasciitis    Substance abuse (HCC)    last alchohol intake 01/24/2013   Thyroid disease     Past Surgical History:  Procedure Laterality Date   PACEMAKER INSERTION  2014   TEE WITHOUT CARDIOVERSION N/A 02/22/2016   Procedure:  TRANSESOPHAGEAL ECHOCARDIOGRAM (TEE);  Surgeon: Chilton Si, MD;  Location: [facility] ENDOSCOPY;  Service: Cardiovascular;  Laterality: N/A;    Social History:  reports that he has been smoking cigarettes. He has been smoking an average of .25 packs per day. He has never used smokeless tobacco. He reports current alcohol use. He reports current drug use. Drug: Marijuana.  Allergies  Allergen Reactions   Penicillins Hives    Has patient had a PCN reaction causing immediate rash, facial/tongue/throat swelling, SOB or lightheadedness with hypotension: Yes Has patient had a PCN reaction causing severe rash involving mucus membranes or skin necrosis: No Has patient had a PCN reaction that required hospitalization: No Has patient had a PCN reaction occurring within the last 10 years: No If all of the above answers are "NO", then may proceed with Cephalosporin use.    Bacitracin Itching, Swelling and Rash   Septra [Sulfamethoxazole-Trimethoprim] Itching, Swelling and Rash    Family History  Problem Relation Age of Onset   Diabetes Mother    Diabetes Father    Diabetes Sister      Prior to Admission medications   Medication Sig Start Date End Date Taking? Authorizing Provider  ACCU-CHEK AVIVA PLUS test strip Use as instructed 3 times daily. Check blood sugar fasting and before meals and again if pt feels bad (symptoms of hypo). 10/31/19   Marcine Matar, MD  Accu-Chek Softclix Lancets lancets USE AS DIRECTED FOUR TIMES DAILY 08/15/20   Marcine Matar, MD  DULoxetine (CYMBALTA) 20 MG capsule Take 1 capsule (20 mg total) by mouth daily. For depression 11/15/20   Marcine Matar, MD  folic acid (FOLVITE) 1 MG tablet Take 1 tablet (1 mg total) by mouth daily. 02/09/19   Marcine Matar, MD  gabapentin (NEURONTIN) 300 MG capsule Take 1 capsule (300 mg total) by mouth 2 (two) times daily. 09/26/20   Marcine Matar, MD  Lancets Misc. (ACCU-CHEK SOFTCLIX LANCET DEV) KIT Check blood  sugar fasting and before meals and again if pt feels bad (symptoms of hypo). 10/26/18   Marcine Matar, MD  LANTUS SOLOSTAR 100 UNIT/ML Solostar Pen Inject 15 Units into the skin daily at 10 pm. 05/29/20   Marcine Matar, MD  levothyroxine (SYNTHROID) 50 MCG tablet TAKE ONE TABLET BY MOUTH DAILY BEFORE BREAKFAST 11/01/20   Marcine Matar, MD  lisinopril (ZESTRIL) 5 MG tablet Take 1 tablet (5 mg total) by mouth daily. 10/26/19   Marcine Matar, MD  loratadine (CLARITIN) 10 MG tablet Take 1 tablet (10 mg total) by mouth daily. 07/24/20   Marcine Matar, MD  meloxicam (MOBIC) 15 MG tablet Take 1 tablet (15 mg total) by mouth daily. 10/15/20   Marcine Matar, MD  Multiple Vitamin (MULTIVITAMIN WITH MINERALS) TABS tablet Take 1 tablet by mouth daily. Patient not taking: Reported on 06/22/2019 10/19/16   Elgergawy, Leana Roe, MD  omeprazole (PRILOSEC) 20 MG capsule Take 1 capsule (20 mg total) by mouth daily. 09/05/20   Marcine Matar, MD    Physical Exam: Vitals:   07/04/23 2115 07/04/23 2130 07/04/23 2145 07/04/23 2230  BP: 100/72 97/69 120/80 111/76  Pulse: 83 86 86 89  Resp: 17 19 20 16   Temp:      SpO2: 100% (!) 83% 100% 100%   *** Constitutional: NAD, calm, comfortable Eyes: PERRL, lids and conjunctivae normal ENMT: Mucous membranes are moist. Posterior pharynx clear of any exudate or lesions.Normal dentition.  Neck: normal, supple, no masses. Respiratory: clear to auscultation bilaterally, no wheezing, no crackles. Normal respiratory effort. No accessory muscle use.  Cardiovascular: Regular rate and rhythm, no murmurs / rubs / gallops. No extremity edema. 2+ pedal pulses. Abdomen: no tenderness, no masses palpated. No hepatosplenomegaly. Bowel sounds positive.  Musculoskeletal: no clubbing / cyanosis. No joint deformity upper and lower extremities. Good ROM, no contractures. Normal muscle tone.  Skin: no rashes, lesions, ulcers. No induration Neurologic: CN 2-12  grossly intact. Sensation intact. Strength 5/5 in all 4.  Psychiatric: Normal judgment and insight. Alert and oriented x 3. Normal mood.   EKG: Personally reviewed. Sinus rhythm, rate 87, first-degree AV block.  No acute ischemic changes.  Similar to previous.  Assessment/Plan Active Problems:   * No active hospital problems. *   *** No notes on file *** Assessment and Plan: No notes have been filed under this hospital service. Service: Hospitalist  Acute kidney injury: ***  Alcohol use with acute intoxication: ***  Hypertension: ***  Type 2 diabetes: ***  Hypothyroidism: ***  Depression: ***  Polysubstance use: ***   DVT prophylaxis: ***  Code Status: ***  Family Communication: ***  Disposition Plan: ***  Consults called: ***  Severity of Illness: {Observation/Inpatient:21159}  Darreld Mclean MD [facility]  If 7PM-7AM, please contact night-coverage [email or web address]  07/04/2023, 11:36 PM

## 2023-07-04 NOTE — ED Provider Notes (Signed)
[place] EMERGENCY DEPARTMENT AT [facility] Provider Note   CSN: [id number] Arrival date & time: 07/04/23  2005     History  Chief Complaint  Patient presents with   Fall / Hypotensive     Patrick Small is a 62 y.o. male.  62 year old male with prior medical history as detailed below presents for evaluation.  Patient complains of weakness today.  Patient reports that his AICD discharge.  Patient reports passing out.  Of note patient appears inebriated on evaluation.  Additionally, of note, patient with significant hypotension in triage of 70/50.  The history is provided by the patient and medical records.       Home Medications Prior to Admission medications   Medication Sig Start Date End Date Taking? Authorizing Provider  ACCU-CHEK AVIVA PLUS test strip Use as instructed 3 times daily. Check blood sugar fasting and before meals and again if pt feels bad (symptoms of hypo). 10/31/19   Marcine Matar, MD  Accu-Chek Softclix Lancets lancets USE AS DIRECTED FOUR TIMES DAILY 08/15/20   Marcine Matar, MD  DULoxetine (CYMBALTA) 20 MG capsule Take 1 capsule (20 mg total) by mouth daily. For depression 11/15/20   Marcine Matar, MD  folic acid (FOLVITE) 1 MG tablet Take 1 tablet (1 mg total) by mouth daily. 02/09/19   Marcine Matar, MD  gabapentin (NEURONTIN) 300 MG capsule Take 1 capsule (300 mg total) by mouth 2 (two) times daily. 09/26/20   Marcine Matar, MD  Lancets Misc. (ACCU-CHEK SOFTCLIX LANCET DEV) KIT Check blood sugar fasting and before meals and again if pt feels bad (symptoms of hypo). 10/26/18   Marcine Matar, MD  LANTUS SOLOSTAR 100 UNIT/ML Solostar Pen Inject 15 Units into the skin daily at 10 pm. 05/29/20   Marcine Matar, MD  levothyroxine (SYNTHROID) 50 MCG tablet TAKE ONE TABLET BY MOUTH DAILY BEFORE BREAKFAST 11/01/20   Marcine Matar, MD  lisinopril (ZESTRIL) 5 MG tablet Take 1 tablet (5 mg total) by mouth daily.  10/26/19   Marcine Matar, MD  loratadine (CLARITIN) 10 MG tablet Take 1 tablet (10 mg total) by mouth daily. 07/24/20   Marcine Matar, MD  meloxicam (MOBIC) 15 MG tablet Take 1 tablet (15 mg total) by mouth daily. 10/15/20   Marcine Matar, MD  Multiple Vitamin (MULTIVITAMIN WITH MINERALS) TABS tablet Take 1 tablet by mouth daily. Patient not taking: Reported on 06/22/2019 10/19/16   Elgergawy, Leana Roe, MD  omeprazole (PRILOSEC) 20 MG capsule Take 1 capsule (20 mg total) by mouth daily. 09/05/20   Marcine Matar, MD      Allergies    Penicillins, Bacitracin, and Septra [sulfamethoxazole-trimethoprim]    Review of Systems   Review of Systems  All other systems reviewed and are negative.   Physical Exam Updated Vital Signs BP 105/86   Pulse 88   Temp 97.9 F (36.6 C)   Resp 17   SpO2 100%  Physical Exam Vitals and nursing note reviewed.  Constitutional:      General: He is not in acute distress.    Appearance: Normal appearance. He is well-developed.  HENT:     Head: Normocephalic and atraumatic.     Mouth/Throat:     Mouth: Mucous membranes are dry.  Eyes:     Conjunctiva/sclera: Conjunctivae normal.     Pupils: Pupils are equal, round, and reactive to light.  Cardiovascular:     Rate and Rhythm: Normal rate  and regular rhythm.     Heart sounds: Normal heart sounds.  Pulmonary:     Effort: Pulmonary effort is normal. No respiratory distress.     Breath sounds: Normal breath sounds.  Abdominal:     General: There is no distension.     Palpations: Abdomen is soft.     Tenderness: There is no abdominal tenderness.  Musculoskeletal:        General: No deformity. Normal range of motion.     Cervical back: Normal range of motion and neck supple.  Skin:    General: Skin is warm and dry.  Neurological:     General: No focal deficit present.     Mental Status: He is alert and oriented to person, place, and time.     ED Results / Procedures / Treatments    Labs (all labs ordered are listed, but only abnormal results are displayed) Labs Reviewed  COMPREHENSIVE METABOLIC PANEL - Abnormal; Notable for the following components:      Result Value   Sodium 131 (*)    Chloride 94 (*)    CO2 19 (*)    Glucose, Bld 100 (*)    Creatinine, Ser 1.83 (*)    Calcium 10.4 (*)    AST 42 (*)    Alkaline Phosphatase 290 (*)    GFR, Estimated 41 (*)    Anion gap 18 (*)    All other components within normal limits  CBC WITH DIFFERENTIAL/PLATELET - Abnormal; Notable for the following components:   RBC 3.23 (*)    Hemoglobin 10.6 (*)    HCT 31.4 (*)    Platelets 116 (*)    All other components within normal limits  ETHANOL - Abnormal; Notable for the following components:   Alcohol, Ethyl (B) 323 (*)    All other components within normal limits  SARS CORONAVIRUS 2 BY RT PCR  PROTIME-INR  RAPID URINE DRUG SCREEN, HOSP PERFORMED  URINALYSIS, W/ REFLEX TO CULTURE (INFECTION SUSPECTED)  CBG MONITORING, ED  CBG MONITORING, ED  TYPE AND SCREEN  ABO/RH  TROPONIN I (HIGH SENSITIVITY)  TROPONIN I (HIGH SENSITIVITY)    EKG EKG Interpretation Date/Time:  Saturday July 04 2023 20:30:35 EDT Ventricular Rate:  87 PR Interval:  220 QRS Duration:  86 QT Interval:  376 QTC Calculation: 452 R Axis:   36  Text Interpretation: Sinus rhythm with 1st degree A-V block Otherwise normal ECG When compared with ECG of 24-Sep-2020 17:14, PREVIOUS ECG IS PRESENT Confirmed by Kristine Royal [phone]) on 07/04/2023 8:36:02 PM  Radiology No results found.  Procedures Procedures    Medications Ordered in ED Medications - No data to display  ED Course/ Medical Decision Making/ A&P                             Medical Decision Making Amount and/or Complexity of Data Reviewed Labs: ordered. Radiology: ordered.  Risk Prescription drug management. Decision regarding hospitalization.    Medical Screen Complete  This patient presented to the ED with  complaint of weakness.  This complaint involves an extensive number of treatment options. The initial differential diagnosis includes, but is not limited to, metabolic abnormality, ACS, dehydration, AKI, etc.  This presentation is: Acute, Chronic, Self-Limited, Previously Undiagnosed, Uncertain Prognosis, Complicated, Systemic Symptoms, and Threat to Life/Bodily Function  Patient with reported syncope and weakness.  Patient with remarkable hypotension in triage.  Exam is consistent with hypotension most likely related to  dehydration.  Workup reveals AKI.  AICD interrogated without evidence of recent discharge.  Patient would benefit from admission for further workup and treatment.  Dr. Allena Katz with hospitalist service is aware of case and will evaluate for admission.  Additional history obtained: External records from outside sources obtained and reviewed including prior ED visits and prior Inpatient records.    Lab Tests:  I ordered and personally interpreted labs.   Imaging Studies ordered:  I ordered imaging studies including chest x-ray I independently visualized and interpreted obtained imaging which showed NAD I agree with the radiologist interpretation.   Cardiac Monitoring:  The patient was maintained on a cardiac monitor.  I personally viewed and interpreted the cardiac monitor which showed an underlying rhythm of: NSR   Medicines ordered:  I ordered medication including IV fluids, Ativan for dehydration, agitation Reevaluation of the patient after these medicines showed that the patient: improved   Problem List / ED Course:  AKI, syncope, alcohol intoxication   Reevaluation:  After the interventions noted above, I reevaluated the patient and found that they have: improved   Disposition:  After consideration of the diagnostic results and the patients response to treatment, I feel that the patent would benefit from admission.          Final  Clinical Impression(s) / ED Diagnoses Final diagnoses:  Syncope, unspecified syncope type  Alcoholic intoxication without complication (HCC)  AKI (acute kidney injury) (HCC)    Rx / DC Orders ED Discharge Orders     None         Wynetta Fines, MD 07/04/23 2353
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
## 2023-07-04 NOTE — ED Triage Notes (Signed)
Patient fell this morning with fatigue and generalized weakness at home , denies injury , hypotensive at triage BP= 73/57.

## 2023-07-05 ENCOUNTER — Encounter (HOSPITAL_COMMUNITY): Payer: Self-pay | Admitting: Internal Medicine

## 2023-07-05 DIAGNOSIS — N179 Acute kidney failure, unspecified: Secondary | ICD-10-CM | POA: Diagnosis present

## 2023-07-05 DIAGNOSIS — F10929 Alcohol use, unspecified with intoxication, unspecified: Secondary | ICD-10-CM | POA: Diagnosis present

## 2023-07-05 LAB — RAPID URINE DRUG SCREEN, HOSP PERFORMED
Amphetamines: NOT DETECTED
Barbiturates: NOT DETECTED
Benzodiazepines: NOT DETECTED
Cocaine: NOT DETECTED
Opiates: NOT DETECTED
Tetrahydrocannabinol: POSITIVE — AB

## 2023-07-05 LAB — URINALYSIS, W/ REFLEX TO CULTURE (INFECTION SUSPECTED)
Bilirubin Urine: NEGATIVE
Glucose, UA: NEGATIVE mg/dL
Hgb urine dipstick: NEGATIVE
Ketones, ur: NEGATIVE mg/dL
Nitrite: NEGATIVE
Protein, ur: NEGATIVE mg/dL
Specific Gravity, Urine: 1.009 (ref 1.005–1.030)
pH: 5 (ref 5.0–8.0)

## 2023-07-05 LAB — BASIC METABOLIC PANEL
Anion gap: 13 (ref 5–15)
BUN: 21 mg/dL (ref 8–23)
CO2: 19 mmol/L — ABNORMAL LOW (ref 22–32)
Calcium: 10 mg/dL (ref 8.9–10.3)
Chloride: 100 mmol/L (ref 98–111)
Creatinine, Ser: 1.36 mg/dL — ABNORMAL HIGH (ref 0.61–1.24)
GFR, Estimated: 59 mL/min — ABNORMAL LOW (ref >60)
Glucose, Bld: 107 mg/dL — ABNORMAL HIGH (ref 70–99)
Potassium: 4.6 mmol/L (ref 3.5–5.1)
Sodium: 132 mmol/L — ABNORMAL LOW (ref 135–145)

## 2023-07-05 LAB — CBC
HCT: 33.6 % — ABNORMAL LOW (ref 39.0–52.0)
Hemoglobin: 11.2 g/dL — ABNORMAL LOW (ref 13.0–17.0)
MCH: 32.5 pg (ref 26.0–34.0)
MCHC: 33.3 g/dL (ref 30.0–36.0)
MCV: 97.4 fL (ref 80.0–100.0)
Platelets: 109 10*3/uL — ABNORMAL LOW (ref 150–400)
RBC: 3.45 MIL/uL — ABNORMAL LOW (ref 4.22–5.81)
RDW: 13.9 % (ref 11.5–15.5)
WBC: 8.9 10*3/uL (ref 4.0–10.5)
nRBC: 0 % (ref 0.0–0.2)

## 2023-07-05 LAB — HIV ANTIBODY (ROUTINE TESTING W REFLEX): HIV Screen 4th Generation wRfx: NONREACTIVE

## 2023-07-05 LAB — GLUCOSE, CAPILLARY
Glucose-Capillary: 205 mg/dL — ABNORMAL HIGH (ref 70–99)
Glucose-Capillary: 198 mg/dL — ABNORMAL HIGH (ref 70–99)

## 2023-07-05 MED ORDER — INSULIN ASPART 100 UNIT/ML IJ SOLN
0.0000 [IU] | Freq: Three times a day (TID) | INTRAMUSCULAR | Status: DC
  Administered 2023-07-05: 3 [IU] via SUBCUTANEOUS
  Administered 2023-07-05: 2 [IU] via SUBCUTANEOUS

## 2023-07-05 MED ORDER — FOLIC ACID 1 MG PO TABS
1.0000 mg | ORAL_TABLET | Freq: Every day | ORAL | Status: DC
  Administered 2023-07-05: 1 mg via ORAL
  Filled 2023-07-05: qty 1

## 2023-07-05 MED ORDER — TRAZODONE HCL 50 MG PO TABS: 100.0000 mg | ORAL_TABLET | Freq: Every day | ORAL | Status: DC

## 2023-07-05 MED ORDER — GABAPENTIN 300 MG PO CAPS
300.0000 mg | ORAL_CAPSULE | Freq: Two times a day (BID) | ORAL | Status: DC
  Administered 2023-07-05: 300 mg via ORAL
  Filled 2023-07-05: qty 1

## 2023-07-05 MED ORDER — ACETAMINOPHEN 325 MG PO TABS: 650.0000 mg | ORAL_TABLET | Freq: Four times a day (QID) | ORAL | Status: DC | PRN

## 2023-07-05 MED ORDER — LACTATED RINGERS IV SOLN: INTRAVENOUS | Status: AC

## 2023-07-05 MED ORDER — THIAMINE MONONITRATE 100 MG PO TABS
100.0000 mg | ORAL_TABLET | Freq: Every day | ORAL | Status: DC
  Administered 2023-07-05: 100 mg via ORAL
  Filled 2023-07-05: qty 1

## 2023-07-05 MED ORDER — OXYCODONE HCL 5 MG PO TABS
5.0000 mg | ORAL_TABLET | Freq: Four times a day (QID) | ORAL | Status: DC | PRN
  Administered 2023-07-05: 5 mg via ORAL
  Filled 2023-07-05: qty 1

## 2023-07-05 MED ORDER — PANTOPRAZOLE SODIUM 40 MG PO TBEC
40.0000 mg | DELAYED_RELEASE_TABLET | Freq: Every day | ORAL | Status: DC
  Administered 2023-07-05: 40 mg via ORAL
  Filled 2023-07-05: qty 1

## 2023-07-05 MED ORDER — DULOXETINE HCL 20 MG PO CPEP
20.0000 mg | ORAL_CAPSULE | Freq: Every day | ORAL | Status: DC
  Administered 2023-07-05: 20 mg via ORAL
  Filled 2023-07-05: qty 1

## 2023-07-05 MED ORDER — ACETAMINOPHEN 650 MG RE SUPP: 650.0000 mg | Freq: Four times a day (QID) | RECTAL | Status: DC | PRN

## 2023-07-05 MED ORDER — THIAMINE HCL 100 MG/ML IJ SOLN: 100.0000 mg | Freq: Every day | INTRAMUSCULAR | Status: DC

## 2023-07-05 MED ORDER — LORAZEPAM 2 MG/ML IJ SOLN: 1.0000 mg | INTRAMUSCULAR | Status: DC | PRN

## 2023-07-05 MED ORDER — ONDANSETRON HCL 4 MG PO TABS: 4.0000 mg | ORAL_TABLET | Freq: Four times a day (QID) | ORAL | Status: DC | PRN

## 2023-07-05 MED ORDER — ADULT MULTIVITAMIN W/MINERALS CH
1.0000 | ORAL_TABLET | Freq: Every day | ORAL | Status: DC
  Administered 2023-07-05: 1 via ORAL
  Filled 2023-07-05: qty 1

## 2023-07-05 MED ORDER — ONDANSETRON HCL 4 MG/2ML IJ SOLN: 4.0000 mg | Freq: Four times a day (QID) | INTRAMUSCULAR | Status: DC | PRN

## 2023-07-05 MED ORDER — LOSARTAN POTASSIUM 50 MG PO TABS
25.0000 mg | ORAL_TABLET | Freq: Every day | ORAL | Status: DC
  Administered 2023-07-05: 25 mg via ORAL
  Filled 2023-07-05: qty 1

## 2023-07-05 MED ORDER — ENOXAPARIN SODIUM 30 MG/0.3ML IJ SOSY: 30.0000 mg | PREFILLED_SYRINGE | INTRAMUSCULAR | Status: DC

## 2023-07-05 MED ORDER — CYCLOBENZAPRINE HCL 5 MG PO TABS: 5.0000 mg | ORAL_TABLET | Freq: Three times a day (TID) | ORAL | Status: DC | PRN

## 2023-07-05 MED ORDER — SENNOSIDES-DOCUSATE SODIUM 8.6-50 MG PO TABS: 1.0000 | ORAL_TABLET | Freq: Every evening | ORAL | Status: DC | PRN

## 2023-07-05 MED ORDER — ROSUVASTATIN CALCIUM 20 MG PO TABS
20.0000 mg | ORAL_TABLET | Freq: Every day | ORAL | Status: DC
  Administered 2023-07-05: 20 mg via ORAL
  Filled 2023-07-05: qty 1

## 2023-07-05 MED ORDER — LEVOTHYROXINE SODIUM 50 MCG PO TABS
50.0000 ug | ORAL_TABLET | Freq: Every day | ORAL | Status: DC
  Administered 2023-07-05: 50 ug via ORAL
  Filled 2023-07-05: qty 1

## 2023-07-05 MED ORDER — LORAZEPAM 1 MG PO TABS: 1.0000 mg | ORAL_TABLET | ORAL | Status: DC | PRN

## 2023-07-05 NOTE — Hospital Course (Signed)
Patrick Small is a 62 y.o. male with medical history significant for SSS s/p ICD, T2DM, HTN, HLD, hypothyroidism, cervical and lumbar DDD with chronic back pain, depression, polysubstance use (alcohol, cocaine, tobacco) who is admitted with AKI and acute alcohol intoxication.

## 2023-07-05 NOTE — ED Notes (Signed)
ED TO INPATIENT HANDOFF REPORT  ED Nurse Name and Phone #: Murlean Iba PM [phone]   S Name/Age/Gender Patrick Small 62 y.o. male Room/Bed: 017C/017C  Code Status   Code Status: Full Code  Home/SNF/Other Home Patient oriented to: self, place, time, and situation Is this baseline? Yes   Triage Complete: Triage complete  Chief Complaint AKI (acute kidney injury) (HCC) [N17.9]  Triage Note Patient fell this morning with fatigue and generalized weakness at home , denies injury , hypotensive at triage BP= 73/57.    Allergies Allergies  Allergen Reactions   Penicillins Hives    Has patient had a PCN reaction causing immediate rash, facial/tongue/throat swelling, SOB or lightheadedness with hypotension: Yes Has patient had a PCN reaction causing severe rash involving mucus membranes or skin necrosis: No Has patient had a PCN reaction that required hospitalization: No Has patient had a PCN reaction occurring within the last 10 years: No If all of the above answers are "NO", then may proceed with Cephalosporin use.    Bacitracin Itching, Swelling and Rash   Septra [Sulfamethoxazole-Trimethoprim] Itching, Swelling and Rash    Level of Care/Admitting Diagnosis ED Disposition     ED Disposition  Admit   Condition  --   Comment  Hospital [facility] [[id number]]  Level of Care: Med-Surg [16]  May place patient in observation at [facility] or [facility] if equivalent level of care is available:: No  Covid Evaluation: Asymptomatic - no recent exposure (last 10 days) testing not required  Diagnosis: AKI (acute kidney injury) [facility]) [[id number]]  Admitting Physician: Charlsie Quest [[id number]]  Attending Physician: Charlsie Quest [[id number]]          B Medical/Surgery History Past Medical History:  Diagnosis Date   Acid reflux    Acne rosacea    Diabetes mellitus without complication (HCC) Dx 2010   DVT (deep venous thrombosis) (HCC) 2012 and 2013     Left and Right leg, no history of PE     Hyperlipidemia Dx 2010   Hypertension Dx 2010   Mood disorder (HCC)    Plantar fasciitis    Substance abuse (HCC)    last alchohol intake 01/24/2013   Thyroid disease    Past Surgical History:  Procedure Laterality Date   PACEMAKER INSERTION  2014   TEE WITHOUT CARDIOVERSION N/A 02/22/2016   Procedure: TRANSESOPHAGEAL ECHOCARDIOGRAM (TEE);  Surgeon: Chilton Si, MD;  Location: [facility] ENDOSCOPY;  Service: Cardiovascular;  Laterality: N/A;     A IV Location/Drains/Wounds Patient Lines/Drains/Airways Status     Active Line/Drains/Airways     Name Placement date Placement time Site Days   Peripheral IV 07/04/23 18 G 1.16" Anterior;Right Forearm 07/04/23  2049  Forearm  1            Intake/Output Last 24 hours  Intake/Output Summary (Last 24 hours) at 07/05/2023 0114 Last data filed at 07/04/2023 2152 Gross per 24 hour  Intake 1000 ml  Output --  Net 1000 ml    Labs/Imaging Results for orders placed or performed during the hospital encounter of 07/04/23 (from the past 48 hour(s))  CBG monitoring, ED     Status: None   Collection Time: 07/04/23  8:20 PM  Result Value Ref Range   Glucose-Capillary 95 70 - 99 mg/dL    Comment: Glucose reference range applies only to samples taken after fasting for at least 8 hours.  SARS Coronavirus 2 by RT PCR (hospital order, performed in [facility]  hospital lab) *cepheid single result test* Anterior Nasal Swab     Status: None   Collection Time: 07/04/23  8:30 PM   Specimen: Anterior Nasal Swab  Result Value Ref Range   SARS Coronavirus 2 by RT PCR NEGATIVE NEGATIVE    Comment: Performed at [facility], [address] [place], [place] [address]  Comprehensive metabolic panel     Status: Abnormal   Collection Time: 07/04/23  8:36 PM  Result Value Ref Range   Sodium 131 (L) 135 - 145 mmol/L   Potassium 4.4 3.5 - 5.1 mmol/L   Chloride 94 (L) 98 - 111 mmol/L   CO2 19 (L) 22 - 32 mmol/L    Glucose, Bld 100 (H) 70 - 99 mg/dL    Comment: Glucose reference range applies only to samples taken after fasting for at least 8 hours.   BUN 21 8 - 23 mg/dL   Creatinine, Ser 6.04 (H) 0.61 - 1.24 mg/dL   Calcium 54.0 (H) 8.9 - 10.3 mg/dL   Total Protein 7.5 6.5 - 8.1 g/dL   Albumin 3.7 3.5 - 5.0 g/dL   AST 42 (H) 15 - 41 U/L   ALT 25 0 - 44 U/L   Alkaline Phosphatase 290 (H) 38 - 126 U/L   Total Bilirubin 0.6 0.3 - 1.2 mg/dL   GFR, Estimated 41 (L) >60 mL/min    Comment: (NOTE) Calculated using the CKD-EPI Creatinine Equation (2021)    Anion gap 18 (H) 5 - 15    Comment: Performed at [facility] Lab, [address] [place], [place] [address]  CBC with Differential     Status: Abnormal   Collection Time: 07/04/23  8:36 PM  Result Value Ref Range   WBC 8.5 4.0 - 10.5 K/uL   RBC 3.23 (L) 4.22 - 5.81 MIL/uL   Hemoglobin 10.6 (L) 13.0 - 17.0 g/dL   HCT 14.7 (L) 82.9 - 56.2 %   MCV 97.2 80.0 - 100.0 fL   MCH 32.8 26.0 - 34.0 pg   MCHC 33.8 30.0 - 36.0 g/dL   RDW 13.0 86.5 - 78.4 %   Platelets 116 (L) 150 - 400 K/uL    Comment: REPEATED TO VERIFY   nRBC 0.0 0.0 - 0.2 %   Neutrophils Relative % 52 %   Neutro Abs 4.4 1.7 - 7.7 K/uL   Lymphocytes Relative 42 %   Lymphs Abs 3.5 0.7 - 4.0 K/uL   Monocytes Relative 5 %   Monocytes Absolute 0.4 0.1 - 1.0 K/uL   Eosinophils Relative 0 %   Eosinophils Absolute 0.0 0.0 - 0.5 K/uL   Basophils Relative 1 %   Basophils Absolute 0.1 0.0 - 0.1 K/uL   Immature Granulocytes 0 %   Abs Immature Granulocytes 0.02 0.00 - 0.07 K/uL    Comment: Performed at [facility], [address], [place], [place] [address]  Troponin I (High Sensitivity)     Status: None   Collection Time: 07/04/23  8:36 PM  Result Value Ref Range   Troponin I (High Sensitivity) 12 <18 ng/L    Comment: (NOTE) Elevated high sensitivity troponin I (hsTnI) values and significant  changes across serial measurements may suggest ACS but many other  chronic and acute  conditions are known to elevate hsTnI results.  Refer to the "Links" section for chest pain algorithms and additional  guidance. Performed at [facility], [address] [place], [place] [address]   ABO/Rh  Status: None   Collection Time: 07/04/23  8:36 PM  Result Value Ref Range   ABO/RH(D)      O POS Performed at [facility], [address], [place], [place] [address]   CBG monitoring, ED     Status: None   Collection Time: 07/04/23  8:44 PM  Result Value Ref Range   Glucose-Capillary 98 70 - 99 mg/dL    Comment: Glucose reference range applies only to samples taken after fasting for at least 8 hours.  Type and screen [facility]     Status: None   Collection Time: 07/04/23  8:50 PM  Result Value Ref Range   ABO/RH(D) O POS    Antibody Screen NEG    Sample Expiration      07/07/2023,2359 Performed at [facility] Lab, [address] [place], [place] [address]   Ethanol     Status: Abnormal   Collection Time: 07/04/23  8:50 PM  Result Value Ref Range   Alcohol, Ethyl (B) 323 (HH) <10 mg/dL    Comment: CRITICAL RESULT CALLED TO, READ BACK BY AND VERIFIED WITH Gabriella Woodhead, K. RN @ 2230 07/04/23 JBUTLER (NOTE) Lowest detectable limit for serum alcohol is 10 mg/dL.  For medical purposes only. Performed at [facility], [address] [place], [place] [address]   Protime-INR     Status: None   Collection Time: 07/04/23 10:29 PM  Result Value Ref Range   Prothrombin Time 14.2 11.4 - 15.2 seconds   INR 1.1 0.8 - 1.2    Comment: (NOTE) INR goal varies based on device and disease states. Performed at [facility] Lab, [address] [place], [place] [address]   Troponin I (High Sensitivity)     Status: None   Collection Time: 07/04/23 10:29 PM  Result Value Ref Range   Troponin I (High Sensitivity) 9 <18 ng/L    Comment: (NOTE) Elevated high sensitivity troponin I (hsTnI) values and significant  changes across serial measurements  may suggest ACS but many other  chronic and acute conditions are known to elevate hsTnI results.  Refer to the "Links" section for chest pain algorithms and additional  guidance. Performed at [facility], [address] [place], [place] [address]   Urine rapid drug screen (hosp performed)     Status: Abnormal   Collection Time: 07/05/23 12:37 AM  Result Value Ref Range   Opiates NONE DETECTED NONE DETECTED   Cocaine NONE DETECTED NONE DETECTED   Benzodiazepines NONE DETECTED NONE DETECTED   Amphetamines NONE DETECTED NONE DETECTED   Tetrahydrocannabinol POSITIVE (A) NONE DETECTED   Barbiturates NONE DETECTED NONE DETECTED    Comment: (NOTE) DRUG SCREEN FOR MEDICAL PURPOSES ONLY.  IF CONFIRMATION IS NEEDED FOR ANY PURPOSE, NOTIFY LAB WITHIN 5 DAYS.  LOWEST DETECTABLE LIMITS FOR URINE DRUG SCREEN Drug Class                     Cutoff (ng/mL) Amphetamine and metabolites    1000 Barbiturate and metabolites    200 Benzodiazepine                 200 Opiates and metabolites        300 Cocaine and metabolites        300 THC                            50 Performed  at [facility] Lab, [address] [place], [place] [address]   Urinalysis, w/ Reflex to Culture (Infection Suspected) -Urine, Clean Catch     Status: Abnormal   Collection Time: 07/05/23 12:37 AM  Result Value Ref Range   Specimen Source URINE, CLEAN CATCH    Color, Urine YELLOW YELLOW   APPearance HAZY (A) CLEAR   Specific Gravity, Urine 1.009 1.005 - 1.030   pH 5.0 5.0 - 8.0   Glucose, UA NEGATIVE NEGATIVE mg/dL   Hgb urine dipstick NEGATIVE NEGATIVE   Bilirubin Urine NEGATIVE NEGATIVE   Ketones, ur NEGATIVE NEGATIVE mg/dL   Protein, ur NEGATIVE NEGATIVE mg/dL   Nitrite NEGATIVE NEGATIVE   Leukocytes,Ua LARGE (A) NEGATIVE   RBC / HPF 0-5 0 - 5 RBC/hpf   WBC, UA 11-20 0 - 5 WBC/hpf    Comment:        Reflex urine culture not performed if WBC <=10, OR if Squamous epithelial cells >5. If Squamous  epithelial cells >5 suggest recollection.    Bacteria, UA MANY (A) NONE SEEN   Squamous Epithelial / HPF 0-5 0 - 5 /HPF   Mucus PRESENT    Hyaline Casts, UA PRESENT     Comment: Performed at [facility] Lab, [address] [place], [place] [address]   DG Chest Port 1 View  Result Date: 07/04/2023 CLINICAL DATA:  Upper back pain and weakness EXAM: PORTABLE CHEST 1 VIEW COMPARISON:  Chest x-ray 09/17/2020 FINDINGS: Left-sided pacemaker is unchanged in position. The heart size and mediastinal contours are within normal limits. Both lungs are clear. The visualized skeletal structures are unremarkable. IMPRESSION: No active disease. Electronically Signed   By: Darliss Cheney M.D.   On: 07/04/2023 21:25    Pending Labs Unresulted Labs (From admission, onward)     Start     Ordered   07/05/23 0500  HIV Antibody (routine testing w rflx)  (HIV Antibody (Routine testing w reflex) panel)  Tomorrow morning,   R        07/05/23 0020   07/05/23 0500  CBC  Tomorrow morning,   R        07/05/23 0020   07/05/23 0500  Basic metabolic panel  Tomorrow morning,   R        07/05/23 0020   07/05/23 0037  Urine Culture  Once,   R        07/05/23 0037            Vitals/Pain Today's Vitals   07/04/23 2130 07/04/23 2145 07/04/23 2230 07/05/23 0045  BP: 97/69 120/80 111/76 (!) 135/97  Pulse: 86 86 89 89  Resp: 19 20 16 17   Temp:      SpO2: (!) 83% 100% 100% 100%  PainSc:        Isolation Precautions No active isolations  Medications Medications  lactated ringers infusion (has no administration in time range)  enoxaparin (LOVENOX) injection 30 mg (has no administration in time range)  acetaminophen (TYLENOL) tablet 650 mg (has no administration in time range)    Or  acetaminophen (TYLENOL) suppository 650 mg (has no administration in time range)  ondansetron (ZOFRAN) tablet 4 mg (has no administration in time range)    Or  ondansetron (ZOFRAN) injection 4 mg (has no administration in time  range)  senna-docusate (Senokot-S) tablet 1 tablet (has no administration in time range)  LORazepam (ATIVAN) tablet 1-4 mg (has no administration in time range)    Or  LORazepam (ATIVAN)  injection 1-4 mg (has no administration in time range)  thiamine (VITAMIN B1) tablet 100 mg (has no administration in time range)    Or  thiamine (VITAMIN B1) injection 100 mg (has no administration in time range)  folic acid (FOLVITE) tablet 1 mg (has no administration in time range)  multivitamin with minerals tablet 1 tablet (has no administration in time range)  insulin aspart (novoLOG) injection 0-9 Units (has no administration in time range)  sodium chloride 0.9 % bolus 1,000 mL (0 mLs Intravenous Stopped 07/04/23 2152)  LORazepam (ATIVAN) injection 1 mg (1 mg Intravenous Given 07/04/23 2212)    Mobility walks     Focused Assessments CIWAW\   R Recommendations: See Admitting Provider Note  Report given to:   Additional Notes:

## 2023-07-05 NOTE — TOC Transition Note (Signed)
Transition of Care [facility]) - CM/SW Discharge Note   Patient Details  Name: Patrick Small MRN: [id number] Date of Birth: August 22, 1961  Transition of Care [facility]) CM/SW Contact:  Lawerance Sabal, RN Phone Number: 07/05/2023, 11:04 AM   Clinical Narrative:     Discussed DC plan w MD PT and OT. Rollator ordered through [employer] to be delivered to the room  Final next level of care: Home/Self Care Barriers to Discharge: No Barriers Identified   Patient Goals and CMS Choice      Discharge Placement                         Discharge Plan and Services Additional resources added to the After Visit Summary for                  DME Arranged: Walker rolling with seat DME Agency: [employer] Date DME Agency Contacted: 07/05/23 Time DME Agency Contacted: 1104 Representative spoke with at DME Agency: Vaughan Basta            Social Determinants of Health (SDOH) Interventions SDOH Screenings   Food Insecurity: No Food Insecurity (07/05/2023)  Housing: Low Risk  (07/05/2023)  Transportation Needs: No Transportation Needs (07/05/2023)  Utilities: Not At Risk (07/05/2023)  Depression (PHQ2-9): Low Risk  (01/12/2020)  Tobacco Use: High Risk (07/05/2023)     Readmission Risk Interventions     No data to display

## 2023-07-05 NOTE — Evaluation (Signed)
Physical Therapy Evaluation and Discharge Patient Details Name: Patrick Small MRN: [id number] DOB: 1961-07-13 Today's Date: 07/05/2023  History of Present Illness  62 y.o. male with medical history significant for SSS s/p ICD, T2DM, HTN, HLD, hypothyroidism, cervical and lumbar DDD with chronic back pain, depression, polysubstance use (alcohol, cocaine, tobacco) who presented to the ED from home for evaluation of generalized weakness and fatigue. He was noted to be intoxicated from alcohol use and was given some IV fluids for AKI which has improved.  Clinical Impression   Patient evaluated by Physical Therapy with no further acute PT needs identified. All education has been completed and the patient has no further questions. Patient educated on use of rollator and return demonstrated safe use. Case Manager aware of order in chart and working to get one for pt.  See below for any follow-up Physical Therapy or equipment needs. PT is signing off. Thank you for this referral.         Assistance Recommended at Discharge PRN  If plan is discharge home, recommend the following:  Can travel by private vehicle  Assistance with cooking/housework;Assist for transportation        Equipment Recommendations Rollator (4 wheels) (pt prefers rollator to RW for use with longer distances and having a place to sit/rest)  Recommendations for Other Services       Functional Status Assessment Patient has had a recent decline in their functional status and demonstrates the ability to make significant improvements in function in a reasonable and predictable amount of time.     Precautions / Restrictions Precautions Precautions: Fall Precaution Comments: Has a back brace in his room for mobility beyond the bathroom Required Braces or Orthoses: Spinal Brace Spinal Brace: Thoracolumbosacral orthotic Restrictions Weight Bearing Restrictions: No      Mobility  Bed Mobility Overal bed mobility: Modified  Independent                  Transfers Overall transfer level: Needs assistance Equipment used: Rollator (4 wheels) Transfers: Sit to/from Stand Sit to Stand: Supervision           General transfer comment: vc for proper use of RW; pt also performed transfer on/off seat of rollator without difficulty    Ambulation/Gait Ambulation/Gait assistance: Modified independent (Device/Increase time) Gait Distance (Feet): 170 Feet Assistive device: Rollator (4 wheels) Gait Pattern/deviations: Step-through pattern, Trunk flexed   Gait velocity interpretation: >2.62 ft/sec, indicative of community ambulatory   General Gait Details: vc for upright posture and proximity to rollator; pt then maintained position remainder of time  Stairs            Wheelchair Mobility     Tilt Bed    Modified Rankin (Stroke Patients Only)       Balance Overall balance assessment: Mild deficits observed, not formally tested                                           Pertinent Vitals/Pain Pain Assessment Pain Assessment: Faces Faces Pain Scale: Hurts even more Pain Location: B knees, back Pain Descriptors / Indicators: Tender, Throbbing, Sharp, Grimacing Pain Intervention(s): Limited activity within patient's tolerance, Monitored during session, Premedicated before session    Home Living Family/patient expects to be discharged to:: Private residence Living Arrangements: Alone Available Help at Discharge: Family;Available PRN/intermittently Type of Home: Apartment Home Access: Elevator  Home Layout: One level Home Equipment: None      Prior Function Prior Level of Function : Needs assist             Mobility Comments: Walks without an AD, furniture walks depending on back pain, would benefit from a RW ADLs Comments: Completes his own ADL, meal prep and light home management.  Does not drive.  Manages his medications.  Sister assists with groceries  and community mobility.     Hand Dominance   Dominant Hand: Right    Extremity/Trunk Assessment   Upper Extremity Assessment Upper Extremity Assessment: Overall WFL for tasks assessed    Lower Extremity Assessment Lower Extremity Assessment: Overall WFL for tasks assessed    Cervical / Trunk Assessment Cervical / Trunk Assessment: Kyphotic  Communication   Communication: No difficulties  Cognition Arousal/Alertness: Awake/alert Behavior During Therapy: WFL for tasks assessed/performed Overall Cognitive Status: Within Functional Limits for tasks assessed                                          General Comments      Exercises     Assessment/Plan    PT Assessment Patient does not need any further PT services  PT Problem List         PT Treatment Interventions      PT Goals (Current goals can be found in the Care Plan section)  Acute Rehab PT Goals Patient Stated Goal: return home with rollator to help him be safer PT Goal Formulation: All assessment and education complete, DC therapy    Frequency       Co-evaluation               AM-PAC PT "6 Clicks" Mobility  Outcome Measure Help needed turning from your back to your side while in a flat bed without using bedrails?: None Help needed moving from lying on your back to sitting on the side of a flat bed without using bedrails?: None Help needed moving to and from a bed to a chair (including a wheelchair)?: None Help needed standing up from a chair using your arms (e.g., wheelchair or bedside chair)?: None Help needed to walk in hospital room?: None Help needed climbing 3-5 steps with a railing? : A Little 6 Click Score: 23    End of Session Equipment Utilized During Treatment: Gait belt;Back brace Activity Tolerance: Patient tolerated treatment well Patient left: in bed;with call bell/phone within reach   PT Visit Diagnosis: Other abnormalities of gait and mobility (R26.89)     Time: 1610-9604 PT Time Calculation (min) (ACUTE ONLY): 12 min   Charges:   PT Evaluation $PT Eval Low Complexity: 1 Low   PT General Charges $$ ACUTE PT VISIT: 1 Visit          Patrick Small, PT Acute Rehabilitation Services  Office [phone]   Patrick Small 07/05/2023, 11:10 AM
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
## 2023-07-05 NOTE — Plan of Care (Signed)

## 2023-07-05 NOTE — Evaluation (Signed)
Occupational Therapy Evaluation Patient Details Name: Patrick Small MRN: [id number] DOB: Mar 13, 1961 Today's Date: 07/05/2023   History of Present Illness 62 y.o. male with medical history significant for SSS s/p ICD, T2DM, HTN, HLD, hypothyroidism, cervical and lumbar DDD with chronic back pain, depression, polysubstance use (alcohol, cocaine, tobacco) who presented to the ED from home for evaluation of generalized weakness and fatigue.   Clinical Impression   Patient admitted for the diagnosis above.  PTA he lives alone in his apartment with an Engineer, structural.  Patient does need community mobility assist from his sister, but otherwise is close to his baseline.  Biggest deficit is bilateral knee pain from a recent fall, chronic back pain, and rib pain associated with the same fall.  Patient is able to move around in his room without assist (furniture walks), and complete his ADL from a sit to stand level.  He would benefit from a 2WRW at home.  OT can follow in the acute setting, but no post acute OT is anticipated.       Recommendations for follow up therapy are one component of a multi-disciplinary discharge planning process, led by the attending physician.  Recommendations may be updated based on patient status, additional functional criteria and insurance authorization.   Assistance Recommended at Discharge PRN  Patient can return home with the following Assist for transportation;Assistance with cooking/housework    Functional Status Assessment  Patient has had a recent decline in their functional status and demonstrates the ability to make significant improvements in function in a reasonable and predictable amount of time.  Equipment Recommendations  Other (comment) (2 wheel rolling walker)    Recommendations for Other Services       Precautions / Restrictions Precautions Precautions: Fall Precaution Comments: Has a back brace in his room for mobility beyond the bathroom Required Braces  or Orthoses: Spinal Brace Spinal Brace: Lumbar corset Restrictions Weight Bearing Restrictions: No      Mobility Bed Mobility Overal bed mobility: Modified Independent                  Transfers Overall transfer level: Needs assistance Equipment used: None Transfers: Sit to/from Stand, Bed to chair/wheelchair/BSC Sit to Stand: Modified independent (Device/Increase time), Supervision     Step pivot transfers: Supervision            Balance Overall balance assessment: Mild deficits observed, not formally tested                                         ADL either performed or assessed with clinical judgement   ADL Overall ADL's : At baseline                                       General ADL Comments: Generalized supervision due to knee and chronic back pain     Vision Patient Visual Report: No change from baseline       Perception     Praxis      Pertinent Vitals/Pain Pain Assessment Pain Assessment: Faces Faces Pain Scale: Hurts even more Pain Location: B knees Pain Descriptors / Indicators: Tender, Throbbing, Sharp, Grimacing Pain Intervention(s): Monitored during session     Hand Dominance Right   Extremity/Trunk Assessment Upper Extremity Assessment Upper Extremity Assessment: Overall WFL for tasks assessed  Lower Extremity Assessment Lower Extremity Assessment: Defer to PT evaluation   Cervical / Trunk Assessment Cervical / Trunk Assessment: Kyphotic   Communication Communication Communication: No difficulties   Cognition Arousal/Alertness: Awake/alert Behavior During Therapy: WFL for tasks assessed/performed Overall Cognitive Status: Within Functional Limits for tasks assessed                                       General Comments   VSS on RA    Exercises     Shoulder Instructions      Home Living Family/patient expects to be discharged to:: Private residence Living  Arrangements: Alone Available Help at Discharge: Family;Available PRN/intermittently Type of Home: Apartment Home Access: Elevator     Home Layout: One level     Bathroom Shower/Tub: Chief Strategy Officer: Standard Bathroom Accessibility: Yes How Accessible: Accessible via walker Home Equipment: None          Prior Functioning/Environment Prior Level of Function : Needs assist             Mobility Comments: Walks without an AD, furniture walks depending on back pain, would benefit from a RW ADLs Comments: Completes his own ADL, meal prep and light home management.  Does not drive.  Manages his medications.  Sister assists with groceries and community mobility.        OT Problem List: Decreased range of motion;Decreased activity tolerance;Impaired balance (sitting and/or standing);Pain      OT Treatment/Interventions: Self-care/ADL training;Therapeutic activities;Patient/family education;Balance training;DME and/or AE instruction    OT Goals(Current goals can be found in the care plan section) Acute Rehab OT Goals Patient Stated Goal: Go home OT Goal Formulation: With patient Time For Goal Achievement: 07/20/23 Potential to Achieve Goals: Good  OT Frequency: Min 2X/week    Co-evaluation              AM-PAC OT "6 Clicks" Daily Activity     Outcome Measure Help from another person eating meals?: None Help from another person taking care of personal grooming?: None Help from another person toileting, which includes using toliet, bedpan, or urinal?: A Little Help from another person bathing (including washing, rinsing, drying)?: A Little Help from another person to put on and taking off regular upper body clothing?: None Help from another person to put on and taking off regular lower body clothing?: A Little 6 Click Score: 21   End of Session Nurse Communication: Mobility status  Activity Tolerance: Patient limited by pain Patient left: in  bed;with call bell/phone within reach;with nursing/sitter in room  OT Visit Diagnosis: Unsteadiness on feet (R26.81);Pain Pain - part of body: Knee                Time: 1000-1022 OT Time Calculation (min): 22 min Charges:  OT General Charges $OT Visit: 1 Visit OT Evaluation $OT Eval Moderate Complexity: 1 Mod  07/05/2023  RP, OTR/L  Acute Rehabilitation Services  Office:  [phone]   Patrick Small 07/05/2023, 10:29 AM

## 2023-07-06 ENCOUNTER — Telehealth: Payer: Self-pay

## 2023-07-06 LAB — URINE CULTURE: Culture: >=100000 — AB

## 2023-07-06 NOTE — Transitions of Care (Post Inpatient/ED Visit) (Signed)
   07/06/2023  Name: Patrick Small MRN: [id number] DOB: December 03, 1961  Today's TOC FU Call Status: Today's TOC FU Call Status:: Unsuccessul Call (1st Attempt) Unsuccessful Call (1st Attempt) Date: 07/06/23  Attempted to reach the patient regarding the most recent Inpatient/ED visit.  Follow Up Plan: Additional outreach attempts will be made to reach the patient to complete the Transitions of Care (Post Inpatient/ED visit) call.   We need to confirm if the patient as a new PCP.  He has not been seen at [facility] since 01/2020.  Signature  Robyne Peers, RN

## 2023-07-07 ENCOUNTER — Telehealth: Payer: Self-pay | Admitting: Internal Medicine

## 2023-07-07 ENCOUNTER — Telehealth: Payer: Self-pay

## 2023-07-07 NOTE — Telephone Encounter (Signed)
Phone call placed to patient x 2 today to go over results of urine culture that I received from his recent hospitalization.  I got an automated voicemail.  I did not leave a message.  Will send him a letter.

## 2023-07-07 NOTE — Transitions of Care (Post Inpatient/ED Visit) (Signed)
   07/07/2023  Name: Patrick Small MRN: [id number] DOB: 1961/12/06  Today's TOC FU Call Status: Today's TOC FU Call Status:: Unsuccessful Call (2nd Attempt) Unsuccessful Call (1st Attempt) Date: 07/06/23 Unsuccessful Call (2nd Attempt) Date: 07/07/23  Attempted to reach the patient regarding the most recent Inpatient/ED visit.  Follow Up Plan: Additional outreach attempts will be made to reach the patient to complete the Transitions of Care (Post Inpatient/ED visit) call.   We need to confirm if the patient as a new PCP.  He has not been seen at [facility] since 01/2020.     Signature Robyne Peers, RN

## 2023-07-08 ENCOUNTER — Telehealth: Payer: Self-pay

## 2023-07-08 NOTE — Transitions of Care (Post Inpatient/ED Visit) (Signed)
07/08/2023  Name: Patrick Small MRN: [id number] DOB: 1961/04/07  Today's TOC FU Call Status: Today's TOC FU Call Status:: Unsuccessul Call (1st Attempt) Unsuccessful Call (1st Attempt) Date: 07/06/23 Unsuccessful Call (2nd Attempt) Date: 07/07/23 [facility] FU Call Complete Date: 07/08/23  Transition Care Management Follow-up Telephone Call Date of Discharge: 07/05/23 Discharge Facility: [facility]) Type of Discharge: Inpatient Admission Primary Inpatient Discharge Diagnosis:: AKI How have you been since you were released from the hospital?: Better (he said he is better but still has pain in his back and knees) Any questions or concerns?: No  Items Reviewed: Did you receive and understand the discharge instructions provided?: Yes Medications obtained,verified, and reconciled?: No Medications Not Reviewed Reasons:: Other: (He said he has all of his medications and nothing is new. and he did not have any questions about the meds or need to review the med list. He also has a glucometer) Any new allergies since your discharge?: No Dietary orders reviewed?: Yes Type of Diet Ordered:: heart healthy diabetic Do you have support at home?: No (He said he lives alone and does not really have anyone who can assist him.)  Medications Reviewed Today: Medications Reviewed Today     Reviewed by Rulon Sera, CPhT (Pharmacy Technician) on 07/05/23 at [phone]  Med List Status: F/U 2nd Shift   Medication Order Taking? Sig Documenting Provider Last Dose Status Informant  ACCU-CHEK AVIVA PLUS test strip [id number]  Use as instructed 3 times daily. Check blood sugar fasting and before meals and again if pt feels bad (symptoms of hypo). Marcine Matar, MD  Active Self  Accu-Chek Softclix Lancets lancets [id number]  USE AS DIRECTED FOUR TIMES DAILY Marcine Matar, MD  Active   calcium carbonate (OS-CAL) 1250 (500 Ca) MG chewable tablet [id number] Yes Chew 1 tablet by mouth daily. Pt does not know  dose [provider] 07/04/2023 0700 Active   cyclobenzaprine (FLEXERIL) 5 MG tablet [id number]  Take 5 mg by mouth 3 (three) times daily as needed. [provider]  Active   DULoxetine (CYMBALTA) 20 MG capsule [id number] Yes Take 1 capsule (20 mg total) by mouth daily. For depression Marcine Matar, MD 07/04/2023 0700 Active   gabapentin (NEURONTIN) 300 MG capsule [id number] Yes Take 1 capsule (300 mg total) by mouth 2 (two) times daily. Marcine Matar, MD 07/04/2023 Active   LANTUS SOLOSTAR 100 UNIT/ML Solostar Pen [id number]  Inject 15 Units into the skin daily at 10 pm. Marcine Matar, MD  Active   levothyroxine (SYNTHROID) 50 MCG tablet [id number] Yes TAKE ONE TABLET BY MOUTH DAILY BEFORE Illene Labrador, MD 07/04/2023 0700 Active   lisinopril (ZESTRIL) 5 MG tablet [id number] Yes Take 1 tablet (5 mg total) by mouth daily. Marcine Matar, MD 07/04/2023 0700 Active Self  loratadine (CLARITIN) 10 MG tablet [id number] Yes Take 1 tablet (10 mg total) by mouth daily. Marcine Matar, MD 07/04/2023 0700 Active   losartan (COZAAR) 25 MG tablet [id number]  Take 25 mg by mouth daily. [provider]  Active   meloxicam (MOBIC) 15 MG tablet [id number] Yes Take 1 tablet (15 mg total) by mouth daily. Marcine Matar, MD 07/04/2023 0700 Active   metFORMIN (GLUCOPHAGE) 500 MG tablet [id number] Yes Take by mouth 2 (two) times daily with a meal. Pt does not know dose [provider] 07/04/2023 0700 Active   omeprazole (PRILOSEC) 20 MG capsule [id number] Yes Take 1 capsule (20 mg total) by mouth daily. Marcine Matar,  MD 07/04/2023 0700 Active   rosuvastatin (CRESTOR) 20 MG tablet [id number]  Take 20 mg by mouth daily. [provider]  Active   traZODone (DESYREL) 100 MG tablet [id number]  Take 100 mg by mouth at bedtime. [provider]  Active             Home Care and Equipment/Supplies: Were Home Health Services Ordered?: No Any new  equipment or medical supplies ordered?: Yes Name of Medical supply agency?: [employer] Were you able to get the equipment/medical supplies?: Yes Do you have any questions related to the use of the equipment/supplies?: No  Functional Questionnaire: Do you need assistance with bathing/showering or dressing?: No Do you need assistance with meal preparation?: No Do you need assistance with eating?: No Do you have difficulty maintaining continence: No Do you need assistance with getting out of bed/getting out of a chair/moving?: Yes (He ambulates with a rollator) Do you have difficulty managing or taking your medications?: No  Follow up appointments reviewed: PCP Follow-up appointment confirmed?: Yes Date of PCP follow-up appointment?: 07/23/23 Follow-up Provider: Kennith Maes, PA- [facility] Follow-up appointment confirmed?: Yes Date of Specialist follow-up appointment?: 07/09/23 Follow-Up Specialty Provider:: endocrinology, 07/24/2023- GI, 07/27/2023- PT, 08/04/2023- ophthalmology Do you need transportation to your follow-up appointment?: No (He stated he uses rides arranged by his insurance company) Do you understand care options if your condition(s) worsen?: Yes-patient verbalized understanding    SIGNATURE Robyne Peers, RN

## 2023-07-08 NOTE — Telephone Encounter (Signed)
noted 

## 2023-07-08 NOTE — Telephone Encounter (Signed)
Dr Laural Benes- I see you tried to reach this patient.  I was able to speak with him this morning. He has a  PCP with [facility] in [facility] and has an appt with Kennith Maes, PA on 07/23/2023.   He said he is going to continue to see the provider in [facility] as that is where he is living and he does not want to come to [facility]

## 2023-11-25 ENCOUNTER — Other Ambulatory Visit: Payer: Self-pay | Admitting: Student

## 2023-11-25 DIAGNOSIS — R748 Abnormal levels of other serum enzymes: Secondary | ICD-10-CM

## 2023-12-18 ENCOUNTER — Other Ambulatory Visit: Payer: MEDICAID

## 2024-02-08 ENCOUNTER — Inpatient Hospital Stay: Admission: RE | Admit: 2024-02-08 | Payer: Medicaid Other | Source: Ambulatory Visit

## 2024-07-26 ENCOUNTER — Other Ambulatory Visit: Payer: Self-pay

## 2024-07-26 ENCOUNTER — Ambulatory Visit
Admission: RE | Admit: 2024-07-26 | Discharge: 2024-07-26 | Disposition: A | Discharge status: Home / Self Care | Source: Ambulatory Visit

## 2024-07-26 DIAGNOSIS — M1712 Unilateral primary osteoarthritis, left knee: Secondary | ICD-10-CM

## 2024-07-26 DIAGNOSIS — M1711 Unilateral primary osteoarthritis, right knee: Secondary | ICD-10-CM

## 2024-07-26 NOTE — Procedures (Signed)
 SABRA
